# Patient Record
Sex: Male | Born: 1965 | Race: White | Hispanic: No | Marital: Single | Smoking: Never smoker
Health system: Southern US, Community
[De-identification: ages and names within clinical notes are randomized; demographics above are authoritative.]

## PROBLEM LIST (undated history)

## (undated) DIAGNOSIS — D518 Other vitamin B12 deficiency anemias: Principal | ICD-10-CM

## (undated) DIAGNOSIS — K603 Anal fistula, unspecified: Secondary | ICD-10-CM

## (undated) DIAGNOSIS — F419 Anxiety disorder, unspecified: Secondary | ICD-10-CM

## (undated) DIAGNOSIS — Z8719 Personal history of other diseases of the digestive system: Secondary | ICD-10-CM

## (undated) DIAGNOSIS — F32A Depression, unspecified: Secondary | ICD-10-CM

## (undated) DIAGNOSIS — K589 Irritable bowel syndrome without diarrhea: Secondary | ICD-10-CM

## (undated) DIAGNOSIS — K611 Rectal abscess: Secondary | ICD-10-CM

## (undated) DIAGNOSIS — K5090 Crohn's disease, unspecified, without complications: Secondary | ICD-10-CM

## (undated) DIAGNOSIS — F329 Major depressive disorder, single episode, unspecified: Secondary | ICD-10-CM

## (undated) DIAGNOSIS — E559 Vitamin D deficiency, unspecified: Secondary | ICD-10-CM

## (undated) HISTORY — DX: Depression, unspecified: F32.A

## (undated) HISTORY — PX: OTHER SURGICAL HISTORY: SHX169

## (undated) HISTORY — DX: Major depressive disorder, single episode, unspecified: F32.9

## (undated) HISTORY — DX: Personal history of other diseases of the digestive system: Z87.19

## (undated) HISTORY — DX: Irritable bowel syndrome, unspecified: K58.9

## (undated) HISTORY — DX: Crohn's disease, unspecified, without complications: K50.90

## (undated) HISTORY — DX: Anal fistula: K60.3

## (undated) HISTORY — DX: Anxiety disorder, unspecified: F41.9

## (undated) HISTORY — DX: Anal fistula, unspecified: K60.30

## (undated) HISTORY — PX: ADENOIDECTOMY: SUR15

## (undated) HISTORY — DX: Rectal abscess: K61.1

## (undated) HISTORY — DX: Vitamin D deficiency, unspecified: E55.9

## (undated) HISTORY — DX: Other vitamin B12 deficiency anemias: D51.8

---

## 1997-01-04 HISTORY — PX: SMALL INTESTINE SURGERY: SHX150

## 1998-09-08 ENCOUNTER — Encounter: Payer: Self-pay | Admitting: Emergency Medicine

## 1998-09-08 ENCOUNTER — Emergency Department (HOSPITAL_COMMUNITY): Admission: EM | Admit: 1998-09-08 | Discharge: 1998-09-08 | Payer: Self-pay | Admitting: Emergency Medicine

## 2000-07-02 ENCOUNTER — Inpatient Hospital Stay (HOSPITAL_COMMUNITY): Admission: EM | Admit: 2000-07-02 | Discharge: 2000-07-04 | Payer: Self-pay | Admitting: Emergency Medicine

## 2000-07-02 ENCOUNTER — Encounter: Payer: Self-pay | Admitting: Gastroenterology

## 2000-08-13 ENCOUNTER — Ambulatory Visit (HOSPITAL_COMMUNITY): Admission: RE | Admit: 2000-08-13 | Discharge: 2000-08-13 | Payer: Self-pay | Admitting: Gastroenterology

## 2000-08-13 ENCOUNTER — Encounter (INDEPENDENT_AMBULATORY_CARE_PROVIDER_SITE_OTHER): Payer: Self-pay | Admitting: Specialist

## 2000-08-20 ENCOUNTER — Ambulatory Visit (HOSPITAL_COMMUNITY): Admission: RE | Admit: 2000-08-20 | Discharge: 2000-08-20 | Payer: Self-pay | Admitting: Gastroenterology

## 2000-08-20 ENCOUNTER — Encounter: Payer: Self-pay | Admitting: Gastroenterology

## 2000-11-15 ENCOUNTER — Emergency Department (HOSPITAL_COMMUNITY): Admission: EM | Admit: 2000-11-15 | Discharge: 2000-11-15 | Payer: Self-pay

## 2001-03-24 ENCOUNTER — Encounter: Payer: Self-pay | Admitting: Gastroenterology

## 2001-03-24 ENCOUNTER — Ambulatory Visit (HOSPITAL_COMMUNITY): Admission: RE | Admit: 2001-03-24 | Discharge: 2001-03-24 | Payer: Self-pay | Admitting: Gastroenterology

## 2001-10-28 ENCOUNTER — Ambulatory Visit (HOSPITAL_COMMUNITY): Admission: RE | Admit: 2001-10-28 | Discharge: 2001-10-28 | Payer: Self-pay | Admitting: Gastroenterology

## 2001-10-28 ENCOUNTER — Encounter: Payer: Self-pay | Admitting: Gastroenterology

## 2001-12-09 ENCOUNTER — Encounter: Payer: Self-pay | Admitting: Surgery

## 2001-12-16 ENCOUNTER — Inpatient Hospital Stay (HOSPITAL_COMMUNITY): Admission: RE | Admit: 2001-12-16 | Discharge: 2001-12-23 | Payer: Self-pay | Admitting: Surgery

## 2001-12-16 ENCOUNTER — Encounter (INDEPENDENT_AMBULATORY_CARE_PROVIDER_SITE_OTHER): Payer: Self-pay | Admitting: Specialist

## 2001-12-16 ENCOUNTER — Encounter: Payer: Self-pay | Admitting: Otolaryngology

## 2001-12-16 HISTORY — PX: ILEOCECETOMY: SHX5857

## 2003-03-23 ENCOUNTER — Encounter (INDEPENDENT_AMBULATORY_CARE_PROVIDER_SITE_OTHER): Payer: Self-pay | Admitting: Specialist

## 2003-03-23 ENCOUNTER — Ambulatory Visit (HOSPITAL_COMMUNITY): Admission: RE | Admit: 2003-03-23 | Discharge: 2003-03-23 | Payer: Self-pay | Admitting: Gastroenterology

## 2004-03-15 ENCOUNTER — Emergency Department (HOSPITAL_COMMUNITY): Admission: EM | Admit: 2004-03-15 | Discharge: 2004-03-15 | Payer: Self-pay | Admitting: Emergency Medicine

## 2004-04-15 ENCOUNTER — Emergency Department (HOSPITAL_COMMUNITY): Admission: EM | Admit: 2004-04-15 | Discharge: 2004-04-16 | Payer: Self-pay | Admitting: Emergency Medicine

## 2005-12-13 ENCOUNTER — Emergency Department (HOSPITAL_COMMUNITY): Admission: EM | Admit: 2005-12-13 | Discharge: 2005-12-13 | Payer: Self-pay | Admitting: Emergency Medicine

## 2006-11-08 ENCOUNTER — Encounter: Admission: RE | Admit: 2006-11-08 | Discharge: 2006-11-08 | Payer: Self-pay | Admitting: Gastroenterology

## 2007-05-03 ENCOUNTER — Emergency Department (HOSPITAL_COMMUNITY): Admission: EM | Admit: 2007-05-03 | Discharge: 2007-05-03 | Payer: Self-pay | Admitting: Emergency Medicine

## 2009-07-14 ENCOUNTER — Inpatient Hospital Stay (HOSPITAL_COMMUNITY): Admission: EM | Admit: 2009-07-14 | Discharge: 2009-07-17 | Payer: Self-pay | Admitting: Emergency Medicine

## 2010-02-16 ENCOUNTER — Emergency Department (HOSPITAL_COMMUNITY): Admission: EM | Admit: 2010-02-16 | Discharge: 2010-02-16 | Payer: Self-pay | Admitting: Family Medicine

## 2010-07-27 LAB — POCT I-STAT, CHEM 8
BUN: 10 mg/dL (ref 6–23)
Calcium, Ion: 1.13 mmol/L (ref 1.12–1.32)
Chloride: 105 mEq/L (ref 96–112)
Creatinine, Ser: 1.1 mg/dL (ref 0.4–1.5)
Glucose, Bld: 128 mg/dL — ABNORMAL HIGH (ref 70–99)
HCT: 51 % (ref 39.0–52.0)
Hemoglobin: 17.3 g/dL — ABNORMAL HIGH (ref 13.0–17.0)
Potassium: 3.4 mEq/L — ABNORMAL LOW (ref 3.5–5.1)
Sodium: 136 mEq/L (ref 135–145)
TCO2: 27 mmol/L (ref 0–100)

## 2010-07-27 LAB — COMPREHENSIVE METABOLIC PANEL
ALT: 26 U/L (ref 0–53)
ALT: 32 U/L (ref 0–53)
ALT: 40 U/L (ref 0–53)
AST: 20 U/L (ref 0–37)
AST: 23 U/L (ref 0–37)
AST: 27 U/L (ref 0–37)
Albumin: 3.2 g/dL — ABNORMAL LOW (ref 3.5–5.2)
Albumin: 3.2 g/dL — ABNORMAL LOW (ref 3.5–5.2)
Albumin: 3.3 g/dL — ABNORMAL LOW (ref 3.5–5.2)
Alkaline Phosphatase: 44 U/L (ref 39–117)
Alkaline Phosphatase: 46 U/L (ref 39–117)
Alkaline Phosphatase: 53 U/L (ref 39–117)
BUN: 13 mg/dL (ref 6–23)
BUN: 8 mg/dL (ref 6–23)
BUN: 9 mg/dL (ref 6–23)
CO2: 24 mEq/L (ref 19–32)
CO2: 24 mEq/L (ref 19–32)
CO2: 25 mEq/L (ref 19–32)
Calcium: 8.4 mg/dL (ref 8.4–10.5)
Calcium: 8.5 mg/dL (ref 8.4–10.5)
Calcium: 8.6 mg/dL (ref 8.4–10.5)
Chloride: 105 mEq/L (ref 96–112)
Chloride: 105 mEq/L (ref 96–112)
Chloride: 105 mEq/L (ref 96–112)
Creatinine, Ser: 0.96 mg/dL (ref 0.4–1.5)
Creatinine, Ser: 1.02 mg/dL (ref 0.4–1.5)
Creatinine, Ser: 1.14 mg/dL (ref 0.4–1.5)
GFR calc Af Amer: >60 mL/min (ref >60)
GFR calc Af Amer: >60 mL/min (ref >60)
GFR calc Af Amer: >60 mL/min (ref >60)
GFR calc non Af Amer: >60 mL/min (ref >60)
GFR calc non Af Amer: >60 mL/min (ref >60)
GFR calc non Af Amer: >60 mL/min (ref >60)
Glucose, Bld: 121 mg/dL — ABNORMAL HIGH (ref 70–99)
Glucose, Bld: 150 mg/dL — ABNORMAL HIGH (ref 70–99)
Glucose, Bld: 152 mg/dL — ABNORMAL HIGH (ref 70–99)
Potassium: 3.4 mEq/L — ABNORMAL LOW (ref 3.5–5.1)
Potassium: 3.6 mEq/L (ref 3.5–5.1)
Potassium: 3.9 mEq/L (ref 3.5–5.1)
Sodium: 135 mEq/L (ref 135–145)
Sodium: 137 mEq/L (ref 135–145)
Sodium: 139 mEq/L (ref 135–145)
Total Bilirubin: 0.6 mg/dL (ref 0.3–1.2)
Total Bilirubin: 0.6 mg/dL (ref 0.3–1.2)
Total Bilirubin: 1 mg/dL (ref 0.3–1.2)
Total Protein: 6.7 g/dL (ref 6.0–8.3)
Total Protein: 6.7 g/dL (ref 6.0–8.3)
Total Protein: 7.1 g/dL (ref 6.0–8.3)

## 2010-07-27 LAB — URINALYSIS, MICROSCOPIC ONLY
Bilirubin Urine: NEGATIVE
Glucose, UA: NEGATIVE mg/dL
Hgb urine dipstick: NEGATIVE
Ketones, ur: NEGATIVE mg/dL
Leukocytes, UA: NEGATIVE
Nitrite: NEGATIVE
Protein, ur: NEGATIVE mg/dL
Specific Gravity, Urine: >1.046 — ABNORMAL HIGH (ref 1.005–1.030)
Urobilinogen, UA: 0.2 mg/dL (ref 0.0–1.0)
pH: 5.5 (ref 5.0–8.0)

## 2010-07-27 LAB — DIFFERENTIAL
Basophils Absolute: 0 10*3/uL (ref 0.0–0.1)
Basophils Relative: 0 % (ref 0–1)
Eosinophils Absolute: 0 10*3/uL (ref 0.0–0.7)
Eosinophils Relative: 0 % (ref 0–5)
Lymphocytes Relative: 10 % — ABNORMAL LOW (ref 12–46)
Lymphs Abs: 1 10*3/uL (ref 0.7–4.0)
Monocytes Absolute: 0.6 10*3/uL (ref 0.1–1.0)
Monocytes Relative: 5 % (ref 3–12)
Neutro Abs: 9.2 10*3/uL — ABNORMAL HIGH (ref 1.7–7.7)
Neutrophils Relative %: 85 % — ABNORMAL HIGH (ref 43–77)

## 2010-07-27 LAB — CBC
HCT: 38.9 % — ABNORMAL LOW (ref 39.0–52.0)
HCT: 39.9 % (ref 39.0–52.0)
HCT: 43 % (ref 39.0–52.0)
HCT: 49.7 % (ref 39.0–52.0)
Hemoglobin: 12.9 g/dL — ABNORMAL LOW (ref 13.0–17.0)
Hemoglobin: 13 g/dL (ref 13.0–17.0)
Hemoglobin: 14.2 g/dL (ref 13.0–17.0)
Hemoglobin: 16.2 g/dL (ref 13.0–17.0)
MCHC: 32.6 g/dL (ref 30.0–36.0)
MCHC: 32.6 g/dL (ref 30.0–36.0)
MCHC: 33 g/dL (ref 30.0–36.0)
MCHC: 33.1 g/dL (ref 30.0–36.0)
MCV: 86.7 fL (ref 78.0–100.0)
MCV: 87.7 fL (ref 78.0–100.0)
MCV: 88.1 fL (ref 78.0–100.0)
MCV: 88.2 fL (ref 78.0–100.0)
Platelets: 174 10*3/uL (ref 150–400)
Platelets: 176 10*3/uL (ref 150–400)
Platelets: 188 10*3/uL (ref 150–400)
Platelets: 226 10*3/uL (ref 150–400)
RBC: 4.42 MIL/uL (ref 4.22–5.81)
RBC: 4.52 MIL/uL (ref 4.22–5.81)
RBC: 4.9 MIL/uL (ref 4.22–5.81)
RBC: 5.73 MIL/uL (ref 4.22–5.81)
RDW: 13.7 % (ref 11.5–15.5)
RDW: 14.2 % (ref 11.5–15.5)
RDW: 14.2 % (ref 11.5–15.5)
RDW: 14.4 % (ref 11.5–15.5)
WBC: 10.8 10*3/uL — ABNORMAL HIGH (ref 4.0–10.5)
WBC: 6.2 10*3/uL (ref 4.0–10.5)
WBC: 9.6 10*3/uL (ref 4.0–10.5)
WBC: 9.8 10*3/uL (ref 4.0–10.5)

## 2010-07-27 LAB — HEMOCCULT GUIAC POC 1CARD (OFFICE): Fecal Occult Bld: NEGATIVE

## 2010-07-27 LAB — HEPATIC FUNCTION PANEL
ALT: 59 U/L — ABNORMAL HIGH (ref 0–53)
AST: 45 U/L — ABNORMAL HIGH (ref 0–37)
Albumin: 4.4 g/dL (ref 3.5–5.2)
Alkaline Phosphatase: 69 U/L (ref 39–117)
Bilirubin, Direct: 0.2 mg/dL (ref 0.0–0.3)
Indirect Bilirubin: 0.8 mg/dL (ref 0.3–0.9)
Total Bilirubin: 1 mg/dL (ref 0.3–1.2)
Total Protein: 8.5 g/dL — ABNORMAL HIGH (ref 6.0–8.3)

## 2010-07-27 LAB — LIPASE, BLOOD: Lipase: 29 U/L (ref 11–59)

## 2010-09-21 NOTE — Discharge Summary (Signed)
[facility]  Patient:    Greg [name], Greg [name]                     MRN: [id number] Adm. Date:  [id number] Disc. Date: [id number] Attending:  Ernie Avena CC:         Fenton Malling. Lucia Gaskins, M.D.   Discharge Summary  FINAL DIAGNOSES: 1. Crohns ileitis. 2. Abdominal pain, nausea and vomiting, secondary to #1. 3. Status post resection of terminal ileum and cecum several years ago.  CONSULTATIONS:  None.  PROCEDURES:  CT scan of abdomen and pelvis.  COMPLICATIONS:  None.  LABORATORY DATA:  Blood cultures negative.  Admission white count 13,300, fell to 5300 overnight.  Hemoglobin 13.8, following hydration.  Platelets 258,000. Differential count 89 polys and 7 lymphs on admission.  Urinalysis clear. Occult blood negative.  Urine culture negative.  Chemistry panel pertinent for glucose of 118 on admission, and 147 the following morning after being started on steroids.  Albumin 3.0 following hydration overnight.  Liver chemistries normal except for total bilirubin of 1.3 on admission which fell to 0.8 after overnight hydration.  HOSPITAL COURSE:  Greg [name] came to the emergency room late in the evening because of a roughly eight hour history of increasingly severe diffuse mid abdominal pain associated with nausea and one episode of emesis.  He had a low grade fever of 101.4 degrees in the emergency room, and an elevated white count on admission, but a fairly benign abdominal exam.  His plain abdominal films on admission raised the question of a small bowel obstruction since there was little gas distally, and some dilated stepladder type loops of small bowel in the right upper quadrant of the abdomen.  He was treated with NG suction with prompt relief of symptoms overnight after being admitted to the hospital, and was given empiric antibiotics.  We then obtained a CT scan of the abdomen and pelvis which showed evidence for small bowel thickening in the  distal ileum consistent with recurrent Crohns disease, as well as some peri-intestinal inflammatory changes.  There was no evidence of an abscess, nor was there any frank bowel obstruction, in that contrast did make it to the colon.  Therefore, the NG suction was stopped. The antibiotics were stopped, and the patient was started on intravenous steroids with even further clinical improvement.  His diet was able to be rapidly advanced and was well tolerated.  So, discharge was felt to be clinically appropriate.  DISCHARGE DISPOSITION:  The patient is discharged home on a low residue diet and light activity.  He is to call for followup with me in about two weeks, and we will postpone the colonoscopy that had been planned for next week until approximately three weeks from now.  He is to call me anytime if there is pain, nausea, vomiting, or fevers.  DISCHARGE MEDICATIONS: 1. Prednisone 40 mg q.d. starting today, which will be tapered every 3 days by    5 mg until I see him in the office. 2. Percocet #10 with no refills to take one q.3h. p.r.n. pain, although it is    unlikely that he is going to need that medication.  CONDITION ON DISCHARGE:  Improved. DD:  07/04/00 TD:  07/05/00 Job: [id number]

## 2010-09-21 NOTE — Discharge Summary (Signed)
NAME:  Greg [name], Greg [name]                          ACCOUNT NO.:  [id number]   MEDICAL RECORD NO.:  [id number]                   PATIENT TYPE:  INP   LOCATION:  2426                                 FACILITY:  [facility]   PHYSICIAN:  Fenton Malling. Lucia Gaskins, M.D.               DATE OF BIRTH:  [birth year]   DATE OF ADMISSION:  12/16/2001  DATE OF DISCHARGE:  12/23/2001                                 DISCHARGE SUMMARY   CCS NUMBER:  [id number]   DISCHARGE DIAGNOSES:  1. Advanced Crohn's disease of terminal ilium with multiple strictures.  2. Abscess secondary to perforation from Crohn's disease.  3. 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   OPERATIONS PERFORMED:  The patient had a resection of his terminal ileum  (approximately 28 cm) and right colon with side-to-side ileal to transverse  colon anastomoses and stricturoplasty x 1.   HISTORY OF PRESENT ILLNESS:  The patient is a 45 year old white male who has  had longstanding Crohn's disease.  He had a resection of his terminal ileum  in September of 1998 and did well until about the past six months.  He has  been on 6-mercaptopurine during this whole interval course.  Over the last  six months, he has had progressive symptoms of partial bowel obstruction and  increasing abdominal pain with any eating.  According to him and his family,  he has gotten to where he is taking a fair amount of oral pain medicines,  such Vicodin, over the last two to three months.  He has never really felt  well.  He underwent a small bowel series on October 28, 2001, which showed at  least two areas of high-grade stenosis consistent with recurrent Crohn's  disease.   MEDICATIONS:  The main medicine beside the narcotics he is taking has been  6MP and he is taking 100 mg twice a day.  He is taking mepazine and oral  pain medications, such as Vicodin, to control his pain.   PAST MEDICAL HISTORY:  Noncontributory.   HOSPITAL COURSE:  He completed a  mechanical and antibiotic bowel prep at  home and presented to the hospital on December 16, 2001.  He was taken to the  operating room where he underwent abdominal exploration and was found to  have advanced Crohn's and an inflammatory mass of his terminal ileum.  The  mass was probably 5 cm or greater in diameter.  He had an abscess that I got  into which was probably about 5 cm in diameter.  He also had at least three  areas of stricture, each at about 6 cm intervals.   At the operation, I cleaned up the abscess, resected his terminal ileum,  which represented about 28 cm of small bowel, and resected most of his right  colon.  I then did an anastomosis to his transverse colon really right at  the hepatic flexure  with a side-to-side ileal to transverse colon  anastomosis.  Postoperatively, he did well.  I did leave him on cefotetan.  The final culture reports on this abscess revealed both an Escherichia coli  and a Klebsiella pneumoniae sensitive to multiple antibiotics.  Once I got  this report back, I switched him from cefotetan to Cipro as an antibiotic.  Postoperatively on day #1, his hemoglobin was 10, hematocrit 30, and white  blood cell count 10,500.  His creatinine was 0.9 and his potassium was 4.2.  His Foley was removed on the second postoperative day.  His NG tube was  removed on the fourth postoperative day.  He was started on clear liquids on  the fifth postoperative day and advanced to a regular diet the sixth  postoperative day.   His final pathology revealed multifocal mucosal ulceration with transmural  inflammation consistent with inflammatory bowel disease.   He is now seven days postoperative and he is ready for discharge.  He is  eating well, bowels are working well, with no fever, and his wound looks  good.   DISCHARGE MEDICATIONS:  1. Cipro 500 mg b.i.d. for three days to complete a 10-day course of     antibiotics.  2. Darvocet for pain.  3. Ambien 5 mg q.h.s.  p.r.n. for sleep.  4. The patient can resume his Purinethol 100 mg daily.   ACTIVITY:  He should do no driving for two or three days and no heavy  lifting for a month.  He can shower.  He will see me back in two to three  weeks and will then contact Cleotis Nipper, M.D. in probably about four  to six weeks.                                                Fenton Malling. Lucia Gaskins, M.D.    DHN/MEDQ  D:  12/23/2001  T:  12/25/2001  Job:  [id number]   cc:   Cleotis Nipper, M.D.  [address]  [place], [place] [address]  Fax: [phone]

## 2010-09-21 NOTE — Op Note (Signed)
Greg [name], Greg [name]                         ACCOUNT NO.:  [id number]   MEDICAL RECORD NO.:  [id number]                   PATIENT TYPE:  INP   LOCATION:  2122                                 FACILITY:  [facility]   PHYSICIAN:  Fenton Malling. Lucia Gaskins, M.D.               DATE OF BIRTH:  March 30, 1966   DATE OF PROCEDURE:  12/16/2001  DATE OF DISCHARGE:                                 OPERATIVE REPORT   CCS NUMBER:  [id number]   PREOPERATIVE DIAGNOSES:  Crohn's disease of terminal ileum with multiple  strictures and partial obstruction.   POSTOPERATIVE DIAGNOSES:  Crohn's disease of terminal ileum with abscess  formation in the right colonic gutter with multiple strictures and partial  obstruction.   PROCEDURE:  Resection of terminal ileum and right colon with side-to-side  ileo to right transverse colon anastomosis.   SURGEON:  Fenton Malling. Lucia Gaskins, M.D.   FIRST ASSISTANT:  Haywood Lasso, M.D.   ANESTHESIA:  General endotracheal.   ESTIMATED BLOOD LOSS:  1200 cc.   INDICATIONS FOR PROCEDURE:  The patient is a 45 year old white male, who had  a prior resection in September 1998 for Crohn's disease.  At that time, he  had about a 15 cm segment of small bowel resected.  He did well until  probably the last 6-8 months when he had increasing symptoms of partial  bowel obstruction with abdominal pain.  A small bowel series done in June  2003, showed at least two areas of high-grade stricture formation secondary  to recurrent Crohn's.  He now comes for abdominal exploration with planned  resection and stricturoplasty if necessary of his Crohn's segment.   OPERATIVE NOTE:  The patient completed an antibiotic and mechanical bowel  prep at home.  He was given a general endotracheal anesthesia.  His abdomen  was shaved and prepped with Betadine solution.  He had a Foley catheter in  place, a central line in place, was given 2 g of cefotetan at the initiation  of the procedure.   His abdomen was  prepped with Betadine solution and sterile draped.  I went  through his old midline incision into his abdominal cavity.  Abdominal  exploration revealed right and left lobes of liver unremarkable.  The  stomach had an NG tube in proper position.  The gallbladder had no palpable  stones.  What he had was a large, inflammatory mass in his colonic gutter  which represented the terminal ileum which I think had fistulized and formed  an abscess.  It was difficult in  mobilizing this up, and I got into about a  4-5 cm abscess cavity which was cultured.   It looked like the distal 40 cm of his terminal ileum had at least the  inflammatory mass in the terminal ileum and three additional strictured  areas.  These strictures were at intervals of about 6-8 cm.   I ended resecting  about the distal 28-29 cm of small bowel which included  this inflammatory mass and inflammatory bowel with two of the three  strictures.  I took the mesentery down using silk suture ligation.  He had a  remarkably thickened mesentery which ranged from 1-2 cm in thickness.  I  took out the remainder of the right colon up to the hepatic flexure and  really used the right transverse or hepatic flexure as my end of colon which  the colon looked entirely normal before I divided the bowel, and this will  be the distal end of my resection.  So the resected specimen was sent to  pathology.  I tried to stay close to the bowel and did not get into the  retroperitoneum, so I stayed well away from the ureter, but I did not try to  open up the retroperitoneum and identify ureter because of all of the  inflammatory changes.  I then did a stapled side-to-side anastomosis between  the remaining terminal ileum and the transverse colon.  I used the GIA 100  stapler.  I used this because I actually fired it through the last stricture  and started doing a stricturoplasty through the anastomosis.  This allowed a  wide open anastomosis.   Proximal to the anastomosis, there was no obvious  active Crohn's though he did have some fat creeping.  So and again, his  estimated loss of additional bowel was about 28-29 cm, but he seemed to have  a very adequate length of small bowel proximal to where the resection was  done.   I then closed the hole where the GIA stapler had been fired with interrupted  2-0 silk sutures.  I placed some sutures at the heel of the staple and  embrocated the staple line.  I closed the mesentery with 2-0 silk sutures.  I then irrigated the abdomen with about five liters of saline.  Again,  mobilizing up this inflammatory mass was fairly bloody, and out estimated  blood loss was about 1200 cc.  A hemoglobin checked intraoperatively was  about 9.5, and we will check another one postop.  I then completed the  irrigation, checked the NG tube, replaced this anastomotic bowel into sort  of the right gutter.  Again, this was really at the hepatic flexure that he  lost for a little more colon.  I then closed the abdomen with two running #1  PDS sutures with some interrupted Novofils with approximately 6-7 thrown in  place.   The skin was closed with skin gun and Steri-Strips.  The patient tolerated  the procedure well and was transported to the recovery room in good  condition.  Sponge and needle count were correct at the end of the case.  Dr. Delma Post then placed a central line in for me at the end of the case.                                               Fenton Malling. Lucia Gaskins, M.D.    DHN/MEDQ  D:  12/16/2001  T:  12/17/2001  Job:  [id number]   cc:   Cleotis Nipper, M.D.  [address]  [place], [place] [address]  Fax: [phone]

## 2010-09-21 NOTE — Procedures (Signed)
[facility]  Patient:    Greg [name], Greg [name]                     MRN: [id number] Proc. Date: 08/13/00 Adm. Date:  [id number] Disc. Date: [id number] Attending:  Ernie Avena CC:         Fenton Malling. Lucia Gaskins, M.D.   Procedure Report  PROCEDURE:  Colonoscopy with biopsies.  ENDOSCOPIST:  Cleotis Nipper, M.D.  INDICATIONS:  A 45 year old with history of Crohns ileitis, now several years status post resection of terminal ileal stricture with recurrent obstructive symptoms recently after having been off medication for some period of time.  FINDINGS:  Inflammation and marked stenosis at ileocolonic anastomosis.  DESCRIPTION OF PROCEDURE:  The nature, purpose, and risks of the procedure had been discussed with the patient who provided written consent.  The Olympus adult video colonoscope was advanced to the ileocolonic anastomosis.  Sedation was fentanyl 87.5 mcg and Versed 9 mg IV without arrhythmias or desaturations, and digital exam of the prostate was unremarkable.  The anastomosis was characterized by severe ulceration with associated exudate and marked stenosis of the anastomosis, with an aperture that measured about 4 mm or so.  I was able to pass the closed biopsy forceps into the terminal ileum, but that was about it.  Multiple biopsies were obtained.  Pullback was then performed.  The colon was normal without evidence of colitis, ulcerations, erythema, friability, polyps, masses, vascular malformations, or diverticulosis, and retroflexed view into the distal rectum was unremarkable.  The patient tolerated the procedure well, and there were no apparent complications.  IMPRESSION:  Recurrent Crohns ileitis characterized by ulceration and stenosis of the ileocolonic anastomosis.  PLAN: 1. Await pathology. 2. Consider reinstitution of Imuran or Purinethol or possibly consideration.    of surgical resection of this markedly stenotic area.   Also, consider    small-bowel series for updated evaluation of this area. DD:  08/13/00 TD:  08/13/00 Job: [id number]

## 2010-09-21 NOTE — Op Note (Signed)
NAME:  Greg [name], Greg [name]                          ACCOUNT NO.:  [id number]   MEDICAL RECORD NO.:  [id number]                   PATIENT TYPE:  AMB   LOCATION:  ENDO                                 FACILITY:  [facility]   PHYSICIAN:  Ronald Lobo, M.D.                DATE OF BIRTH:  10/20/1965   DATE OF PROCEDURE:  03/23/2003  DATE OF DISCHARGE:                                 OPERATIVE REPORT   PROCEDURE:  Colonoscopy with biopsy.   ENDOSCOPIST:  Ronald Lobo, M.D.   INDICATIONS FOR PROCEDURE:  A 45 year old gentleman with a roughly 11 year  history of Crohn's ileitis status post two ileal resections, most recently  about a year and a half ago, now essentially asymptomatic on 6-MP therapy.   FINDINGS:  Mild activity, characterized by shallow scattered ulcerations, in  the region of the neoterminal ileum.   DESCRIPTION OF PROCEDURE:  The nature, purpose and risk of the procedure  were familiar to the patient from prior examinations and he provided written  consent. Sedation was fentanyl 100 mcg and Versed 10 mg IV prior to and  during the course of the procedure without arrhythmias or desaturation. The  procedure was initiated with the Olympus adjustable tension pediatric video  colonoscope but this lead to too much looping so we switched to the adult  video colonoscope which was able to be advanced into the neoterminal ileum  with the help of some external abdominal compression to control looping.  Pullback was then performed. The quality of the prep was excellent and it is  felt that all areas were well seen.   The neoterminal ileum was entered for a distance of perhaps 5-8 cm or so.  There were some shallow ulcerations present but no obvious deep ulcerations,  serpiginous ulcers or ileal stenosis and no evidence of anastomotic  nodularity or stenosis or ulceration.   The colon was normal. No colitis, polyps, masses, cancer, vascular ectasia  or diverticulosis were noted. There was  no evidence of Crohn's colitis or  proctitis. Retroflexion was not performed in the rectum but perianal  inspection and pullout through the anal canal disclosed no abnormalities.   The patient tolerated the procedure well and there were no apparent  complications.   IMPRESSION:  Mild activity of Crohn's ileitis while on immunosuppressive  therapy.   PLAN:  Await pathology results. For now, continue 6-MP in current dose.                                               Ronald Lobo, M.D.    RB/MEDQ  D:  03/23/2003  T:  03/23/2003  Job:  [id number]   cc:   Fenton Malling. Lucia Gaskins, M.D.  [address] [place]  [place] [address]  Fax: [phone]

## 2010-09-21 NOTE — H&P (Signed)
[facility]  Patient:    Greg [name], Greg [name]                     MRN: [id number] Adm. Date:  [id number] Attending:  Ernie Avena CC:         Fenton Malling. Lucia Gaskins, M.D.   History and Physical  REASON FOR ADMISSION:  This 45 year old white male with history of Crohns disease is being admitted to the hospital because of abdominal pain, nausea, and low-grade fevers with a question of a bowel obstruction.  HISTORY OF PRESENT ILLNESS:  Veleta Miners has been known to have Crohns disease for perhaps the past 8 to 10 years or so and ultimately underwent resection of the terminal ileum several years ago for recurrent obstructions due to associated stricturing. He then did extremely well and eventually was taken off all of his medications. Perhaps, a year ago, he began having diarrhea again. I saw him in the office a few weeks ago. At which time, by my recollection and his, he was Hemoccult positive. It was decided to proceed with colonoscopic evaluation, something which had been scheduled for a couple of a weeks from now.  The event prompting admission tonight, however, is that approximately 8 hours ago he began to feel "bad" which was a few hours after eating chicken at [employer] (no cole slaw). He ate a light supper because he did not quite feel right and shortly after supper began to have fairly intense pain in the periumbilical and mid-abdominal area associated with nausea with one episode of vomiting which was nonbloody in character. He also had some chills.  The patient had recently been treated with Cipro for a UTI at the [facility].  He had two or three bowel movements this morning, none since. That probably represents some reduction in his normal bowel movement frequency compared to what he has been doing for the past year or so.  In the emergency room, he was evaluated by the EDP who noted Hemoccult negative stool, but plain  abdominal films were suggestive of a bowel obstruction. Admission was felt to be clinically appropriate.  In anticipation of placement of an NG tube, based on past experience, he was given intravenous Ativan for sedation.  At the time of my evaluation, the patient was quite somnolent and a little bit confused as a result of the Ativan (the NG tube had not yet been placed, incidentally). Old records are not currently available. His sister helps provide additional history.  ALLERGIES:  No known allergies.  OUTPATIENT MEDICATIONS:  None.  OPERATIONS:  Terminal ileal resection several years ago.  CHRONIC MEDICAL ILLNESSES:  None other than the Crohns disease.  SOCIAL HISTORY/HABITS:  Nonsmoker. No significant or excessive alcohol intake. 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  FAMILY HISTORY:  Not obtained. Not felt to be relevant to the current admission.  REVIEW OF SYSTEMS:  Pertinent for bowel movements on the order of two to fifteen times a day, in a diarrheal fashion, probably progressive in frequency over the past year. No recent weight loss and generally no ongoing recent abdominal pain apart from some degree of crampiness associated with the diarrhea.  PHYSICAL EXAMINATION:  GENERAL:  Veleta Miners is somnolent following Ativan administration. He is not in distress at this time but apparently prior to the Ativan was quite uncomfortable.  VITAL SIGNS:  At presentation to the ER were temperature 101.1, blood pressure 115/74, pulse 111, respirations 30.  HEENT:  The patient is anicteric and without significant conjunctival pallor.  CHEST:  Clear to auscultation.  HEART:  No murmurs, rubs, clicks, gallops, or arrhythmias.  ABDOMEN:  The abdomen is nondistended. Bowel sounds are active with, perhaps, a bit of hyperactivity and a slightly high-pitched character but no rushes, gushes, or tinkles are appreciated. There is a succussion splash present. The abdomen  is soft, and there is only mild diffuse guarding. No rebound, rigidity, or other peritoneal findings.  RECTAL:  By the emergency room physician. By his report, showed heme-negative stool.  NEUROLOGICAL:  The patient is somnolent but without any obvious focal motor or cranial nerves deficits.  LABORATORY DATA:  White count 13,300, hemoglobin 14.6, MCV slightly low at 76, with normal RDW of 12.9, platelets 231,000. Differential count shows 89 polys, 7 lymphs, 3 monocytes. Chemistry panel unremarkable. 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  Three-way abdominal series:  I reviewed the films. It appears that there is some "stepladdering" of the small bowel loops in the left upper quadrant with a paucity of gas in the colon consistent with a bowel obstruction. There is no massive dilatation of the small bowel or extensive stepladdering.  IMPRESSION:  Probable small-bowel obstruction based on pain, nausea, with vomiting, and radiographic appearance in the setting of a patient with prior surgery for Crohns disease. The differential diagnosis would include a Crohns reactivation (although the absence of prodromal obstructive symptoms in the past would tend to go against that diagnosis somewhat); adhesions (possibly a closed loop obstruction given the presence of fever and leukocytosis); or alternatively, the patient could have a nonobstructive process, such as intestinal flu, food poisoning, or some sort of intra-abdominal infection, or even perhaps C. difficile colitis in view of his recent antibiotic exposure.  PLAN: 1. NG suction. 2. Empiric antibiotics. 3. CT scan of the abdomen and pelvis. 4. Surgical consultation if there is not a significant reduction in pain    following NG suction. DD:  07/02/00 TD:  07/02/00 Job: [id number]

## 2010-12-18 ENCOUNTER — Ambulatory Visit (INDEPENDENT_AMBULATORY_CARE_PROVIDER_SITE_OTHER): Payer: BC Managed Care – PPO | Admitting: General Surgery

## 2010-12-18 ENCOUNTER — Encounter (INDEPENDENT_AMBULATORY_CARE_PROVIDER_SITE_OTHER): Payer: Self-pay | Admitting: General Surgery

## 2010-12-18 VITALS — BP 112/82 | HR 58 | Temp 96.0°F

## 2010-12-18 DIAGNOSIS — K611 Rectal abscess: Secondary | ICD-10-CM

## 2010-12-18 DIAGNOSIS — K612 Anorectal abscess: Secondary | ICD-10-CM

## 2010-12-18 NOTE — Progress Notes (Signed)
Chief Complaint  Patient presents with  . Other    pt eval of rectal abscess    HISTORY: Pt has noted pain and swelling on R gluteal region times 1 week.  It had been getting more painful and he went to see Dr. Cristina Gong yesterday.  He thought that he may have had some fevers a few days ago, but these resolved.  He denies changes in bowel habits.  He has longstanding Crohn's disease, but has never had perianal disease.  He has not noted purulent drainage, but has seen bloody drainage when he wiped the area.  Past Medical History  Diagnosis Date  . IBS (irritable bowel syndrome)   . Crohn's disease      Current Outpatient Prescriptions  Medication Sig Dispense Refill  . mercaptopurine (PURINETHOL) 50 MG tablet Take 50 mg by mouth daily. Give on an empty stomach 1 hour before or 2 hours after meals. Caution: Chemotherapy.       . cephALEXin (KEFLEX) 500 MG capsule 500 mg Daily.         No Known Allergies   Family History  Problem Relation Age of Onset  . Cancer Mother     lung  . Other Mother     pulmonary fibrosis     History   Social History  . Marital Status: Single    Spouse Name: N/A    Number of Children: N/A  . Years of Education: N/A   Social History Main Topics  . Smoking status: Never Smoker   . Smokeless tobacco: None  . Alcohol Use: No  . Drug Use: No  . Sexually Active:    Other Topics Concern  . None   Social History Narrative  . None     REVIEW OF SYSTEMS - PERTINENT POSITIVES ONLY: Otherwise negative times 11 systems.     EXAMDanley Danker Vitals:   12/18/10 1619  BP: 112/82  Pulse: 58  Temp: 96 F (35.6 C)    HENT:  Head: Normocephalic and atraumatic.  Eyes: Conjunctivae are normal. Pupils are equal, round, and reactive to light. No scleral icterus.  Neck: Normal range of motion.  Cardiovascular: Normal rate, regular rhythm. Respiratory: Effort normal normal. No respiratory distress.  GI: The abdomen is soft and nontender.     Musculoskeletal: Normal range of motion. Extremities are nontender.  Neurological: Alert and oriented to person, place, and time. Coordination normal.  Skin: Skin is warm and dry. + diaphoresis while discussing I&D. No erythema. No pallor.  Psychiatric: Normal mood and affect. Behavior is normal. Judgment and thought content normal.  Perirectal area on R with pecan sized abscess with fluctuance.  Mild overlying cellulitis.     R Perirectal abscess Recommended bedside I&D, but pt was unwilling to undergo this today or tomorrow in the OR.  He just got started on antibiotics and is hoping this will improve the situation.  I told him that it is possible that the abscess will drain spontaneously, and to do warm soaks and polysporin, but that this is not the best course of treatment.  I advised him that the infection can get worse and spread, or become systemic.  I am going to see him on Monday in order to have short follow up.  He will continue antibiotic prescribed by Dr. Cristina Gong.      Milus Height MD Surgical Oncology, [facility]      Visit Diagnoses: 1. R Perirectal abscess  Primary Care Physician: Janalyn Rouse, MD  Referring.  Dr. Cristina Gong.

## 2010-12-18 NOTE — Assessment & Plan Note (Signed)
Recommended bedside I&D, but pt was unwilling to undergo this today or tomorrow in the OR.  He just got started on antibiotics and is hoping this will improve the situation.  I told him that it is possible that the abscess will drain spontaneously, and to do warm soaks and polysporin, but that this is not the best course of treatment.  I advised him that the infection can get worse and spread, or become systemic.  I am going to see him on Monday in order to have short follow up.  He will continue antibiotic prescribed by Dr. Cristina Gong.

## 2010-12-24 ENCOUNTER — Encounter (INDEPENDENT_AMBULATORY_CARE_PROVIDER_SITE_OTHER): Payer: BC Managed Care – PPO | Admitting: General Surgery

## 2010-12-24 ENCOUNTER — Ambulatory Visit (INDEPENDENT_AMBULATORY_CARE_PROVIDER_SITE_OTHER): Payer: BC Managed Care – PPO | Admitting: General Surgery

## 2010-12-24 DIAGNOSIS — K612 Anorectal abscess: Secondary | ICD-10-CM

## 2010-12-24 NOTE — Progress Notes (Signed)
Subjective:     Patient ID: Greg [name], male   DOB: 03-Jul-1965, 45 y.o.   MRN: [id number]  HPI Pt has been taking antibiotics, doing sitz baths.  He has been experiencing less pain.  He denies fevers/chills.  He has noted purulent drainage from the area.    Review of Systems Otherwise negative.    Objective:   Physical Exam  Constitutional: He is oriented to person, place, and time. He appears well-developed and well-nourished.  HENT:  Head: Normocephalic and atraumatic.  Pulmonary/Chest: Effort normal. No respiratory distress.  Abdominal: Soft.  Genitourinary:          Arrow marks site of drainage.  Neurological: He is alert and oriented to person, place, and time. Coordination normal.  Skin: Skin is warm and dry. No rash noted. No erythema. No pallor.  Psychiatric: He has a normal mood and affect. His behavior is normal. Judgment and thought content normal.       Assessment:     R Perirectal abscess Spontaneously draining. Continue antibiotics, sitz baths. Follow up in 3 weeks. Call if area becoming more painful or firm.

## 2010-12-24 NOTE — Assessment & Plan Note (Signed)
Spontaneously draining. Continue antibiotics, sitz baths. Follow up in 3 weeks. Call if area becoming more painful or firm.

## 2011-03-07 ENCOUNTER — Emergency Department (HOSPITAL_COMMUNITY)
Admission: EM | Admit: 2011-03-07 | Discharge: 2011-03-08 | Discharge status: Against Medical Advice | Payer: BC Managed Care – PPO | Attending: Gastroenterology | Admitting: Gastroenterology

## 2011-03-07 ENCOUNTER — Emergency Department (HOSPITAL_COMMUNITY): Payer: BC Managed Care – PPO

## 2011-03-07 DIAGNOSIS — R10819 Abdominal tenderness, unspecified site: Secondary | ICD-10-CM | POA: Insufficient documentation

## 2011-03-07 DIAGNOSIS — K5090 Crohn's disease, unspecified, without complications: Secondary | ICD-10-CM | POA: Insufficient documentation

## 2011-03-07 DIAGNOSIS — K56609 Unspecified intestinal obstruction, unspecified as to partial versus complete obstruction: Secondary | ICD-10-CM | POA: Insufficient documentation

## 2011-03-07 DIAGNOSIS — R109 Unspecified abdominal pain: Secondary | ICD-10-CM | POA: Insufficient documentation

## 2011-03-07 LAB — CBC
HCT: 47.8 % (ref 39.0–52.0)
Hemoglobin: 16.6 g/dL (ref 13.0–17.0)
MCH: 27.9 pg (ref 26.0–34.0)
MCHC: 34.7 g/dL (ref 30.0–36.0)
MCV: 80.5 fL (ref 78.0–100.0)
Platelets: 208 10*3/uL (ref 150–400)
RBC: 5.94 MIL/uL — ABNORMAL HIGH (ref 4.22–5.81)
RDW: 13.5 % (ref 11.5–15.5)
WBC: 13.9 10*3/uL — ABNORMAL HIGH (ref 4.0–10.5)

## 2011-03-07 LAB — DIFFERENTIAL
Basophils Absolute: 0 10*3/uL (ref 0.0–0.1)
Basophils Relative: 0 % (ref 0–1)
Eosinophils Absolute: 0 10*3/uL (ref 0.0–0.7)
Eosinophils Relative: 0 % (ref 0–5)
Lymphocytes Relative: 6 % — ABNORMAL LOW (ref 12–46)
Lymphs Abs: 0.8 10*3/uL (ref 0.7–4.0)
Monocytes Absolute: 0.8 10*3/uL (ref 0.1–1.0)
Monocytes Relative: 6 % (ref 3–12)
Neutro Abs: 12.3 10*3/uL — ABNORMAL HIGH (ref 1.7–7.7)
Neutrophils Relative %: 89 % — ABNORMAL HIGH (ref 43–77)

## 2011-03-08 ENCOUNTER — Emergency Department (HOSPITAL_COMMUNITY): Payer: BC Managed Care – PPO

## 2011-03-08 ENCOUNTER — Encounter (HOSPITAL_COMMUNITY): Payer: Self-pay

## 2011-03-08 LAB — URINALYSIS, ROUTINE W REFLEX MICROSCOPIC
Bilirubin Urine: NEGATIVE
Nitrite: NEGATIVE
Protein, ur: 30 mg/dL — AB
Urobilinogen, UA: 0.2 mg/dL (ref 0.0–1.0)

## 2011-03-08 LAB — URINE MICROSCOPIC-ADD ON

## 2011-03-08 MED ORDER — IOHEXOL 300 MG/ML  SOLN
100.0000 mL | Freq: Once | INTRAMUSCULAR | Status: AC | PRN
  Administered 2011-03-08: 100 mL via INTRAVENOUS

## 2012-02-18 ENCOUNTER — Encounter (INDEPENDENT_AMBULATORY_CARE_PROVIDER_SITE_OTHER): Payer: Self-pay | Admitting: Surgery

## 2012-02-18 ENCOUNTER — Ambulatory Visit (INDEPENDENT_AMBULATORY_CARE_PROVIDER_SITE_OTHER): Payer: BC Managed Care – PPO | Admitting: Surgery

## 2012-02-18 VITALS — BP 132/80 | HR 72 | Temp 97.5°F | Resp 18 | Ht 73.0 in | Wt 213.8 lb

## 2012-02-18 DIAGNOSIS — K5090 Crohn's disease, unspecified, without complications: Secondary | ICD-10-CM | POA: Insufficient documentation

## 2012-02-18 DIAGNOSIS — K611 Rectal abscess: Secondary | ICD-10-CM

## 2012-02-18 DIAGNOSIS — K612 Anorectal abscess: Secondary | ICD-10-CM

## 2012-02-18 NOTE — Progress Notes (Signed)
[place], MD,  [address]  [place], [place]    [place] Phone:  [phone] FAX:  [phone]   Re:   Greg [name] by "Gray"] DOB:   17-Feb-1966 MRN:   [id number]  ASSESSMENT AND PLAN: 1.  Right peri-rectal fistula  He has had this for one year at least.  I suspect there is underlying rectal Crohn's.  Plan for now:  Antibiotics, sitz baths for flare ups.  I do not see a role for further imaging at this time.  Is it time to update his colonoscopy, with particular attention to the rectum?  I discussed this with Greg [name].  He is on Keflex.  Is metronidazole a "better" antibiotic choice?  I did not change what he is on.  One side effect of metronidazole is peripheral neuropathy (see #4)  He will see me back in 6 months.  1a.  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  2.  Crohn's disease  Resection of terminal ileum - Greg [name] - 2003  3.  Asymptomatic gall stone  Seen on CT scan in 2008 4.  Bilateral foot pain   HISTORY OF PRESENT ILLNESS: Chief Complaint  Patient presents with  . New Evaluation    perianal absc    Greg [name] is a 46 y.o. (DOB: Dec 14, 1965)  white male who is a patient of SHAW,W DOUGLAS, MD and comes to me today for gluteal abscess/infection.  His gastroenterologist is Greg [name] and he spoke to me about the patient.  I last saw Greg [name] in 2008 for some inflammation at the base of his penis.  This has since resolved.  Greg [name] 12/24/2010 for a right peri-anal abscess.  He never had surgery for the peri-anal abscess..  But there is an area in his right peri-anal area that has drained on and off and flairs up at times.  He has some urgency when he has a bowel movement.  He does not have a lot of pain with his BM's.  He has been put on Keflex by Greg [name].  His last colonoscopy was April 2011.  He has no abdominal symptoms.  His appetite is okay.  Current Outpatient  Prescriptions  Medication Sig Dispense Refill  . acetaminophen (TYLENOL) 500 MG tablet Take 1,000 mg by mouth every 6 (six) hours as needed. pain       . 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 His mother is terminally ill and has added stress to him.  PHYSICAL EXAM: BP 132/80  Pulse 72  Temp 97.5 F (36.4 C) (Oral)  Resp 18  Ht 6' 1"  (1.854 m)  Wt 213 lb 12.8 oz (96.979 kg)  BMI 28.21 kg/m2  General: WN WM who is alert and generally healthy appearing.  HEENT: Normal. Pupils equal. Good dentition. Neck: Supple. No mass.   Lungs: Clear to auscultation and symmetric breath sounds. Heart:  RRR. No murmur or rub. Abdomen: Soft. No mass. No tenderness. No hernia. Normal bowel sounds.  Well healed mid line incision. Rectal: Chronic fistula - right anterior rectum, about 4 cm from anal verge.  No obvious abscess to drain or mass.  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  Fairly lax anal sphincter for having a fissure. Extremities:  Good  strength and ROM  in upper and lower extremities. Neurologic:  Grossly intact to motor and sensory function. Psychiatric: Has normal mood and affect. Behavior is normal.   DATA REVIEWED: Notes from Greg [name].  Greg Overall, MD, [place] Office:  [phone]

## 2012-03-04 ENCOUNTER — Other Ambulatory Visit: Payer: Self-pay | Admitting: Physician Assistant

## 2012-05-13 ENCOUNTER — Other Ambulatory Visit: Payer: Self-pay | Admitting: Gastroenterology

## 2012-05-19 ENCOUNTER — Encounter (INDEPENDENT_AMBULATORY_CARE_PROVIDER_SITE_OTHER): Payer: Self-pay

## 2012-06-10 ENCOUNTER — Encounter (INDEPENDENT_AMBULATORY_CARE_PROVIDER_SITE_OTHER): Payer: Self-pay

## 2012-09-17 ENCOUNTER — Ambulatory Visit (INDEPENDENT_AMBULATORY_CARE_PROVIDER_SITE_OTHER): Payer: BC Managed Care – PPO | Admitting: Surgery

## 2012-09-17 ENCOUNTER — Encounter (INDEPENDENT_AMBULATORY_CARE_PROVIDER_SITE_OTHER): Payer: Self-pay | Admitting: Surgery

## 2012-09-17 VITALS — BP 100/68 | HR 74 | Resp 18 | Ht 73.0 in | Wt 207.0 lb

## 2012-09-17 DIAGNOSIS — K603 Anal fistula: Secondary | ICD-10-CM

## 2012-09-17 DIAGNOSIS — K5090 Crohn's disease, unspecified, without complications: Secondary | ICD-10-CM

## 2012-09-20 DIAGNOSIS — K603 Anal fistula: Secondary | ICD-10-CM | POA: Insufficient documentation

## 2012-09-20 NOTE — Progress Notes (Addendum)
[place], MD,  [address]  [place], [place]    [place] Phone:  [phone] FAX:  [phone]   Re:   Greg [name] by "Gray"] DOB:   02/01/1966 MRN:   [id number]  ASSESSMENT AND PLAN: 1.  Right peri-rectal fistula  Originally seen for right peri-anal abscess/fistula by Dr. Barry Dienes in 12/2010.  I saw him 02/18/2012.  He had the right peri-anal fistula, but I was concerned that there was underlying Crohn's in his rectum.  He has since had a colonoscopy in Jan 2014 by Dr. Cristina Gong which shows no rectal Crohn's (though I have yet to talk to Dr. Cristina Gong).  He appears to have an anterior rectal fissure associated with this, but he is tolerating the fissure well.  So the first question is should he have a fistulectomy?  2.  Rectal spasm that seems unrelated to the fistula.  He said that this predates the fistula - in fact, he has had it for years and just tolerated it.  Dr. Cristina Gong raised the question of going to [facility] for rectal manometry.  The second question is how to work up this rectal spasm or do we fix the fistula first and see how he does?  I'll get a second opinion from Dr. Marcello Moores in our practice about further evaluation.  I discussed this with Pearline Cables and he is okay with this approach. ["I saw him today. I think he has multiple fistula. These people don't seem to do well with traditional fistula surgeries. I would recommend setons and a biologic therapy. This has the highest success rate in perianal Crohn's disease." From Dr. Marcello Moores.  DN 10/21/2012]  3.  Crohn's disease  Resection of terminal ileum - D. Nyna Chilton - 2003 4.  Asymptomatic gall stone  Seen on CT scan in 2008   HISTORY OF PRESENT ILLNESS: Chief Complaint  Patient presents with  . Follow-up    Recheck perirectal fistula    Greg [name] is a 47 y.o. (DOB: [birth year])  white male who is a patient of SHAW,W DOUGLAS, MD and comes to me for follow up of a  gluteal abscess/infection.  His gastroenterologist is Dr. Jacinto Reap Buccini and left me a message  about the patient.  I last saw Pearline Cables on 02/18/2012.  He appeared to have a right perianal fistula.  He has sort of tolerated this.  Since I saw him, Dr. Cristina Gong did a colonoscopy in Jan 2014 and according to the patient, he does not have Crohn's involving his rectum.  There are biopsy report (Accession: [id number]) from 05/13/2012 that were taken at colonoscopy:  "Ulcerated benign bowel mucosa with chronic active inflammation" taken from ileocolonic anastomosis and terminal ileum. I do not have recent notes from Dr. Cristina Gong.  Mr. Greg [name] symptoms are that he has 5 - 6 loose stools/day.  He has had this for years, since early in his diagnosis of Crohn's, and he attributes to his Crohn's.  He has been on 6-MP at least 10 years, but because of concerns about malignancies, he is switching to [facility].  He was on [place] over 10 years ago.  His mother died one month ago from cancer and this has weighed on his decision to switch away from 6-MP.  Since the abscess, he has chronic purulent drainage on his underwear from the fistula.  This is consistent with his fistula in ano.  He has had no more abscess flare up, but his  underwear stays stained.  Mr. Man also has some rectal spasm after each BM.  He has had these for years, and they date before the peri-anal abscess.  He said when he has a BM, he'll have 4 to 5 severe rectal/anal spasms that last about 30 seconds.  He feels like he is "pusing a lung our of his rectum".  He's never said much about his before, I think because he has assumed it was due to the Crohn's.  Dr. Cristina Gong raised the question whether he needs to go to [facility] for rectal manometry.  His weight is down about 6 pounds since I last saw him.  He is having no abdominal symptoms.  He clearly has been stressed by the recent death of his mother.  Previous history of peri-anal disease: I last saw Mr. Greg [name] in  2008 for some inflammation at the base of his penis.  This resolved.  Then Mr. Greg [name] saw Dr. Barry Dienes on 12/24/2010 for a right peri-anal abscess.  He never had surgery for the peri-anal abscess..  But there is an area in his right peri-anal area that has drained on and off and flairs up at times.  He has some urgency when he has a bowel movement.  He does not have a lot of pain with his BM's.  His last colonoscopy was 09-26-09.  Current Outpatient Prescriptions  Medication Sig Dispense Refill  . acetaminophen (TYLENOL) 500 MG tablet Take 1,000 mg by mouth every 6 (six) hours as needed. pain       . ALPRAZolam (XANAX) 0.25 MG tablet Take 0.25 mg by mouth at bedtime as needed for sleep.      . cephALEXin (KEFLEX) 500 MG capsule        No current facility-administered medications for this visit.  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 His mother died in 2012/09/26.  PHYSICAL EXAM: BP 100/68  Pulse 74  Resp 18  Ht 6' 1"  (1.854 m)  Wt 207 lb (93.895 kg)  BMI 27.32 kg/m2  General: WN WM who is alert and generally healthy appearing.  HEENT: Normal. Pupils equal.  Neck: Supple. No mass.   Lungs: Clear to auscultation and symmetric breath sounds. Heart:  RRR. No murmur or rub. Abdomen: Soft. No mass. No tenderness. No hernia. Normal bowel sounds.  Well healed mid line incision. Rectal: Chronic fistula - right anterior rectum, about 4 cm from anal verge.  He has changes from moisture around his anus.  He appears to have an anterior anal fissure, but this may be part of the fistula in ano. Extremities:  Good strength and ROM  in upper and lower extremities. Neurologic:  Grossly intact to motor and sensory function. Psychiatric: Has normal mood and affect. Behavior is normal.   DATA REVIEWED: Notes from Dr. Cristina Gong.  Alphonsa Overall, MD, [place] Office:  [phone]

## 2012-09-23 ENCOUNTER — Telehealth (INDEPENDENT_AMBULATORY_CARE_PROVIDER_SITE_OTHER): Payer: Self-pay

## 2012-09-23 NOTE — Telephone Encounter (Signed)
I called and left the pt a message to call me and see if he can come in tomorrow to see Dr Marcello Moores at 2:10, arrive at 1:45 to check in.  His appt right now is 6/12.

## 2012-09-23 NOTE — Telephone Encounter (Signed)
Patient called back and cannot make appointment tomorrow. I told him we will call if another spot opens and to just keep his appointment as scheduled.

## 2012-10-15 ENCOUNTER — Ambulatory Visit (INDEPENDENT_AMBULATORY_CARE_PROVIDER_SITE_OTHER): Payer: BC Managed Care – PPO | Admitting: General Surgery

## 2012-10-15 ENCOUNTER — Encounter (INDEPENDENT_AMBULATORY_CARE_PROVIDER_SITE_OTHER): Payer: Self-pay | Admitting: General Surgery

## 2012-10-15 VITALS — BP 122/70 | HR 84 | Temp 98.6°F | Resp 15 | Ht 73.0 in | Wt 207.6 lb

## 2012-10-15 DIAGNOSIS — K50113 Crohn's disease of large intestine with fistula: Secondary | ICD-10-CM

## 2012-10-15 DIAGNOSIS — K632 Fistula of intestine: Secondary | ICD-10-CM

## 2012-10-15 DIAGNOSIS — K5010 Crohn's disease of large intestine without complications: Secondary | ICD-10-CM

## 2012-10-15 NOTE — Progress Notes (Signed)
Chief Complaint  Patient presents with  . New Evaluation    eval rectal fistula / crohns    HISTORY: Greg [name] is a 47 y.o. male who presents to the office with a >36yrh/o Crohn's controlled with mainly 6MP.  He has had 2 resections of his TI region, with the last being in 2002.  He does not have bloody diarrhea.  He has 6-7 BM's a day.  He denies weight loss.  He has been off his meds recently and has just restarted them.  He c/o "rectal spasms" after BM's.  His most recent colonoscopy showed no rectal inflammation but an anastomotic stricture was noted.  He has been followed by my partner Dr NLucia Gaskinsand seen recently for a perianal fistula.  He has asked for my opinion.         Past Medical History  Diagnosis Date  . IBS (irritable bowel syndrome)   . Crohn's disease       Past Surgical History  Procedure Laterality Date  . Resections      done twice        Current Outpatient Prescriptions  Medication Sig Dispense Refill  . acetaminophen (TYLENOL) 500 MG tablet Take 1,000 mg by mouth every 6 (six) hours as needed. pain       . ALPRAZolam (XANAX) 0.25 MG tablet Take 0.25 mg by mouth at bedtime as needed for sleep.      .Marland Kitchenmercaptopurine (PURINETHOL) 50 MG tablet Take 50 mg by mouth daily. Give on an empty stomach 1 hour before or 2 hours after meals. Caution: Chemotherapy.       No current facility-administered medications for this visit.      No Known Allergies    Family History  Problem Relation Age of Onset  . Cancer Mother     lung  . Other Mother     pulmonary fibrosis    History   Social History  . Marital Status: Single    Spouse Name: N/A    Number of Children: N/A  . Years of Education: N/A   Social History Main Topics  . Smoking status: Never Smoker   . Smokeless tobacco: None  . Alcohol Use: No  . Drug Use: No  . Sexually Active:    Other Topics Concern  . None   Social History Narrative  . None      REVIEW OF SYSTEMS - PERTINENT POSITIVES  ONLY: Review of Systems - General ROS: negative for - chills, fever or weight loss Hematological and Lymphatic ROS: negative for - bleeding problems, blood clots or bruising Respiratory ROS: no cough, shortness of breath, or wheezing Cardiovascular ROS: no chest pain or dyspnea on exertion Gastrointestinal ROS: no abdominal pain, change in bowel habits, or black or bloody stools Genito-Urinary ROS: no dysuria, trouble voiding, or hematuria  EXAM: Filed Vitals:   10/15/12 1529  BP: 122/70  Pulse: 84  Temp: 98.6 F (37 C)  Resp: 15    General appearance: alert and cooperative Resp: clear to auscultation bilaterally Cardio: regular rate and rhythm GI: soft, non-tender; bowel sounds normal; no masses,  no organomegaly Perianal: multiple sites of purulence and fistula ext openings on the R gluteal cleft.  Unable to tolerate a digital internal exam    ASSESSMENT AND PLAN: MJAROM GOVANis a 47y.o. M with Crohn's disease.  I was asked to see him in 2nd opinion for my partner Dr DAlphonsa Overall  He appears to have quite  a bit of perianal irritation on physical exam.  I think his rectal spasms are also at least partially related to his perianal inflammation.  I would suggest that he undergo an EUA and seton placement and then be evaluated by Dr Cristina Gong for biologic therapy.      Rosario Adie, MD Colon and Rectal Surgery / [facility]      Visit Diagnoses: No diagnosis found.  Primary Care Physician: Janalyn Rouse, MD

## 2012-10-15 NOTE — Patient Instructions (Signed)
Schedule an apt with Dr Cristina Gong to discuss biologic treatments.  We will tentatively plan on performing an exam under anesthesia and seton placement.

## 2013-03-15 ENCOUNTER — Encounter: Payer: Self-pay | Admitting: Neurology

## 2013-03-16 ENCOUNTER — Ambulatory Visit (INDEPENDENT_AMBULATORY_CARE_PROVIDER_SITE_OTHER): Payer: BC Managed Care – PPO | Admitting: Neurology

## 2013-03-16 ENCOUNTER — Encounter (INDEPENDENT_AMBULATORY_CARE_PROVIDER_SITE_OTHER): Payer: Self-pay

## 2013-03-16 ENCOUNTER — Encounter: Payer: Self-pay | Admitting: Neurology

## 2013-03-16 VITALS — BP 116/70 | HR 66 | Ht 73.0 in | Wt 210.0 lb

## 2013-03-16 DIAGNOSIS — G63 Polyneuropathy in diseases classified elsewhere: Secondary | ICD-10-CM

## 2013-03-16 DIAGNOSIS — M79609 Pain in unspecified limb: Secondary | ICD-10-CM

## 2013-03-16 DIAGNOSIS — D518 Other vitamin B12 deficiency anemias: Secondary | ICD-10-CM

## 2013-03-16 HISTORY — DX: Other vitamin B12 deficiency anemias: D51.8

## 2013-03-16 NOTE — Progress Notes (Signed)
Reason for visit: Foot pain  Greg [name] is a 47 y.o. male  History of present illness:  Greg [name] is a 47 year old right-handed white male with a history of Crohn's disease. The patient was diagnosed with Crohn disease 20 years ago. The patient had onset of lower extremity discomfort with achy pain throughout the legs prior to the diagnosis of Crohn's disease. The patient has done well until about 4 or 5 years ago when he began having some discomfort on the lateral portions of the feet bilaterally. This has gradually worsened over time, and the pain is now in the entirety of the feet with a dull achy sensation. The pain is usually worse in the evening hours, better when he is up on his feet. The patient has been seen through a rheumatologist, and orthopedic surgeon, and a podiatrist, and x-rays been done on the back and feet, and these were reportedly normal. The patient has had low B12 levels in the past, and he has been on supplementation intermittently. The patient is not currently on B12 supplementation at this time. The patient has been running low normal B12 levels. The patient has had an increase in severity of pain over the last one month. The patient denies any actual numbness of the feet, and he denies any back pain or neck discomfort. The patient denies pain radiating down the arms or legs, and he denies problems with balance or problems controlling the bowels or the bladder. The patient is sent to this office for an evaluation. The patient has been on mercaptopurine for number of years.     Past Medical History  Diagnosis Date  . IBS (irritable bowel syndrome)   . Crohn's disease   . Anxiety   . Perirectal abscess   . Depression   . History of fatty infiltration of liver   . Perianal fistula   . Other vitamin B12 deficiency anemia 03/16/2013  . Vitamin D deficiency     Past Surgical History  Procedure Laterality Date  . Resections      done twice, ileal resections     Family History  Problem Relation Age of Onset  . Cancer Mother     lung  . Other Mother     pulmonary fibrosis    Social history:  reports that he has never smoked. He has never used smokeless tobacco. He reports that he does not drink alcohol or use illicit drugs.  Medications:  Current Outpatient Prescriptions on File Prior to Visit  Medication Sig Dispense Refill  . acetaminophen (TYLENOL) 500 MG tablet Take 1,000 mg by mouth every 6 (six) hours as needed. pain       . ALPRAZolam (XANAX) 0.25 MG tablet Take 0.25 mg by mouth at bedtime as needed for sleep.      . Cholecalciferol (VITAMIN D) 2000 UNITS CAPS Take 2,000 Units by mouth daily.      . mercaptopurine (PURINETHOL) 50 MG tablet Take 100 mg by mouth daily. Give on an empty stomach 1 hour before or 2 hours after meals. Caution: Chemotherapy.       No current facility-administered medications on file prior to visit.     No Known Allergies  ROS:  Out of a complete 14 system review of symptoms, the patient complains only of the following symptoms, and all other reviewed systems are negative.  Foot pain  Blood pressure 116/70, pulse 66, height 6' 1"  (1.854 m), weight 210 lb (95.255 kg).  Physical Exam  General:  The patient is alert and cooperative at the time of the examination.  Head: Pupils are equal, round, and reactive to light. Discs are flat bilaterally.  Neck: The neck is supple, no carotid bruits are noted.  Respiratory: The respiratory examination is clear.  Cardiovascular: The cardiovascular examination reveals a regular rate and rhythm, no obvious murmurs or rubs are noted.  Neuromuscular: The patient has good range of movement of the lumbosacral spine.  Skin: Extremities are without significant edema.  Neurologic Exam  Mental status: The patient is alert and oriented x 3 at the time of the examination.  Cranial nerves: Facial symmetry is present. There is good sensation of the face to pinprick  and soft touch bilaterally. The strength of the facial muscles and the muscles to head turning and shoulder shrug are normal bilaterally. Speech is well enunciated, no aphasia or dysarthria is noted. Extraocular movements are full. Visual fields are full.  Motor: The motor testing reveals 5 over 5 strength of all 4 extremities.The patient is able walk on heels and the toes.  Good symmetric motor tone is noted throughout.  Sensory: Sensory testing is intact to pinprick, soft touch, vibration sensation, and position sense on all 4 extremities. No stocking pattern pinprick sensory deficit is noted.  No evidence of extinction is noted.  Coordination: Cerebellar testing reveals good finger-nose-finger and heel-to-shin bilaterally.  Gait and station: Gait is normal. Tandem gait is normal. Romberg is negative. No drift is seen.  Reflexes: Deep tendon reflexes are symmetric and normal bilaterally. Toes are downgoing bilaterally.   Assessment/Plan:  1. Crohn's disease  2. Vitamin B12 deficiency  3. Vitamin D deficiency  4. Bilateral foot pain  The patient does not have an examination showing clear evidence of a peripheral neuropathy. There are no sensory changes in the feet, only subjective discomfort. The patient has well-maintained reflexes at the ankles. The patient will be set up for nerve conduction studies of both lower extremities, and EMG evaluation will be done on one leg. Blood work will be done today. The patient will be evaluated for copper deficiency, and a methylmalonic acid level will be checked. The patient currently is off of B12 supplementation. He will followup for the EMG evaluation.  Jill Alexanders MD 03/16/2013 9:09 AM  [facility] [address] [place], [place] [address]  Phone [phone] Fax [phone]

## 2013-03-26 ENCOUNTER — Encounter (INDEPENDENT_AMBULATORY_CARE_PROVIDER_SITE_OTHER): Payer: BC Managed Care – PPO

## 2013-03-26 ENCOUNTER — Ambulatory Visit (INDEPENDENT_AMBULATORY_CARE_PROVIDER_SITE_OTHER): Payer: BC Managed Care – PPO | Admitting: Neurology

## 2013-03-26 DIAGNOSIS — D518 Other vitamin B12 deficiency anemias: Secondary | ICD-10-CM

## 2013-03-26 DIAGNOSIS — G5731 Lesion of lateral popliteal nerve, right lower limb: Secondary | ICD-10-CM

## 2013-03-26 DIAGNOSIS — M79609 Pain in unspecified limb: Secondary | ICD-10-CM

## 2013-03-26 DIAGNOSIS — G63 Polyneuropathy in diseases classified elsewhere: Secondary | ICD-10-CM

## 2013-03-26 DIAGNOSIS — G5730 Lesion of lateral popliteal nerve, unspecified lower limb: Secondary | ICD-10-CM

## 2013-03-26 DIAGNOSIS — Z0289 Encounter for other administrative examinations: Secondary | ICD-10-CM

## 2013-03-26 NOTE — Procedures (Signed)
     HISTORY:  Greg [name] is a 47 year old gentleman with a history of Crohn's disease with a 4 or 5 year history of discomfort in the feet in the lateral aspects of the feet. The patient denies any numbness. The patient is being evaluated for a possible neuropathy or a lumbosacral radiculopathy.  NERVE CONDUCTION STUDIES:  Nerve conduction studies were performed on both lower extremities. The distal motor latencies and motor amplitudes for the peroneal and posterior tibial nerves were within normal limits. The nerve conduction velocities for these nerves were also normal. The H reflex latencies were normal. The sensory latencies for the peroneal nerves were within normal limits.   EMG STUDIES:  EMG study was performed on the right lower extremity:  The tibialis anterior muscle reveals 2 to 4K motor units with full recruitment. 2+ fibrillations and positive waves were seen. The peroneus tertius muscle reveals 2 to 5K motor units with decreased recruitment. No fibrillations or positive waves were seen. The medial gastrocnemius muscle reveals 1 to 3K motor units with full recruitment. No fibrillations or positive waves were seen. The vastus lateralis muscle reveals 2 to 4K motor units with full recruitment. No fibrillations or positive waves were seen. The iliopsoas muscle reveals 2 to 4K motor units with full recruitment. No fibrillations or positive waves were seen. The biceps femoris muscle (long head) reveals 2 to 4K motor units with full recruitment. No fibrillations or positive waves were seen. The lumbosacral paraspinal muscles were tested at 3 levels, and revealed no abnormalities of insertional activity at all 3 levels tested. There was good relaxation.  EMG study was performed on the left lower extremity:  The tibialis anterior muscle reveals 2 to 5K motor units with decreased recruitment. No fibrillations or positive waves were seen. The peroneus tertius muscle reveals 2 to 5K  motor units with decreased recruitment. No fibrillations or positive waves were seen. The medial gastrocnemius muscle reveals 1 to 3K motor units with full recruitment. No fibrillations or positive waves were seen. The vastus lateralis muscle reveals 2 to 4K motor units with full recruitment. No fibrillations or positive waves were seen. The iliopsoas muscle reveals 2 to 4K motor units with full recruitment. No fibrillations or positive waves were seen. The biceps femoris muscle (long head) reveals 2 to 4K motor units with full recruitment. No fibrillations or positive waves were seen. The lumbosacral paraspinal muscles were tested at 3 levels, and revealed no abnormalities of insertional activity at all 3 levels tested. There was good relaxation.   IMPRESSION:  Nerve conduction studies done on both lower extremities were unremarkable, without evidence of a peripheral neuropathy. EMG evaluation of the right lower extremity shows acute and chronic denervation within the peroneal nerve distribution, without definite evidence of an overlying lumbosacral radiculopathy. EMG evaluation of the left lower extremity shows primarily chronic changes within the peroneal nerve distribution, without definite evidence of an overlying lumbosacral radiculopathy.  Jill Alexanders MD 03/26/2013 2:09 PM  [facility] [address] [place],  [address]  Phone [phone] Fax [phone]

## 2013-03-26 NOTE — Progress Notes (Signed)
Greg [name] is a 47 year old gentleman with a history dating back for 5 years with discomfort involving the lateral aspects of the feet bilaterally. The patient has a history of Crohn's disease. The patient comes in for EMG and nerve conduction study.  Nerve conduction studies done on both lower extremities were unremarkable, without evidence of a peripheral neuropathy. EMG evaluation of both legs shows denervation that is primarily chronic in the peroneal nerve innervated muscles. No clear evidence of an overlying lumbosacral radiculopathy is seen.  The patient will be sent for MRI evaluation the lumbosacral spine to exclude the possibility of spinal stenosis and bilateral L5 radiculopathies.  Blood work has been done, is pending. The patient will followup if needed.

## 2013-04-12 ENCOUNTER — Telehealth: Payer: Self-pay | Admitting: Neurology

## 2013-04-12 NOTE — Telephone Encounter (Signed)
The patient is to have an MRI of the lumbosacral spine. This was ordered on 03/26/2013. The patient has not heard anything yet, I will try to find out why.

## 2013-04-12 NOTE — Telephone Encounter (Signed)
Called patient concerning the scan that he thought he was going to be set up for. Patient stated that Dr. Jannifer [place] stated that he would have a scan done when he spoke with him at his EMG appt., but he did not know what kind and that Dr. Jannifer [place] would accommodate him being slightly sedated while having this done. Please advise.

## 2013-04-12 NOTE — Telephone Encounter (Signed)
Patient called stating he would like to ask a question about a scan. Please call.

## 2013-04-15 ENCOUNTER — Telehealth: Payer: Self-pay | Admitting: Neurology

## 2013-04-19 ENCOUNTER — Ambulatory Visit (HOSPITAL_COMMUNITY): Payer: BC Managed Care – PPO

## 2013-05-10 ENCOUNTER — Telehealth: Payer: Self-pay | Admitting: Neurology

## 2013-05-10 NOTE — Telephone Encounter (Signed)
I called patient. The patient indicates that after the EMG study he began having some mid thoracic discomfort. The symptoms in the feet have been present for several years, however. We will start by checking the MRI of the lumbosacral spine.

## 2013-05-10 NOTE — Telephone Encounter (Signed)
Please advise 

## 2013-05-10 NOTE — Telephone Encounter (Signed)
Wants someone to give him a call to discuss his upcoming MRI

## 2013-05-12 ENCOUNTER — Telehealth: Payer: Self-pay | Admitting: *Deleted

## 2013-05-12 ENCOUNTER — Ambulatory Visit (HOSPITAL_COMMUNITY)
Admission: RE | Admit: 2013-05-12 | Discharge: 2013-05-12 | Disposition: A | Discharge status: Home / Self Care | Payer: BC Managed Care – PPO | Source: Ambulatory Visit | Attending: Neurology | Admitting: Neurology

## 2013-05-12 DIAGNOSIS — G5731 Lesion of lateral popliteal nerve, right lower limb: Secondary | ICD-10-CM

## 2013-05-12 DIAGNOSIS — M79609 Pain in unspecified limb: Secondary | ICD-10-CM

## 2013-05-12 NOTE — Telephone Encounter (Signed)
Greg [name] called back and I then spoke to pt.   He stated that he has an IV in place, had spoken to Dr. Jannifer [place] and he had stated would order both of these tests and is now not going to do it. (having to go thru process again is costly).    I relayed that I had spoken to Dr. Jannifer [place] about pts request, and he remembered speaking with pt and told him that would start with MRI L spine.  I would be glad to have Dr. Jannifer [place], call there and speak to him.   He handed the phone to [facility], pt was not going to proceed and stated would get another MD.

## 2013-05-12 NOTE — Telephone Encounter (Signed)
Greg [name], calling from [facility].  Pt is there now for MRI L spine.  Pt stating that thought Dr. Jannifer [place] to order T Spine too.  Pt highly claustrophobic.  Consulted Dr. Jannifer [place] and is per note to start with MRI L spine only at this time.   Relayed to [place] and she will inform pt.

## 2013-05-12 NOTE — ED Notes (Signed)
MRI with sedation cx'd per pt. Pt has concerns related to scan ordered and wishes to discuss them with MD prior to going forward today.  MD's office made aware of this, spoke with [place]. Dr. Tobey Grim assistant.  IV discontinued, pt discharged home with family member.  Additional questions denied at this time

## 2013-05-13 DIAGNOSIS — Z0289 Encounter for other administrative examinations: Secondary | ICD-10-CM

## 2013-07-19 ENCOUNTER — Other Ambulatory Visit: Payer: Self-pay | Admitting: Internal Medicine

## 2013-07-19 DIAGNOSIS — R1909 Other intra-abdominal and pelvic swelling, mass and lump: Secondary | ICD-10-CM

## 2013-07-19 DIAGNOSIS — R229 Localized swelling, mass and lump, unspecified: Secondary | ICD-10-CM

## 2013-07-23 ENCOUNTER — Other Ambulatory Visit: Payer: BC Managed Care – PPO

## 2013-07-24 ENCOUNTER — Inpatient Hospital Stay (HOSPITAL_COMMUNITY)
Admission: EM | Admit: 2013-07-24 | Discharge: 2013-07-27 | DRG: 386 | Disposition: A | Discharge status: Home / Self Care | Payer: BC Managed Care – PPO | Attending: Internal Medicine | Admitting: Internal Medicine

## 2013-07-24 ENCOUNTER — Inpatient Hospital Stay (HOSPITAL_COMMUNITY): Payer: BC Managed Care – PPO

## 2013-07-24 ENCOUNTER — Emergency Department (HOSPITAL_COMMUNITY): Payer: BC Managed Care – PPO

## 2013-07-24 ENCOUNTER — Encounter (HOSPITAL_COMMUNITY): Payer: Self-pay | Admitting: Emergency Medicine

## 2013-07-24 DIAGNOSIS — E559 Vitamin D deficiency, unspecified: Secondary | ICD-10-CM | POA: Diagnosis present

## 2013-07-24 DIAGNOSIS — K56609 Unspecified intestinal obstruction, unspecified as to partial versus complete obstruction: Secondary | ICD-10-CM | POA: Diagnosis present

## 2013-07-24 DIAGNOSIS — R1909 Other intra-abdominal and pelvic swelling, mass and lump: Secondary | ICD-10-CM | POA: Diagnosis present

## 2013-07-24 DIAGNOSIS — K5000 Crohn's disease of small intestine without complications: Principal | ICD-10-CM | POA: Diagnosis present

## 2013-07-24 DIAGNOSIS — K603 Anal fistula, unspecified: Secondary | ICD-10-CM | POA: Diagnosis present

## 2013-07-24 DIAGNOSIS — Z9049 Acquired absence of other specified parts of digestive tract: Secondary | ICD-10-CM

## 2013-07-24 DIAGNOSIS — F329 Major depressive disorder, single episode, unspecified: Secondary | ICD-10-CM | POA: Diagnosis present

## 2013-07-24 DIAGNOSIS — R112 Nausea with vomiting, unspecified: Secondary | ICD-10-CM | POA: Diagnosis present

## 2013-07-24 DIAGNOSIS — M79609 Pain in unspecified limb: Secondary | ICD-10-CM | POA: Diagnosis present

## 2013-07-24 DIAGNOSIS — K5669 Other intestinal obstruction: Secondary | ICD-10-CM | POA: Diagnosis present

## 2013-07-24 DIAGNOSIS — E876 Hypokalemia: Secondary | ICD-10-CM | POA: Diagnosis present

## 2013-07-24 DIAGNOSIS — Z9119 Patient's noncompliance with other medical treatment and regimen: Secondary | ICD-10-CM

## 2013-07-24 DIAGNOSIS — Z91199 Patient's noncompliance with other medical treatment and regimen due to unspecified reason: Secondary | ICD-10-CM

## 2013-07-24 DIAGNOSIS — K5090 Crohn's disease, unspecified, without complications: Secondary | ICD-10-CM

## 2013-07-24 DIAGNOSIS — K8020 Calculus of gallbladder without cholecystitis without obstruction: Secondary | ICD-10-CM | POA: Diagnosis present

## 2013-07-24 DIAGNOSIS — R1084 Generalized abdominal pain: Secondary | ICD-10-CM | POA: Diagnosis present

## 2013-07-24 DIAGNOSIS — R1900 Intra-abdominal and pelvic swelling, mass and lump, unspecified site: Secondary | ICD-10-CM | POA: Diagnosis present

## 2013-07-24 DIAGNOSIS — F411 Generalized anxiety disorder: Secondary | ICD-10-CM | POA: Diagnosis present

## 2013-07-24 DIAGNOSIS — F3289 Other specified depressive episodes: Secondary | ICD-10-CM | POA: Diagnosis present

## 2013-07-24 DIAGNOSIS — G8929 Other chronic pain: Secondary | ICD-10-CM | POA: Diagnosis present

## 2013-07-24 DIAGNOSIS — K50912 Crohn's disease, unspecified, with intestinal obstruction: Secondary | ICD-10-CM | POA: Diagnosis present

## 2013-07-24 LAB — CBC WITH DIFFERENTIAL/PLATELET
BASOS PCT: 0 % (ref 0–1)
Basophils Absolute: 0 10*3/uL (ref 0.0–0.1)
Eosinophils Absolute: 0.1 10*3/uL (ref 0.0–0.7)
Eosinophils Relative: 1 % (ref 0–5)
HEMATOCRIT: 49.2 % (ref 39.0–52.0)
HEMOGLOBIN: 17.3 g/dL — AB (ref 13.0–17.0)
LYMPHS ABS: 1.2 10*3/uL (ref 0.7–4.0)
LYMPHS PCT: 7 % — AB (ref 12–46)
MCH: 28.3 pg (ref 26.0–34.0)
MCHC: 35.2 g/dL (ref 30.0–36.0)
MCV: 80.5 fL (ref 78.0–100.0)
MONO ABS: 0.9 10*3/uL (ref 0.1–1.0)
MONOS PCT: 5 % (ref 3–12)
NEUTROS ABS: 14.6 10*3/uL — AB (ref 1.7–7.7)
Neutrophils Relative %: 87 % — ABNORMAL HIGH (ref 43–77)
Platelets: 295 10*3/uL (ref 150–400)
RBC: 6.11 MIL/uL — AB (ref 4.22–5.81)
RDW: 13.5 % (ref 11.5–15.5)
WBC: 16.7 10*3/uL — ABNORMAL HIGH (ref 4.0–10.5)

## 2013-07-24 LAB — COMPREHENSIVE METABOLIC PANEL
ALK PHOS: 100 U/L (ref 39–117)
ALT: 16 U/L (ref 0–53)
AST: 22 U/L (ref 0–37)
Albumin: 4.2 g/dL (ref 3.5–5.2)
BILIRUBIN TOTAL: 0.6 mg/dL (ref 0.3–1.2)
BUN: 12 mg/dL (ref 6–23)
CHLORIDE: 95 meq/L — AB (ref 96–112)
CO2: 25 meq/L (ref 19–32)
CREATININE: 1.08 mg/dL (ref 0.50–1.35)
Calcium: 10.6 mg/dL — ABNORMAL HIGH (ref 8.4–10.5)
GFR calc Af Amer: >90 mL/min (ref >90)
GFR, EST NON AFRICAN AMERICAN: 79 mL/min — AB (ref >90)
GLUCOSE: 139 mg/dL — AB (ref 70–99)
POTASSIUM: 3.6 meq/L — AB (ref 3.7–5.3)
Sodium: 137 mEq/L (ref 137–147)
Total Protein: 9.9 g/dL — ABNORMAL HIGH (ref 6.0–8.3)

## 2013-07-24 LAB — URINALYSIS, ROUTINE W REFLEX MICROSCOPIC
BILIRUBIN URINE: NEGATIVE
Glucose, UA: NEGATIVE mg/dL
KETONES UR: NEGATIVE mg/dL
LEUKOCYTES UA: NEGATIVE
NITRITE: NEGATIVE
PROTEIN: NEGATIVE mg/dL
Specific Gravity, Urine: >1.046 — ABNORMAL HIGH (ref 1.005–1.030)
Urobilinogen, UA: 0.2 mg/dL (ref 0.0–1.0)
pH: 5.5 (ref 5.0–8.0)

## 2013-07-24 LAB — I-STAT CG4 LACTIC ACID, ED: Lactic Acid, Venous: 2.18 mmol/L (ref 0.5–2.2)

## 2013-07-24 LAB — URINE MICROSCOPIC-ADD ON

## 2013-07-24 LAB — LIPASE, BLOOD: LIPASE: 121 U/L — AB (ref 11–59)

## 2013-07-24 MED ORDER — HYDROMORPHONE HCL PF 1 MG/ML IJ SOLN
0.5000 mg | INTRAMUSCULAR | Status: DC | PRN
  Administered 2013-07-27: 0.5 mg via INTRAVENOUS
  Filled 2013-07-24: qty 1

## 2013-07-24 MED ORDER — ENOXAPARIN SODIUM 60 MG/0.6ML ~~LOC~~ SOLN
50.0000 mg | SUBCUTANEOUS | Status: DC
  Administered 2013-07-24 – 2013-07-26 (×3): 50 mg via SUBCUTANEOUS
  Filled 2013-07-24 (×4): qty 0.6

## 2013-07-24 MED ORDER — HYDROMORPHONE HCL PF 1 MG/ML IJ SOLN
0.5000 mg | Freq: Once | INTRAMUSCULAR | Status: AC
  Administered 2013-07-24: 0.5 mg via INTRAVENOUS
  Filled 2013-07-24: qty 1

## 2013-07-24 MED ORDER — ENOXAPARIN SODIUM 40 MG/0.4ML ~~LOC~~ SOLN: 40.0000 mg | SUBCUTANEOUS | Status: DC

## 2013-07-24 MED ORDER — IOHEXOL 300 MG/ML  SOLN
50.0000 mL | Freq: Once | INTRAMUSCULAR | Status: AC | PRN
  Administered 2013-07-24: 50 mL via ORAL

## 2013-07-24 MED ORDER — ONDANSETRON HCL 4 MG/2ML IJ SOLN
4.0000 mg | Freq: Once | INTRAMUSCULAR | Status: AC
  Administered 2013-07-24: 4 mg via INTRAVENOUS
  Filled 2013-07-24: qty 2

## 2013-07-24 MED ORDER — SODIUM CHLORIDE 0.9 % IV SOLN
INTRAVENOUS | Status: DC
  Administered 2013-07-24 – 2013-07-26 (×7): via INTRAVENOUS

## 2013-07-24 MED ORDER — HYDROMORPHONE HCL PF 1 MG/ML IJ SOLN
1.0000 mg | Freq: Once | INTRAMUSCULAR | Status: AC
  Administered 2013-07-24: 1 mg via INTRAVENOUS
  Filled 2013-07-24: qty 1

## 2013-07-24 MED ORDER — METHYLPREDNISOLONE SODIUM SUCC 125 MG IJ SOLR
80.0000 mg | Freq: Three times a day (TID) | INTRAMUSCULAR | Status: DC
  Administered 2013-07-24 – 2013-07-27 (×8): 80 mg via INTRAVENOUS
  Filled 2013-07-24 (×13): qty 1.28

## 2013-07-24 MED ORDER — POTASSIUM CHLORIDE 10 MEQ/100ML IV SOLN
10.0000 meq | INTRAVENOUS | Status: AC
  Administered 2013-07-24 (×4): 10 meq via INTRAVENOUS
  Filled 2013-07-24 (×6): qty 100

## 2013-07-24 MED ORDER — LORAZEPAM 2 MG/ML IJ SOLN
1.0000 mg | Freq: Four times a day (QID) | INTRAMUSCULAR | Status: DC | PRN
  Administered 2013-07-24 – 2013-07-27 (×9): 1 mg via INTRAVENOUS
  Filled 2013-07-24 (×10): qty 1

## 2013-07-24 MED ORDER — SODIUM CHLORIDE 0.9 % IV BOLUS (SEPSIS)
1000.0000 mL | Freq: Once | INTRAVENOUS | Status: AC
  Administered 2013-07-24: 1000 mL via INTRAVENOUS

## 2013-07-24 MED ORDER — ONDANSETRON HCL 4 MG PO TABS: 4.0000 mg | ORAL_TABLET | Freq: Four times a day (QID) | ORAL | Status: DC | PRN

## 2013-07-24 MED ORDER — IOHEXOL 300 MG/ML  SOLN
100.0000 mL | Freq: Once | INTRAMUSCULAR | Status: AC | PRN
Start: 2013-07-24 — End: 2013-07-24
  Administered 2013-07-24: 100 mL via INTRAVENOUS

## 2013-07-24 MED ORDER — LORAZEPAM 2 MG/ML IJ SOLN
1.0000 mg | Freq: Once | INTRAMUSCULAR | Status: AC
  Administered 2013-07-24: 1 mg via INTRAVENOUS
  Filled 2013-07-24: qty 1

## 2013-07-24 MED ORDER — SODIUM CHLORIDE 0.9 % IV SOLN
INTRAVENOUS | Status: DC
  Administered 2013-07-24: 11:00:00 via INTRAVENOUS

## 2013-07-24 MED ORDER — METHYLPREDNISOLONE SODIUM SUCC 125 MG IJ SOLR
125.0000 mg | Freq: Once | INTRAMUSCULAR | Status: AC
  Administered 2013-07-24: 125 mg via INTRAVENOUS
  Filled 2013-07-24: qty 2

## 2013-07-24 MED ORDER — ONDANSETRON HCL 4 MG/2ML IJ SOLN: 4.0000 mg | Freq: Four times a day (QID) | INTRAMUSCULAR | Status: DC | PRN

## 2013-07-24 NOTE — H&P (Signed)
PCP:   Marton Redwood, MD   Chief Complaint:  Abdominal pain  HPI: Greg [name] is a 48 year old white male with a history of Crohn's disease status post ileal resection x2, history of perirectal abscess, and chronic foot pain who presented to the emergency department with sudden onset abdominal pain. Patient has a long-standing history of Crohn's disease which has been relatively stable. He had two terminal ileal resection's in the past, most recently in 2002. At baseline, he has 6-7 bowel movements per day. He has been prescribed 6 MP; however, he has not been taking that recently do to fear of causing cancer.  He denies any blood in the stools. He sees Dr. Cristina Gong about every 3 months. 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  This has been stable.  He has not been seen in my office for several years until this past week when he presented with a right groin swelling which was reminiscent of "knot" that he had several years ago which resolved with antibiotics prescribed by urology. This right groin swelling is larger when he stands. He saw our nurse practitioner who recommended a CT scan but he deferred, preferring to see urology which is scheduled soon. This morning, he awoke suddenly with severe abdominal pain and cramping. He has not had any pain like this recently. He subsequently had multiple episodes of nausea and vomiting. This prompted him to come the emergency department where CT shows bowel obstruction at the small bowel and colon anastomotic site with surrounding inflammation consistent with Crohn's flare. GI and general surgery were consulted and and preferred medical admission. An NG tube was placed. He is being admitted for further management.  Review of Systems:  Review of Systems - All systems were reviewed with the patient and are negative except in history of present illness the following exceptions: Chronic foot pain is  remarkably better with vitamin supplementation. He saw multiple specialists who were unable to diagnose his condition. However, after starting vitamin supplements his pain resolved completely. He has had some sweats today. No fever or chills. All the systems negative to Past Medical History: Past Medical History  Diagnosis Date  . IBS (irritable bowel syndrome)   . Crohn's disease   . Anxiety   . Perirectal abscess   . Depression   . History of fatty infiltration of liver   . Perianal fistula   . Other vitamin B12 deficiency anemia 03/16/2013  . Vitamin D deficiency    Past Surgical History  Procedure Laterality Date  . Resections      done twice, ileal resections    Medications: Prior to Admission medications   Medication Sig Start Date End Date Taking? Authorizing Provider  acetaminophen (TYLENOL) 500 MG tablet Take 1,000 mg by mouth every 6 (six) hours as needed. pain    Yes Historical Provider, MD  Cholecalciferol (VITAMIN D) 2000 UNITS CAPS Take 2,000 Units by mouth daily.   Yes Historical Provider, MD  Multiple Vitamin (MULTIVITAMIN WITH MINERALS) TABS tablet Take 1 tablet by mouth daily.   Yes Historical Provider, MD  traMADol (ULTRAM) 50 MG tablet Take 50 mg by mouth every 6 (six) hours as needed for moderate pain or severe pain.   Yes Historical Provider, MD    Allergies:  No Known Allergies  Social History:  reports that he has never smoked. He has never used smokeless tobacco. He reports that he does not drink alcohol or use  illicit drugs.  Family History: Family History  Problem Relation Age of Onset  . Cancer Mother     lung  . Other Mother     pulmonary fibrosis    Physical Exam: Filed Vitals:   07/24/13 0936 07/24/13 1025 07/24/13 1030 07/24/13 1143  BP: 105/71 122/83  118/73  Pulse: 73 74  70  Temp: 97.3 F (36.3 C)     TempSrc: Oral     Resp: 20 14  18   SpO2: 96% 90% 95% 97%   General appearance: Fatigued, in obvious discomfort; NG tube in  place Head: Normocephalic, without obvious abnormality, atraumatic Eyes: conjunctivae/corneas clear. PERRL, EOM's intact.  Nose: Nares normal. Septum midline. Mucosa normal. No drainage or sinus tenderness. Throat: lips, mucosa, and tongue normal; teeth and gums normal Neck: no adenopathy, no carotid bruit, no JVD and thyroid not enlarged, symmetric, no tenderness/mass/nodules Resp: clear to auscultation bilaterally Cardio: regular rate and rhythm GI: Distended with absent bowel sounds; mild diffuse tenderness; no rebound or guarding  GU: No mass or palpable hernia in the supine position Extremities: extremities normal, atraumatic, no cyanosis or edema; no synovitis Pulses: 2+ and symmetric Lymph nodes: Cervical adenopathy: no cervical lymphadenopathy Neurologic: Alert and oriented X 3, normal strength and tone. Normal symmetric reflexes.     Labs on Admission:   Recent Labs  07/24/13 0949  NA 137  K 3.6*  CL 95*  CO2 25  GLUCOSE 139*  BUN 12  CREATININE 1.08  CALCIUM 10.6*    Recent Labs  07/24/13 0949  AST 22  ALT 16  ALKPHOS 100  BILITOT 0.6  PROT 9.9*  ALBUMIN 4.2    Recent Labs  07/24/13 0949  LIPASE 121*    Recent Labs  07/24/13 0949  WBC 16.7*  NEUTROABS 14.6*  HGB 17.3*  HCT 49.2  MCV 80.5  PLT 295   No results found for this basename: CKTOTAL, CKMB, CKMBINDEX, TROPONINI,  in the last 72 hours No results found for this basename: INR, PROTIME   No results found for this basename: TSH, T4TOTAL, FREET3, T3FREE, THYROIDAB,  in the last 72 hours No results found for this basename: VITAMINB12, FOLATE, FERRITIN, TIBC, IRON, RETICCTPCT,  in the last 72 hours  Radiological Exams on Admission: Ct Abdomen Pelvis W Contrast  07/24/2013   CLINICAL DATA:  Pain, nausea, vomiting  EXAM: CT ABDOMEN AND PELVIS WITH CONTRAST  TECHNIQUE: Multidetector CT imaging of the abdomen and pelvis was performed using the standard protocol following bolus administration of  intravenous contrast.  CONTRAST:  28m OMNIPAQUE IOHEXOL 300 MG/ML SOLN, 1018mOMNIPAQUE IOHEXOL 300 MG/ML SOLN  COMPARISON:  CT ABD/PELVIS W CM dated 03/08/2011  FINDINGS: The lung bases are unremarkable  The liver, spleen, adrenals, pancreas are unremarkable. Benign Bosniak 1 cysts appreciated within the right and left kidneys. Kidneys otherwise unremarkable. Calcified gallstones appreciated within the dependent portion of the gallbladder.  Bowel wall thickening is appreciated at the level of the anastomosis of the small and large bowel image 40 through 49 series 2. There is mild inflammatory change within the adjacent fat. When compared to previous study these findings are less severe. There is no evidence of pneumoperitoneum no associated loculated fluid collections. The small bowel proximal to this finding is dilated. Stable postsurgical changes are appreciated within the right pericolic gutter region. Involving the proximal portion of the ascending colon. Small sub cm reactive mesenteric lymph nodes are identified within the abdomen.  There is no evidence of abdominal or pelvic  masses.  Small amount of free fluid projects within the posterior base of the pelvis, likely reactive.  There is no evidence of abdominal aortic aneurysm. The celiac, SMA, IMA, portal veins are opacified.  There is no evidence of aggressive appearing osseous lesions.  There is no evidence of an abdominal wall nor inguinal hernia.  IMPRESSION: 1. Inflammatory changes and the anastomotic site of the small bowel and colon with an associated small bowel obstruction. Considering the patient's history these findings are consistent with active Crohn's disease. When compared to the previous study the severity of these findings have improved. 2. Bilateral Bosniak type 1 renal cysts 3. Gallstones   Electronically Signed   By: Margaree Mackintosh M.D.   On: 07/24/2013 11:32   No orders found for this or any previous  visit.  Assessment/Plan Principal Problem: 1. Acute Crohn's disease with intestinal obstruction- acute Crohn's flare superimposed on chronic Crohn's disease. Will admit for supportive care for his bowel obstruction. Continued NG suction, n.p.o. status, and IV fluid hydration. Zofran as needed for nausea. GI has been consulted to help direct therapy. Will start on IV Solu-Medrol pending their evaluation. He has been noncompliant with his 6-MP. May want to consider alternative treatment options given his acute flare and chronic perirectal involvement. Defer to GI.  General surgery has been consulted to monitor for surgical needs; hopefully, this will not be necessary with treatment of his Crohn's. Defer further imaging to GI and general surgery.  Active Problems: 2. Crohn's disease, question perirectal involvement-defer management to GI and general surgery 3. Hypokalemia- replete IV 4. Disposition- pending resolution of bowel obstruction inability to tolerate diet.  Marton Redwood 07/24/2013, 1:46 PM

## 2013-07-24 NOTE — ED Notes (Signed)
He c/o sudden onset of generalized abd. Pain which he recognizes as a Crohn's flare with possible "bowel blockage", several of which he has had in the past.  He is here with his sister.  He states he sees Dr. Cristina Gong for his Crohn's.  Upon arrival, he was diaphoretic and writhing.  His skin is now normal, warm and dry and he is much more relaxed in appearance.  All my initial efforts were to establish IV and give meds/draw labs; which I have done.  As I write this, our CT tech. Delivers his contrast.

## 2013-07-24 NOTE — ED Notes (Signed)
Surgery at bs for eval.

## 2013-07-24 NOTE — ED Notes (Signed)
Pt c/o inc pain 5/10 to diffuse abd at this time, EDP aware, awaiting orders.

## 2013-07-24 NOTE — Consult Note (Signed)
Reason for Consult:Crohn's flare up /  SBO Referring Physician: Blanchie Dessert MD  Greg [name] is an 48 y.o. male.  HPI: Asked to see Greg [name] at the request of Dr Maryan Rued for N/V/ abdominal  Pain diffuse since earlier this AM.  Hx of Crohn's dosease for 20 years and has had 2 SB resections in the past.  Has been seen by Dr Lucia Gaskins in the past.  No major flareup recently but has taken 6MP intermittently.  Sees [facility].  No BM today or flatus. Diffuse abdominal pain better with NGT.  Crampy. No blood in stool recently.   Past Medical History  Diagnosis Date  . IBS (irritable bowel syndrome)   . Crohn's disease   . Anxiety   . Perirectal abscess   . Depression   . History of fatty infiltration of liver   . Perianal fistula   . Other vitamin B12 deficiency anemia 03/16/2013  . Vitamin D deficiency     Past Surgical History  Procedure Laterality Date  . Resections      done twice, ileal resections    Family History  Problem Relation Age of Onset  . Cancer Mother     lung  . Other Mother     pulmonary fibrosis    Social History:  reports that he has never smoked. He has never used smokeless tobacco. He reports that he does not drink alcohol or use illicit drugs.  Allergies: No Known Allergies  Medications: I have reviewed the patient's current medications.  Results for orders placed during the hospital encounter of 07/24/13 (from the past 48 hour(s))  CBC WITH DIFFERENTIAL     Status: Abnormal   Collection Time    07/24/13  9:49 AM      Result Value Ref Range   WBC 16.7 (*) 4.0 - 10.5 K/uL   RBC 6.11 (*) 4.22 - 5.81 MIL/uL   Hemoglobin 17.3 (*) 13.0 - 17.0 g/dL   HCT 49.2  39.0 - 52.0 %   MCV 80.5  78.0 - 100.0 fL   MCH 28.3  26.0 - 34.0 pg   MCHC 35.2  30.0 - 36.0 g/dL   RDW 13.5  11.5 - 15.5 %   Platelets 295  150 - 400 K/uL   Neutrophils Relative % 87 (*) 43 - 77 %   Neutro Abs 14.6 (*) 1.7 - 7.7 K/uL   Lymphocytes Relative 7 (*) 12 - 46 %   Lymphs Abs 1.2   0.7 - 4.0 K/uL   Monocytes Relative 5  3 - 12 %   Monocytes Absolute 0.9  0.1 - 1.0 K/uL   Eosinophils Relative 1  0 - 5 %   Eosinophils Absolute 0.1  0.0 - 0.7 K/uL   Basophils Relative 0  0 - 1 %   Basophils Absolute 0.0  0.0 - 0.1 K/uL  COMPREHENSIVE METABOLIC PANEL     Status: Abnormal   Collection Time    07/24/13  9:49 AM      Result Value Ref Range   Sodium 137  137 - 147 mEq/L   Potassium 3.6 (*) 3.7 - 5.3 mEq/L   Chloride 95 (*) 96 - 112 mEq/L   CO2 25  19 - 32 mEq/L   Glucose, Bld 139 (*) 70 - 99 mg/dL   BUN 12  6 - 23 mg/dL   Creatinine, Ser 1.08  0.50 - 1.35 mg/dL   Calcium 10.6 (*) 8.4 - 10.5 mg/dL   Total Protein 9.9 (*)  6.0 - 8.3 g/dL   Albumin 4.2  3.5 - 5.2 g/dL   AST 22  0 - 37 U/L   ALT 16  0 - 53 U/L   Alkaline Phosphatase 100  39 - 117 U/L   Total Bilirubin 0.6  0.3 - 1.2 mg/dL   GFR calc non Af Amer 79 (*) >90 mL/min   GFR calc Af Amer >90  >90 mL/min   Comment: (NOTE)     The eGFR has been calculated using the CKD EPI equation.     This calculation has not been validated in all clinical situations.     eGFR's persistently <90 mL/min signify possible Chronic Kidney     Disease.  LIPASE, BLOOD     Status: Abnormal   Collection Time    07/24/13  9:49 AM      Result Value Ref Range   Lipase 121 (*) 11 - 59 U/L  I-STAT CG4 LACTIC ACID, ED     Status: None   Collection Time    07/24/13 10:10 AM      Result Value Ref Range   Lactic Acid, Venous 2.18  0.5 - 2.2 mmol/L  URINALYSIS, ROUTINE W REFLEX MICROSCOPIC     Status: Abnormal   Collection Time    07/24/13 11:26 AM      Result Value Ref Range   Color, Urine YELLOW  YELLOW   APPearance CLEAR  CLEAR   Specific Gravity, Urine >1.046 (*) 1.005 - 1.030   pH 5.5  5.0 - 8.0   Glucose, UA NEGATIVE  NEGATIVE mg/dL   Hgb urine dipstick TRACE (*) NEGATIVE   Bilirubin Urine NEGATIVE  NEGATIVE   Ketones, ur NEGATIVE  NEGATIVE mg/dL   Protein, ur NEGATIVE  NEGATIVE mg/dL   Urobilinogen, UA 0.2  0.0 - 1.0  mg/dL   Nitrite NEGATIVE  NEGATIVE   Leukocytes, UA NEGATIVE  NEGATIVE  URINE MICROSCOPIC-ADD ON     Status: None   Collection Time    07/24/13 11:26 AM      Result Value Ref Range   RBC / HPF 0-2  <3 RBC/hpf    Ct Abdomen Pelvis W Contrast  07/24/2013   CLINICAL DATA:  Pain, nausea, vomiting  EXAM: CT ABDOMEN AND PELVIS WITH CONTRAST  TECHNIQUE: Multidetector CT imaging of the abdomen and pelvis was performed using the standard protocol following bolus administration of intravenous contrast.  CONTRAST:  4m OMNIPAQUE IOHEXOL 300 MG/ML SOLN, 1028mOMNIPAQUE IOHEXOL 300 MG/ML SOLN  COMPARISON:  CT ABD/PELVIS W CM dated 03/08/2011  FINDINGS: The lung bases are unremarkable  The liver, spleen, adrenals, pancreas are unremarkable. Benign Bosniak 1 cysts appreciated within the right and left kidneys. Kidneys otherwise unremarkable. Calcified gallstones appreciated within the dependent portion of the gallbladder.  Bowel wall thickening is appreciated at the level of the anastomosis of the small and large bowel image 40 through 49 series 2. There is mild inflammatory change within the adjacent fat. When compared to previous study these findings are less severe. There is no evidence of pneumoperitoneum no associated loculated fluid collections. The small bowel proximal to this finding is dilated. Stable postsurgical changes are appreciated within the right pericolic gutter region. Involving the proximal portion of the ascending colon. Small sub cm reactive mesenteric lymph nodes are identified within the abdomen.  There is no evidence of abdominal or pelvic masses.  Small amount of free fluid projects within the posterior base of the pelvis, likely reactive.  There is no evidence  of abdominal aortic aneurysm. The celiac, SMA, IMA, portal veins are opacified.  There is no evidence of aggressive appearing osseous lesions.  There is no evidence of an abdominal wall nor inguinal hernia.  IMPRESSION: 1. Inflammatory  changes and the anastomotic site of the small bowel and colon with an associated small bowel obstruction. Considering the patient's history these findings are consistent with active Crohn's disease. When compared to the previous study the severity of these findings have improved. 2. Bilateral Bosniak type 1 renal cysts 3. Gallstones   Electronically Signed   By: Margaree Mackintosh M.D.   On: 07/24/2013 11:32    Review of Systems  Constitutional: Positive for malaise/fatigue.  HENT: Negative.   Eyes: Negative.   Respiratory: Negative.   Cardiovascular: Negative.   Gastrointestinal: Positive for nausea, vomiting and abdominal pain. Negative for blood in stool.  Genitourinary: Negative.  Negative for dysuria.  Musculoskeletal: Negative.   Skin: Negative.   Neurological: Negative.   Endo/Heme/Allergies: Negative.   Psychiatric/Behavioral: Negative.    Blood pressure 109/79, pulse 82, temperature 97.3 F (36.3 C), temperature source Oral, resp. rate 16, SpO2 96.00%. Physical Exam  Constitutional: He is oriented to person, place, and time. He appears well-developed and well-nourished.  HENT:  Nose: Nose normal.  NGT in place  Eyes: Pupils are equal, round, and reactive to light. No scleral icterus.  Neck: Normal range of motion. Neck supple.  Cardiovascular: Normal rate and regular rhythm.   Respiratory: Effort normal and breath sounds normal.  GI: He exhibits distension. He exhibits no mass. There is tenderness. There is no rigidity, no rebound and no guarding. No hernia.    Neurological: He is alert and oriented to person, place, and time.  Skin: Skin is warm and dry.  Psychiatric: He has a normal mood and affect. His behavior is normal. Judgment and thought content normal.    Assessment/Plan: Crohn's flareup/SBO  Recommend medicine /GI consult admission and medical management.  No acute surgical indication.  NGT/IVF/ Films in am. Will follow.   Jaleea Alesi A. 07/24/2013, 1:54 PM

## 2013-07-24 NOTE — ED Notes (Signed)
Initial Contact - pt resting on stretcher, reports 3/10 diffuse abd pain however sts "my anxiety is bothering me more".  EDP aware of anxiety, pt requesting ativan.  Pt denies other complaints at this time.  Skin PWD.  Speaking full/clear sentences, rr even/un-lab.  Family at bs.  NAD.

## 2013-07-24 NOTE — ED Notes (Signed)
Pt's PCP at bs for eval.

## 2013-07-24 NOTE — ED Provider Notes (Signed)
CSN: [id number]     Arrival date & time 07/24/13  0930 History   First MD Initiated Contact with Patient 07/24/13 [phone]     No chief complaint on file.    (Consider location/radiation/quality/duration/timing/severity/associated sxs/prior Treatment) HPI Comments: Pt has been off mercaptopurine for >39mo  Has diarrhea daily but this am woke up with severe abd pain.  Patient is a 48y.o. male presenting with abdominal pain. The history is provided by the patient.  Abdominal Pain Pain location:  Generalized Pain quality: aching, gnawing, sharp, squeezing and stabbing   Pain radiates to:  Does not radiate Pain severity:  Severe Onset quality:  Sudden Duration:  5 hours Timing:  Constant Progression:  Worsening Chronicity:  Recurrent Context: awakening from sleep   Relieved by:  Nothing Worsened by:  Nothing tried Ineffective treatments:  None tried Associated symptoms: anorexia, diarrhea, dysuria and nausea   Associated symptoms: no fever, no shortness of breath and no vomiting   Associated symptoms comment:  No testicular pain or swelling Risk factors comment:  Hx of multiple bowel obstructions due to crohn's disease and prior surgery.  ileal resection 2 times last >5 years ago   Past Medical History  Diagnosis Date  . IBS (irritable bowel syndrome)   . Crohn's disease   . Anxiety   . Perirectal abscess   . Depression   . History of fatty infiltration of liver   . Perianal fistula   . Other vitamin B12 deficiency anemia 03/16/2013  . Vitamin D deficiency    Past Surgical History  Procedure Laterality Date  . Resections      done twice, ileal resections   Family History  Problem Relation Age of Onset  . Cancer Mother     lung  . Other Mother     pulmonary fibrosis   History  Substance Use Topics  . Smoking status: Never Smoker   . Smokeless tobacco: Never Used  . Alcohol Use: No    Review of Systems  Constitutional: Negative for fever.  Respiratory: Negative  for shortness of breath.   Gastrointestinal: Positive for nausea, abdominal pain, diarrhea and anorexia. Negative for vomiting.  Genitourinary: Positive for dysuria.       Last week noticed a bulge in his groin which was nontender but having some burning with urination for the last few days.  Pt has appointment with urology next week for this  All other systems reviewed and are negative.      Allergies  Review of patient's allergies indicates no known allergies.  Home Medications   Current Outpatient Rx  Name  Route  Sig  Dispense  Refill  . acetaminophen (TYLENOL) 500 MG tablet   Oral   Take 1,000 mg by mouth every 6 (six) hours as needed. pain          . ALPRAZolam (XANAX) 0.25 MG tablet   Oral   Take 0.25 mg by mouth at bedtime as needed for sleep.         . Cholecalciferol (VITAMIN D) 2000 UNITS CAPS   Oral   Take 2,000 Units by mouth daily.         . mercaptopurine (PURINETHOL) 50 MG tablet   Oral   Take 100 mg by mouth daily. Give on an empty stomach 1 hour before or 2 hours after meals. Caution: Chemotherapy.         .Marland KitchenPREVALITE 4 G packet   Oral   Take 4 g by mouth as needed.  BP 105/71  Pulse 73  Temp(Src) 97.3 F (36.3 C) (Oral)  Resp 20  SpO2 96% Physical Exam  Nursing note and vitals reviewed. Constitutional: He is oriented to person, place, and time. He appears well-developed and well-nourished. He appears distressed.  HENT:  Head: Normocephalic and atraumatic.  Mouth/Throat: Oropharynx is clear and moist.  Eyes: Conjunctivae and EOM are normal. Pupils are equal, round, and reactive to light.  Neck: Normal range of motion. Neck supple.  Cardiovascular: Normal rate, regular rhythm and intact distal pulses.   No murmur heard. Pulmonary/Chest: Effort normal and breath sounds normal. No respiratory distress. He has no wheezes. He has no rales.  Abdominal: Soft. He exhibits no distension. Bowel sounds are decreased. There is generalized  tenderness. There is no rebound and no guarding. Hernia confirmed negative in the right inguinal area and confirmed negative in the left inguinal area.  Genitourinary: Testes normal and penis normal. Circumcised. No penile tenderness.  Musculoskeletal: Normal range of motion. He exhibits no edema and no tenderness.  Lymphadenopathy:       Right: No inguinal adenopathy present.       Left: No inguinal adenopathy present.  Neurological: He is alert and oriented to person, place, and time.  Skin: Skin is warm. No rash noted. He is diaphoretic. No erythema.  Psychiatric: He has a normal mood and affect. His behavior is normal.    ED Course  Procedures (including critical care time) Labs Review Labs Reviewed  CBC WITH DIFFERENTIAL - Abnormal; Notable for the following:    WBC 16.7 (*)    RBC 6.11 (*)    Hemoglobin 17.3 (*)    Neutrophils Relative % 87 (*)    Neutro Abs 14.6 (*)    Lymphocytes Relative 7 (*)    All other components within normal limits  COMPREHENSIVE METABOLIC PANEL - Abnormal; Notable for the following:    Potassium 3.6 (*)    Chloride 95 (*)    Glucose, Bld 139 (*)    Calcium 10.6 (*)    Total Protein 9.9 (*)    GFR calc non Af Amer 79 (*)    All other components within normal limits  LIPASE, BLOOD - Abnormal; Notable for the following:    Lipase 121 (*)    All other components within normal limits  URINALYSIS, ROUTINE W REFLEX MICROSCOPIC  I-STAT CG4 LACTIC ACID, ED   Imaging Review Ct Abdomen Pelvis W Contrast  07/24/2013   CLINICAL DATA:  Pain, nausea, vomiting  EXAM: CT ABDOMEN AND PELVIS WITH CONTRAST  TECHNIQUE: Multidetector CT imaging of the abdomen and pelvis was performed using the standard protocol following bolus administration of intravenous contrast.  CONTRAST:  69m OMNIPAQUE IOHEXOL 300 MG/ML SOLN, 1043mOMNIPAQUE IOHEXOL 300 MG/ML SOLN  COMPARISON:  CT ABD/PELVIS W CM dated 03/08/2011  FINDINGS: The lung bases are unremarkable  The liver, spleen,  adrenals, pancreas are unremarkable. Benign Bosniak 1 cysts appreciated within the right and left kidneys. Kidneys otherwise unremarkable. Calcified gallstones appreciated within the dependent portion of the gallbladder.  Bowel wall thickening is appreciated at the level of the anastomosis of the small and large bowel image 40 through 49 series 2. There is mild inflammatory change within the adjacent fat. When compared to previous study these findings are less severe. There is no evidence of pneumoperitoneum no associated loculated fluid collections. The small bowel proximal to this finding is dilated. Stable postsurgical changes are appreciated within the right pericolic gutter region. Involving the proximal portion  of the ascending colon. Small sub cm reactive mesenteric lymph nodes are identified within the abdomen.  There is no evidence of abdominal or pelvic masses.  Small amount of free fluid projects within the posterior base of the pelvis, likely reactive.  There is no evidence of abdominal aortic aneurysm. The celiac, SMA, IMA, portal veins are opacified.  There is no evidence of aggressive appearing osseous lesions.  There is no evidence of an abdominal wall nor inguinal hernia.  IMPRESSION: 1. Inflammatory changes and the anastomotic site of the small bowel and colon with an associated small bowel obstruction. Considering the patient's history these findings are consistent with active Crohn's disease. When compared to the previous study the severity of these findings have improved. 2. Bilateral Bosniak type 1 renal cysts 3. Gallstones   Electronically Signed   By: Margaree Mackintosh M.D.   On: 07/24/2013 11:32     EKG Interpretation None      MDM   Final diagnoses:  SBO (small bowel obstruction)  Crohn's disease    Patient presenting with severe diffuse abdominal pain with a history of multiple obstructions from Crohn's disease and prior surgeries. Patient states his abdominal pain and nausea  are similar to his prior obstruction. He was taking mercaptopurine for his Crohn's disease but he's been off for the last 6 months.  he denies any respiratory or cardiac complaints at this time. He has diffuse abdominal tenderness but no focal areas of tenderness.  Patient did have an episode of diarrhea this morning which he states his stools are always loose.  He appears very uncomfortable on exam.  Also patient states in the last week he had noted a bulge in his groin area and then developed some burning with urination. Today there is no obvious sign of incarcerated hernia and no testicular pain or swelling.  CBC, CMP, lipase, UA, lactic, CT of the abdomen and pelvis ordered. Patient given IV fluids, pain and nausea medication.  10:38 AM Pt's sx improved after IVF, pain and nausea control.  Leukocytosis of 16 and mild elevation of lipase to 121.  LFT's and lactate wnl.  CT pending.  11:54 AM Ct showing a SBO at the anastomosis site from prior surgery.  NGT placed will discuss with surgery first.  12:56 PM Spoke with Dr. Brantley Stage and Dr. Amedeo Plenty who will consult on the pt.  Spoke with Dr. Brigitte Pulse.    Blanchie Dessert, MD 07/24/13 1257

## 2013-07-24 NOTE — ED Notes (Signed)
14FR NGT placed R nare, +placement by auscultation, immed output 926m thick yellow liquid.  NGT secured to nare, pt placed to LWS, procedure tolerated well by pt.  NAD.

## 2013-07-24 NOTE — Consult Note (Addendum)
[facility] Consult Note  Referring Provider: No ref. provider found Primary Care Physician:  Shaw, William, MD Primary Gastroenterologist:  Dr.  Chief Complaint: Abdominal pain, nausea and vomiting HPI: Greg [name] is an 47 y.o. white male  with long-standing 25 year history of Crohn's disease of the terminal ileum, followed by Dr. Buccini for the majority of that time, presents with sudden onset of diffuse wavelike abdominal pain, followed by recurrent nausea and vomiting. He presented to the emergency room and an abdominal CT scan showed thickening and narrowing of the ileocolonic anastomosis with proximal fluid-filled dilatation of the small bowel consistent with small bowel obstruction. He was admitted to the hospital after an NG tube placed and Solu-Medrol started.  The patient has had 2 ileal resections in the past, the last one in 2002. His last colonoscopy 2-1/2 years ago showed scarring of the ileocolonic anastomosis with no active inflammation. He has been on various medications for his Crohn's disease, most prominently 6-mercaptopurine but has off of it several months ago due to concerns about malignant potential given that both of his parents had cancer themselves. He has been considered for biological agents but never started on them. Apparently 5-ASA products were never effective and he was switched from Pentasa to 6-mercaptopurine sometime in the past. He has also had perianal disease which has been managed by surgeons locally and primarily treated with antibiotics especially Flagyl when necessary. He was considered for seton placement one point but ultimately declined. He has had second opinion visits to [place] [facility] and surgery.  Despite not being on any anti-inflammatory medications, the last several months he states he has done about as well as ever with a baseline of 4-6 bowel movements a day and mild manageable perianal symptoms. He has had several  admissions in the past similar to this one for partial small bowel obstruction which have resolved with conservative therapy since his last surgery in 2002.  Past Medical History  Diagnosis Date  . IBS (irritable bowel syndrome)   . Crohn's disease   . Anxiety   . Perirectal abscess   . Depression   . History of fatty infiltration of liver   . Perianal fistula   . Other vitamin B12 deficiency anemia 03/16/2013  . Vitamin D deficiency     Past Surgical History  Procedure Laterality Date  . Resections      done twice, ileal resections     (Not in a hospital admission)  Allergies: No Known Allergies  Family History  Problem Relation Age of Onset  . Cancer Mother     lung  . Other Mother     pulmonary fibrosis    Social History:  reports that he has never smoked. He has never used smokeless tobacco. He reports that he does not drink alcohol or use illicit drugs.  Review of Systems: negative except chronic foot pain for which she has seen multiple practitioners   Blood pressure 109/79, pulse 82, temperature 97.3 F (36.3 C), temperature source Oral, resp. rate 16, SpO2 96.00%. Somewhat somnolent after having received pain medication, otherwise relatively alert and cooperative  Head: Normocephalic, without obvious abnormality, atraumatic Neck: no adenopathy, no carotid bruit, no JVD, supple, symmetrical, trachea midline and thyroid not enlarged, symmetric, no tenderness/mass/nodules Resp: clear to auscultation bilaterally Cardio: regular rate and rhythm, S1, S2 normal, no murmur, click, rub or gallop GI: Abdomen soft nondistended with normoactive bowel sounds, no hepatosplenomegaly mass or guarding Extremities: extremities normal, atraumatic, no cyanosis or   edema  Results for orders placed during the hospital encounter of 07/24/13 (from the past 48 hour(s))  CBC WITH DIFFERENTIAL     Status: Abnormal   Collection Time    07/24/13  9:49 AM      Result Value Ref Range    WBC 16.7 (*) 4.0 - 10.5 K/uL   RBC 6.11 (*) 4.22 - 5.81 MIL/uL   Hemoglobin 17.3 (*) 13.0 - 17.0 g/dL   HCT 49.2  39.0 - 52.0 %   MCV 80.5  78.0 - 100.0 fL   MCH 28.3  26.0 - 34.0 pg   MCHC 35.2  30.0 - 36.0 g/dL   RDW 13.5  11.5 - 15.5 %   Platelets 295  150 - 400 K/uL   Neutrophils Relative % 87 (*) 43 - 77 %   Neutro Abs 14.6 (*) 1.7 - 7.7 K/uL   Lymphocytes Relative 7 (*) 12 - 46 %   Lymphs Abs 1.2  0.7 - 4.0 K/uL   Monocytes Relative 5  3 - 12 %   Monocytes Absolute 0.9  0.1 - 1.0 K/uL   Eosinophils Relative 1  0 - 5 %   Eosinophils Absolute 0.1  0.0 - 0.7 K/uL   Basophils Relative 0  0 - 1 %   Basophils Absolute 0.0  0.0 - 0.1 K/uL  COMPREHENSIVE METABOLIC PANEL     Status: Abnormal   Collection Time    07/24/13  9:49 AM      Result Value Ref Range   Sodium 137  137 - 147 mEq/L   Potassium 3.6 (*) 3.7 - 5.3 mEq/L   Chloride 95 (*) 96 - 112 mEq/L   CO2 25  19 - 32 mEq/L   Glucose, Bld 139 (*) 70 - 99 mg/dL   BUN 12  6 - 23 mg/dL   Creatinine, Ser 1.08  0.50 - 1.35 mg/dL   Calcium 10.6 (*) 8.4 - 10.5 mg/dL   Total Protein 9.9 (*) 6.0 - 8.3 g/dL   Albumin 4.2  3.5 - 5.2 g/dL   AST 22  0 - 37 U/L   ALT 16  0 - 53 U/L   Alkaline Phosphatase 100  39 - 117 U/L   Total Bilirubin 0.6  0.3 - 1.2 mg/dL   GFR calc non Af Amer 79 (*) >90 mL/min   GFR calc Af Amer >90  >90 mL/min   Comment: (NOTE)     The eGFR has been calculated using the CKD EPI equation.     This calculation has not been validated in all clinical situations.     eGFR's persistently <90 mL/min signify possible Chronic Kidney     Disease.  LIPASE, BLOOD     Status: Abnormal   Collection Time    07/24/13  9:49 AM      Result Value Ref Range   Lipase 121 (*) 11 - 59 U/L  I-STAT CG4 LACTIC ACID, ED     Status: None   Collection Time    07/24/13 10:10 AM      Result Value Ref Range   Lactic Acid, Venous 2.18  0.5 - 2.2 mmol/L  URINALYSIS, ROUTINE W REFLEX MICROSCOPIC     Status: Abnormal   Collection Time     07/24/13 11:26 AM      Result Value Ref Range   Color, Urine YELLOW  YELLOW   APPearance CLEAR  CLEAR   Specific Gravity, Urine >1.046 (*) 1.005 - 1.030   pH 5.5  5.0 - 8.0   Glucose, UA NEGATIVE  NEGATIVE mg/dL   Hgb urine dipstick TRACE (*) NEGATIVE   Bilirubin Urine NEGATIVE  NEGATIVE   Ketones, ur NEGATIVE  NEGATIVE mg/dL   Protein, ur NEGATIVE  NEGATIVE mg/dL   Urobilinogen, UA 0.2  0.0 - 1.0 mg/dL   Nitrite NEGATIVE  NEGATIVE   Leukocytes, UA NEGATIVE  NEGATIVE  URINE MICROSCOPIC-ADD ON     Status: None   Collection Time    07/24/13 11:26 AM      Result Value Ref Range   RBC / HPF 0-2  <3 RBC/hpf   Ct Abdomen Pelvis W Contrast  07/24/2013   CLINICAL DATA:  Pain, nausea, vomiting  EXAM: CT ABDOMEN AND PELVIS WITH CONTRAST  TECHNIQUE: Multidetector CT imaging of the abdomen and pelvis was performed using the standard protocol following bolus administration of intravenous contrast.  CONTRAST:  50mL OMNIPAQUE IOHEXOL 300 MG/ML SOLN, 100mL OMNIPAQUE IOHEXOL 300 MG/ML SOLN  COMPARISON:  CT ABD/PELVIS W CM dated 03/08/2011  FINDINGS: The lung bases are unremarkable  The liver, spleen, adrenals, pancreas are unremarkable. Benign Bosniak 1 cysts appreciated within the right and left kidneys. Kidneys otherwise unremarkable. Calcified gallstones appreciated within the dependent portion of the gallbladder.  Bowel wall thickening is appreciated at the level of the anastomosis of the small and large bowel image 40 through 49 series 2. There is mild inflammatory change within the adjacent fat. When compared to previous study these findings are less severe. There is no evidence of pneumoperitoneum no associated loculated fluid collections. The small bowel proximal to this finding is dilated. Stable postsurgical changes are appreciated within the right pericolic gutter region. Involving the proximal portion of the ascending colon. Small sub cm reactive mesenteric lymph nodes are identified within the  abdomen.  There is no evidence of abdominal or pelvic masses.  Small amount of free fluid projects within the posterior base of the pelvis, likely reactive.  There is no evidence of abdominal aortic aneurysm. The celiac, SMA, IMA, portal veins are opacified.  There is no evidence of aggressive appearing osseous lesions.  There is no evidence of an abdominal wall nor inguinal hernia.  IMPRESSION: 1. Inflammatory changes and the anastomotic site of the small bowel and colon with an associated small bowel obstruction. Considering the patient's history these findings are consistent with active Crohn's disease. When compared to the previous study the severity of these findings have improved. 2. Bilateral Bosniak type 1 renal cysts 3. Gallstones   Electronically Signed   By: Hector  Cooper M.D.   On: 07/24/2013 11:32    Assessment: 1. Small bowel obstruction secondary to Crohn's disease, 2. Chronic Crohn's ileitis 3. History of Crohn's perianal disease Plan:  1. Agree with current acute management of IV corticosteroids, NG suction and n.p.o. This is probably partially fibrostenotic Crohn's rather than recurrent inflammation based on the sudden onset and findings on his last colonoscopy. 2. If bowel obstruction responds to the above management, may not need to make any acute decisions regarding additional slower acting medications although could reconsider biologic agents and he has already had his TB skin test in anticipation of this. Resumption of 6-mercaptopurine will be of no help in the acute setting, nor will likely 5 ASA 3. Will check serial KUBs to assess the status of his small bowel obstruction. Clamp MG and Began clear liquids as tolerated. Will follow with you , C 07/24/2013, 2:24 PM    

## 2013-07-24 NOTE — ED Notes (Signed)
Pt transported to floor at this time, NAD upon leaving dept.

## 2013-07-25 LAB — CBC
HEMATOCRIT: 38.9 % — AB (ref 39.0–52.0)
Hemoglobin: 13.2 g/dL (ref 13.0–17.0)
MCH: 27.6 pg (ref 26.0–34.0)
MCHC: 33.9 g/dL (ref 30.0–36.0)
MCV: 81.4 fL (ref 78.0–100.0)
PLATELETS: 210 10*3/uL (ref 150–400)
RBC: 4.78 MIL/uL (ref 4.22–5.81)
RDW: 13.7 % (ref 11.5–15.5)
WBC: 12.2 10*3/uL — AB (ref 4.0–10.5)

## 2013-07-25 LAB — BASIC METABOLIC PANEL
BUN: 12 mg/dL (ref 6–23)
CALCIUM: 8.8 mg/dL (ref 8.4–10.5)
CHLORIDE: 100 meq/L (ref 96–112)
CO2: 24 meq/L (ref 19–32)
CREATININE: 0.88 mg/dL (ref 0.50–1.35)
GFR calc Af Amer: >90 mL/min (ref >90)
GFR calc non Af Amer: >90 mL/min (ref >90)
Glucose, Bld: 148 mg/dL — ABNORMAL HIGH (ref 70–99)
Potassium: 3.9 mEq/L (ref 3.7–5.3)
Sodium: 135 mEq/L — ABNORMAL LOW (ref 137–147)

## 2013-07-25 NOTE — Progress Notes (Signed)
[facility] Progress Note  Subjective: Feels better, no abdominal pain, passed a small amount of stool.  Objective: Vital signs in last 24 hours: Temp:  [97.2 F (36.2 C)-97.8 F (36.6 C)] 97.8 F (36.6 C) (03/22 0600) Pulse Rate:  [65-82] 70 (03/22 0600) Resp:  [14-20] 16 (03/22 0600) BP: (93-123)/(57-83) 93/57 mmHg (03/22 0600) SpO2:  [90 %-97 %] 94 % (03/22 0600) Weight:  [94.7 kg (208 lb 12.4 oz)] 94.7 kg (208 lb 12.4 oz) (03/21 1533) Weight change:    PE: Abdomen soft nontender nondistended  Lab Results: Results for orders placed during the hospital encounter of 07/24/13 (from the past 24 hour(s))  CBC WITH DIFFERENTIAL     Status: Abnormal   Collection Time    07/24/13  9:49 AM      Result Value Ref Range   WBC 16.7 (*) 4.0 - 10.5 K/uL   RBC 6.11 (*) 4.22 - 5.81 MIL/uL   Hemoglobin 17.3 (*) 13.0 - 17.0 g/dL   HCT 49.2  39.0 - 52.0 %   MCV 80.5  78.0 - 100.0 fL   MCH 28.3  26.0 - 34.0 pg   MCHC 35.2  30.0 - 36.0 g/dL   RDW 13.5  11.5 - 15.5 %   Platelets 295  150 - 400 K/uL   Neutrophils Relative % 87 (*) 43 - 77 %   Neutro Abs 14.6 (*) 1.7 - 7.7 K/uL   Lymphocytes Relative 7 (*) 12 - 46 %   Lymphs Abs 1.2  0.7 - 4.0 K/uL   Monocytes Relative 5  3 - 12 %   Monocytes Absolute 0.9  0.1 - 1.0 K/uL   Eosinophils Relative 1  0 - 5 %   Eosinophils Absolute 0.1  0.0 - 0.7 K/uL   Basophils Relative 0  0 - 1 %   Basophils Absolute 0.0  0.0 - 0.1 K/uL  COMPREHENSIVE METABOLIC PANEL     Status: Abnormal   Collection Time    07/24/13  9:49 AM      Result Value Ref Range   Sodium 137  137 - 147 mEq/L   Potassium 3.6 (*) 3.7 - 5.3 mEq/L   Chloride 95 (*) 96 - 112 mEq/L   CO2 25  19 - 32 mEq/L   Glucose, Bld 139 (*) 70 - 99 mg/dL   BUN 12  6 - 23 mg/dL   Creatinine, Ser 1.08  0.50 - 1.35 mg/dL   Calcium 10.6 (*) 8.4 - 10.5 mg/dL   Total Protein 9.9 (*) 6.0 - 8.3 g/dL   Albumin 4.2  3.5 - 5.2 g/dL   AST 22  0 - 37 U/L   ALT 16  0 - 53 U/L   Alkaline  Phosphatase 100  39 - 117 U/L   Total Bilirubin 0.6  0.3 - 1.2 mg/dL   GFR calc non Af Amer 79 (*) >90 mL/min   GFR calc Af Amer >90  >90 mL/min  LIPASE, BLOOD     Status: Abnormal   Collection Time    07/24/13  9:49 AM      Result Value Ref Range   Lipase 121 (*) 11 - 59 U/L  I-STAT CG4 LACTIC ACID, ED     Status: None   Collection Time    07/24/13 10:10 AM      Result Value Ref Range   Lactic Acid, Venous 2.18  0.5 - 2.2 mmol/L  URINALYSIS, ROUTINE W REFLEX MICROSCOPIC     Status: Abnormal  Collection Time    07/24/13 11:26 AM      Result Value Ref Range   Color, Urine YELLOW  YELLOW   APPearance CLEAR  CLEAR   Specific Gravity, Urine >1.046 (*) 1.005 - 1.030   pH 5.5  5.0 - 8.0   Glucose, UA NEGATIVE  NEGATIVE mg/dL   Hgb urine dipstick TRACE (*) NEGATIVE   Bilirubin Urine NEGATIVE  NEGATIVE   Ketones, ur NEGATIVE  NEGATIVE mg/dL   Protein, ur NEGATIVE  NEGATIVE mg/dL   Urobilinogen, UA 0.2  0.0 - 1.0 mg/dL   Nitrite NEGATIVE  NEGATIVE   Leukocytes, UA NEGATIVE  NEGATIVE  URINE MICROSCOPIC-ADD ON     Status: None   Collection Time    07/24/13 11:26 AM      Result Value Ref Range   RBC / HPF 0-2  <3 RBC/hpf  BASIC METABOLIC PANEL     Status: Abnormal   Collection Time    07/25/13  4:12 AM      Result Value Ref Range   Sodium 135 (*) 137 - 147 mEq/L   Potassium 3.9  3.7 - 5.3 mEq/L   Chloride 100  96 - 112 mEq/L   CO2 24  19 - 32 mEq/L   Glucose, Bld 148 (*) 70 - 99 mg/dL   BUN 12  6 - 23 mg/dL   Creatinine, Ser 0.88  0.50 - 1.35 mg/dL   Calcium 8.8  8.4 - 10.5 mg/dL   GFR calc non Af Amer >90  >90 mL/min   GFR calc Af Amer >90  >90 mL/min  CBC     Status: Abnormal   Collection Time    07/25/13  4:12 AM      Result Value Ref Range   WBC 12.2 (*) 4.0 - 10.5 K/uL   RBC 4.78  4.22 - 5.81 MIL/uL   Hemoglobin 13.2  13.0 - 17.0 g/dL   HCT 38.9 (*) 39.0 - 52.0 %   MCV 81.4  78.0 - 100.0 fL   MCH 27.6  26.0 - 34.0 pg   MCHC 33.9  30.0 - 36.0 g/dL   RDW 13.7  11.5  - 15.5 %   Platelets 210  150 - 400 K/uL    Studies/Results: Ct Abdomen Pelvis W Contrast  07/24/2013   CLINICAL DATA:  Pain, nausea, vomiting  EXAM: CT ABDOMEN AND PELVIS WITH CONTRAST  TECHNIQUE: Multidetector CT imaging of the abdomen and pelvis was performed using the standard protocol following bolus administration of intravenous contrast.  CONTRAST:  37m OMNIPAQUE IOHEXOL 300 MG/ML SOLN, 1083mOMNIPAQUE IOHEXOL 300 MG/ML SOLN  COMPARISON:  CT ABD/PELVIS W CM dated 03/08/2011  FINDINGS: The lung bases are unremarkable  The liver, spleen, adrenals, pancreas are unremarkable. Benign Bosniak 1 cysts appreciated within the right and left kidneys. Kidneys otherwise unremarkable. Calcified gallstones appreciated within the dependent portion of the gallbladder.  Bowel wall thickening is appreciated at the level of the anastomosis of the small and large bowel image 40 through 49 series 2. There is mild inflammatory change within the adjacent fat. When compared to previous study these findings are less severe. There is no evidence of pneumoperitoneum no associated loculated fluid collections. The small bowel proximal to this finding is dilated. Stable postsurgical changes are appreciated within the right pericolic gutter region. Involving the proximal portion of the ascending colon. Small sub cm reactive mesenteric lymph nodes are identified within the abdomen.  There is no evidence of abdominal or pelvic masses.  Small amount of free fluid projects within the posterior base of the pelvis, likely reactive.  There is no evidence of abdominal aortic aneurysm. The celiac, SMA, IMA, portal veins are opacified.  There is no evidence of aggressive appearing osseous lesions.  There is no evidence of an abdominal wall nor inguinal hernia.  IMPRESSION: 1. Inflammatory changes and the anastomotic site of the small bowel and colon with an associated small bowel obstruction. Considering the patient's history these findings  are consistent with active Crohn's disease. When compared to the previous study the severity of these findings have improved. 2. Bilateral Bosniak type 1 renal cysts 3. Gallstones   Electronically Signed   By: Margaree Mackintosh M.D.   On: 07/24/2013 11:32   Dg Abd 2 Views  07/24/2013   CLINICAL DATA:  Abdominal pain followup small bowel obstruction  EXAM: ABDOMEN - 2 VIEW  COMPARISON:  CT ABD/PELVIS W CM dated 07/24/2013  FINDINGS: NG tube within the stomach. There are no dilated loops of large or small bowel. Contrast within the bladder from recent CT scan. Multiple surgical clips noted in the right abdomen.  IMPRESSION: No evidence of bowel obstruction.   Electronically Signed   By: Suzy Bouchard M.D.   On: 07/24/2013 18:29      Assessment: Crohn's ileitis with small bowel obstruction, seemingly responding to Solu-Medrol and NG suction  Plan: Continue both NG suction Solu-Medrol 1 more day, repeat KUB in the morning. Consider discontinuing NG suction and begin clear liquid diet tomorrow    Mackinze Criado C 07/25/2013, 9:07 AM

## 2013-07-25 NOTE — Progress Notes (Signed)
Morning blood pressure 93/57 on call provider notified, Dr. Brigitte Pulse. Call responded to notify if SBP <90. No new orders at this time.  Will  continue to monitor.

## 2013-07-25 NOTE — Progress Notes (Signed)
Subjective: Feels much better- had 2 bowel movements of typical yellow liquid consistency for his BM.  Abdominal pain resolved.  NG remains in place.    Objective: Vital signs in last 24 hours: Temp:  [97.2 F (36.2 C)-97.8 F (36.6 C)] 97.8 F (36.6 C) (03/22 0600) Pulse Rate:  [65-82] 70 (03/22 0600) Resp:  [16-18] 16 (03/22 0600) BP: (93-123)/(57-79) 93/57 mmHg (03/22 0600) SpO2:  [94 %-97 %] 94 % (03/22 0600) Weight:  [94.7 kg (208 lb 12.4 oz)] 94.7 kg (208 lb 12.4 oz) (03/21 1533) Weight change:  Last BM Date: 07/24/13  CBG (last 3)  No results found for this basename: GLUCAP,  in the last 72 hours  Intake/Output from previous day: 03/21 0701 - 03/22 0700 In: 1806.3 [I.V.:1806.3] Out: 1775 [Emesis/NG output:900] Intake/Output this shift:    General appearance: alert and no distress Eyes: no scleral icterus Throat: oropharynx moist without erythema Resp: clear to auscultation bilaterally Cardio: regular rate and rhythm GI: soft, non-tender; bowel sounds absent; NG in place with brown liquid drainage Extremities: no clubbing, cyanosis or edema GU: his has a right suprapubic cystic structure just anterior to his penis.  No clear hernia or tenderness.   Lab Results:  Recent Labs  07/24/13 0949 07/25/13 0412  NA 137 135*  K 3.6* 3.9  CL 95* 100  CO2 25 24  GLUCOSE 139* 148*  BUN 12 12  CREATININE 1.08 0.88  CALCIUM 10.6* 8.8    Recent Labs  07/24/13 0949  AST 22  ALT 16  ALKPHOS 100  BILITOT 0.6  PROT 9.9*  ALBUMIN 4.2    Recent Labs  07/24/13 0949 07/25/13 0412  WBC 16.7* 12.2*  NEUTROABS 14.6*  --   HGB 17.3* 13.2  HCT 49.2 38.9*  MCV 80.5 81.4  PLT 295 210   No results found for this basename: INR, PROTIME   No results found for this basename: CKTOTAL, CKMB, CKMBINDEX, TROPONINI,  in the last 72 hours No results found for this basename: TSH, T4TOTAL, FREET3, T3FREE, THYROIDAB,  in the last 72 hours No results found for this basename:  VITAMINB12, FOLATE, FERRITIN, TIBC, IRON, RETICCTPCT,  in the last 72 hours  Studies/Results: Ct Abdomen Pelvis W Contrast  07/24/2013   CLINICAL DATA:  Pain, nausea, vomiting  EXAM: CT ABDOMEN AND PELVIS WITH CONTRAST  TECHNIQUE: Multidetector CT imaging of the abdomen and pelvis was performed using the standard protocol following bolus administration of intravenous contrast.  CONTRAST:  28m OMNIPAQUE IOHEXOL 300 MG/ML SOLN, 1074mOMNIPAQUE IOHEXOL 300 MG/ML SOLN  COMPARISON:  CT ABD/PELVIS W CM dated 03/08/2011  FINDINGS: The lung bases are unremarkable  The liver, spleen, adrenals, pancreas are unremarkable. Benign Bosniak 1 cysts appreciated within the right and left kidneys. Kidneys otherwise unremarkable. Calcified gallstones appreciated within the dependent portion of the gallbladder.  Bowel wall thickening is appreciated at the level of the anastomosis of the small and large bowel image 40 through 49 series 2. There is mild inflammatory change within the adjacent fat. When compared to previous study these findings are less severe. There is no evidence of pneumoperitoneum no associated loculated fluid collections. The small bowel proximal to this finding is dilated. Stable postsurgical changes are appreciated within the right pericolic gutter region. Involving the proximal portion of the ascending colon. Small sub cm reactive mesenteric lymph nodes are identified within the abdomen.  There is no evidence of abdominal or pelvic masses.  Small amount of free fluid projects within the posterior base  of the pelvis, likely reactive.  There is no evidence of abdominal aortic aneurysm. The celiac, SMA, IMA, portal veins are opacified.  There is no evidence of aggressive appearing osseous lesions.  There is no evidence of an abdominal wall nor inguinal hernia.  IMPRESSION: 1. Inflammatory changes and the anastomotic site of the small bowel and colon with an associated small bowel obstruction. Considering the  patient's history these findings are consistent with active Crohn's disease. When compared to the previous study the severity of these findings have improved. 2. Bilateral Bosniak type 1 renal cysts 3. Gallstones   Electronically Signed   By: Margaree Mackintosh M.D.   On: 07/24/2013 11:32   Dg Abd 2 Views  07/24/2013   CLINICAL DATA:  Abdominal pain followup small bowel obstruction  EXAM: ABDOMEN - 2 VIEW  COMPARISON:  CT ABD/PELVIS W CM dated 07/24/2013  FINDINGS: NG tube within the stomach. There are no dilated loops of large or small bowel. Contrast within the bladder from recent CT scan. Multiple surgical clips noted in the right abdomen.  IMPRESSION: No evidence of bowel obstruction.   Electronically Signed   By: Suzy Bouchard M.D.   On: 07/24/2013 18:29     Medications: Scheduled: . enoxaparin (LOVENOX) injection  50 mg Subcutaneous Q24H  . methylPREDNISolone (SOLU-MEDROL) injection  80 mg Intravenous 3 times per day   Continuous: . sodium chloride 150 mL/hr at 07/24/13 1119  . sodium chloride 125 mL/hr at 07/25/13 2947    Assessment/Plan: Principal Problem: 1. Acute Crohn's disease with intestinal obstruction- clinically improved with steroids and NG suction.  Management per GI- anticipate clamping NG tube tomorrow and initiation of clear liquid diet.  Will allow him to ambulate with clamped NG tube periodically today. Active Problems: 2. Crohn's disease, question perirectal involvement- management per GI/GSU 3. Right groin fullness- unclear etiology of structural cystic abnormality in right groin.  No clear hernia and anterior location is atypical for hernia.  No abnormality seen on supine CT but more prominent when standing.  Request General Surgery to evaluate this tomorrow.  Outpatient urology was planned but will need to be rescheduled due to hospitalization.  Hold off on antibiotics. 4. Disposition- hopeful for discharge in 2-3 days if tolerating diet.   LOS: 1 day   Marton Redwood 07/25/2013, 11:04 AM

## 2013-07-25 NOTE — Progress Notes (Signed)
General Surgery Note  LOS: 1 day  POD -     Assessment/Plan: 1.  Partial SBO  WBC better.  Hgb down because of better hydration.  NGT - 900  Looks good. Would leave NGT one more day.  Check KUB tomorrow AM.  2.  Crohn's disease - followed by Dr. Cristina Gong  Resection of terminal ileum/right colon - D. Joeleen Wortley - 8/13/ 2003.  Prior SB resection in 1998.  Also has had perirectal disease that is stable. 3.  DVT prophylaxis - Lovenox 4.  Asymptomatic gall stones 5.  History of anxiety/depression 6.  Chronic foot pain that resolved on some vitamins that he is taking.   Principal Problem:   Acute Crohn's disease with intestinal obstruction Active Problems:   Crohn's disease, question perirectal involvement   Small bowel obstruction  Subjective:  Doing better.  Has had a BM.  He has 4 to 6 BMs/day, so this is a good measure of how his bowels are doing.  No complaint, other than the NGT. Objective:   Filed Vitals:   07/25/13 0600  BP: 93/57  Pulse: 70  Temp: 97.8 F (36.6 C)  Resp: 16     Intake/Output from previous day:  03/21 0701 - 03/22 0700 In: 1806.3 [I.V.:1806.3] Out: 1775 [Emesis/NG output:900]  Intake/Output this shift:      Physical Exam:   General: WN WM who is alert and oriented.    HEENT: Normal. Pupils equal. .   Lungs: Clear.   Abdomen: Soft.  Few BS.  No tenderness.   Lab Results:    Recent Labs  07/24/13 0949 07/25/13 0412  WBC 16.7* 12.2*  HGB 17.3* 13.2  HCT 49.2 38.9*  PLT 295 210    BMET   Recent Labs  07/24/13 0949 07/25/13 0412  NA 137 135*  K 3.6* 3.9  CL 95* 100  CO2 25 24  GLUCOSE 139* 148*  BUN 12 12  CREATININE 1.08 0.88  CALCIUM 10.6* 8.8    PT/INR  No results found for this basename: LABPROT, INR,  in the last 72 hours  ABG  No results found for this basename: PHART, PCO2, PO2, HCO3,  in the last 72 hours   Studies/Results:  Ct Abdomen Pelvis W Contrast  07/24/2013   CLINICAL DATA:  Pain, nausea, vomiting  EXAM: CT  ABDOMEN AND PELVIS WITH CONTRAST  TECHNIQUE: Multidetector CT imaging of the abdomen and pelvis was performed using the standard protocol following bolus administration of intravenous contrast.  CONTRAST:  37m OMNIPAQUE IOHEXOL 300 MG/ML SOLN, 1062mOMNIPAQUE IOHEXOL 300 MG/ML SOLN  COMPARISON:  CT ABD/PELVIS W CM dated 03/08/2011  FINDINGS: The lung bases are unremarkable  The liver, spleen, adrenals, pancreas are unremarkable. Benign Bosniak 1 cysts appreciated within the right and left kidneys. Kidneys otherwise unremarkable. Calcified gallstones appreciated within the dependent portion of the gallbladder.  Bowel wall thickening is appreciated at the level of the anastomosis of the small and large bowel image 40 through 49 series 2. There is mild inflammatory change within the adjacent fat. When compared to previous study these findings are less severe. There is no evidence of pneumoperitoneum no associated loculated fluid collections. The small bowel proximal to this finding is dilated. Stable postsurgical changes are appreciated within the right pericolic gutter region. Involving the proximal portion of the ascending colon. Small sub cm reactive mesenteric lymph nodes are identified within the abdomen.  There is no evidence of abdominal or pelvic masses.  Small amount of free fluid  projects within the posterior base of the pelvis, likely reactive.  There is no evidence of abdominal aortic aneurysm. The celiac, SMA, IMA, portal veins are opacified.  There is no evidence of aggressive appearing osseous lesions.  There is no evidence of an abdominal wall nor inguinal hernia.  IMPRESSION: 1. Inflammatory changes and the anastomotic site of the small bowel and colon with an associated small bowel obstruction. Considering the patient's history these findings are consistent with active Crohn's disease. When compared to the previous study the severity of these findings have improved. 2. Bilateral Bosniak type 1 renal  cysts 3. Gallstones   Electronically Signed   By: Margaree Mackintosh M.D.   On: 07/24/2013 11:32   Dg Abd 2 Views  07/24/2013   CLINICAL DATA:  Abdominal pain followup small bowel obstruction  EXAM: ABDOMEN - 2 VIEW  COMPARISON:  CT ABD/PELVIS W CM dated 07/24/2013  FINDINGS: NG tube within the stomach. There are no dilated loops of large or small bowel. Contrast within the bladder from recent CT scan. Multiple surgical clips noted in the right abdomen.  IMPRESSION: No evidence of bowel obstruction.   Electronically Signed   By: Suzy Bouchard M.D.   On: 07/24/2013 18:29     Anti-infectives:   Anti-infectives   None      Alphonsa Overall, MD, FACS Pager: [phone] Surgery Office: [phone] 07/25/2013

## 2013-07-26 ENCOUNTER — Inpatient Hospital Stay (HOSPITAL_COMMUNITY): Payer: BC Managed Care – PPO

## 2013-07-26 LAB — CBC
HCT: 39.1 % (ref 39.0–52.0)
Hemoglobin: 12.5 g/dL — ABNORMAL LOW (ref 13.0–17.0)
MCH: 26.9 pg (ref 26.0–34.0)
MCHC: 32 g/dL (ref 30.0–36.0)
MCV: 84.1 fL (ref 78.0–100.0)
Platelets: 213 10*3/uL (ref 150–400)
RBC: 4.65 MIL/uL (ref 4.22–5.81)
RDW: 14 % (ref 11.5–15.5)
WBC: 15.2 10*3/uL — AB (ref 4.0–10.5)

## 2013-07-26 LAB — BASIC METABOLIC PANEL
BUN: 11 mg/dL (ref 6–23)
CALCIUM: 8.8 mg/dL (ref 8.4–10.5)
CO2: 26 meq/L (ref 19–32)
CREATININE: 0.95 mg/dL (ref 0.50–1.35)
Chloride: 103 mEq/L (ref 96–112)
GFR calc Af Amer: >90 mL/min (ref >90)
Glucose, Bld: 141 mg/dL — ABNORMAL HIGH (ref 70–99)
Potassium: 4.1 mEq/L (ref 3.7–5.3)
SODIUM: 143 meq/L (ref 137–147)

## 2013-07-26 NOTE — Progress Notes (Signed)
Subjective: Is doing much better. Had 3 bowel movements since last evening. One of these was formed and 2 were of normal consistency for him which is more liquid yellow stool. No abdominal pain or cramping. Was able to angulate in the hall yesterday with his NG tube clamped without any problem during that time. Otherwise continues with NG tube to low wall suction.  Objective: Vital signs in last 24 hours: Temp:  [97.6 F (36.4 C)-98.3 F (36.8 C)] 98.3 F (36.8 C) (03/23 0429) Pulse Rate:  [63-74] 63 (03/23 0429) Resp:  [16-18] 18 (03/23 0429) BP: (92-94)/(53-59) 94/57 mmHg (03/23 0429) SpO2:  [95 %-98 %] 98 % (03/23 0429) Weight change:  Last BM Date: 07/25/13  CBG (last 3)  No results found for this basename: GLUCAP,  in the last 72 hours  Intake/Output from previous day: 03/22 0701 - 03/23 0700 In: 3240 [P.O.:240; I.V.:3000] Out: 2100 [Urine:1600; Emesis/NG output:500] Intake/Output this shift:    General appearance: alert and no distress Eyes: no scleral icterus Throat: oropharynx moist without erythema Resp: clear to auscultation bilaterally Cardio: regular rate and rhythm GI: soft, non-tender; hypoactive bowel sounds; no masses,  no organomegaly Extremities: no clubbing, cyanosis or edema   Lab Results:  Recent Labs  07/25/13 0412 07/26/13 0435  NA 135* 143  K 3.9 4.1  CL 100 103  CO2 24 26  GLUCOSE 148* 141*  BUN 12 11  CREATININE 0.88 0.95  CALCIUM 8.8 8.8    Recent Labs  07/24/13 0949  AST 22  ALT 16  ALKPHOS 100  BILITOT 0.6  PROT 9.9*  ALBUMIN 4.2    Recent Labs  07/24/13 0949 07/25/13 0412 07/26/13 0435  WBC 16.7* 12.2* 15.2*  NEUTROABS 14.6*  --   --   HGB 17.3* 13.2 12.5*  HCT 49.2 38.9* 39.1  MCV 80.5 81.4 84.1  PLT 295 210 213   No results found for this basename: INR, PROTIME   No results found for this basename: CKTOTAL, CKMB, CKMBINDEX, TROPONINI,  in the last 72 hours No results found for this basename: TSH, T4TOTAL,  FREET3, T3FREE, THYROIDAB,  in the last 72 hours No results found for this basename: VITAMINB12, FOLATE, FERRITIN, TIBC, IRON, RETICCTPCT,  in the last 72 hours  Studies/Results: Ct Abdomen Pelvis W Contrast  07/24/2013   CLINICAL DATA:  Pain, nausea, vomiting  EXAM: CT ABDOMEN AND PELVIS WITH CONTRAST  TECHNIQUE: Multidetector CT imaging of the abdomen and pelvis was performed using the standard protocol following bolus administration of intravenous contrast.  CONTRAST:  14m OMNIPAQUE IOHEXOL 300 MG/ML SOLN, 1022mOMNIPAQUE IOHEXOL 300 MG/ML SOLN  COMPARISON:  CT ABD/PELVIS W CM dated 03/08/2011  FINDINGS: The lung bases are unremarkable  The liver, spleen, adrenals, pancreas are unremarkable. Benign Bosniak 1 cysts appreciated within the right and left kidneys. Kidneys otherwise unremarkable. Calcified gallstones appreciated within the dependent portion of the gallbladder.  Bowel wall thickening is appreciated at the level of the anastomosis of the small and large bowel image 40 through 49 series 2. There is mild inflammatory change within the adjacent fat. When compared to previous study these findings are less severe. There is no evidence of pneumoperitoneum no associated loculated fluid collections. The small bowel proximal to this finding is dilated. Stable postsurgical changes are appreciated within the right pericolic gutter region. Involving the proximal portion of the ascending colon. Small sub cm reactive mesenteric lymph nodes are identified within the abdomen.  There is no evidence of abdominal or pelvic  masses.  Small amount of free fluid projects within the posterior base of the pelvis, likely reactive.  There is no evidence of abdominal aortic aneurysm. The celiac, SMA, IMA, portal veins are opacified.  There is no evidence of aggressive appearing osseous lesions.  There is no evidence of an abdominal wall nor inguinal hernia.  IMPRESSION: 1. Inflammatory changes and the anastomotic site of the  small bowel and colon with an associated small bowel obstruction. Considering the patient's history these findings are consistent with active Crohn's disease. When compared to the previous study the severity of these findings have improved. 2. Bilateral Bosniak type 1 renal cysts 3. Gallstones   Electronically Signed   By: Margaree Mackintosh M.D.   On: 07/24/2013 11:32   Dg Abd 2 Views  07/26/2013   CLINICAL DATA:  Follow-up of small bowel obstruction.  EXAM: ABDOMEN - 2 VIEW  COMPARISON:  DG ABD 2 VIEWS dated 07/24/2013; CT ABD/PELVIS W CM dated 07/24/2013  FINDINGS: There is additional gas within the colon today predominantly in the transverse and descending portions. No dilated small bowel loops demonstrated. There are 2 rounded calcifications which project in the right upper quadrant of the abdomen consistent with known gallstones. There is an esophagogastric tube in place whose tip and proximal port lie in the region of the gastric body. No acute bony abnormality is demonstrated.  IMPRESSION: 1. There is no evidence of bowel obstruction. The gas pattern is within the limits of normal. 2. There are 2 rounded calcified gallstones demonstrated.   Electronically Signed   By: David  Martinique   On: 07/26/2013 08:43   Dg Abd 2 Views  07/24/2013   CLINICAL DATA:  Abdominal pain followup small bowel obstruction  EXAM: ABDOMEN - 2 VIEW  COMPARISON:  CT ABD/PELVIS W CM dated 07/24/2013  FINDINGS: NG tube within the stomach. There are no dilated loops of large or small bowel. Contrast within the bladder from recent CT scan. Multiple surgical clips noted in the right abdomen.  IMPRESSION: No evidence of bowel obstruction.   Electronically Signed   By: Suzy Bouchard M.D.   On: 07/24/2013 18:29     Medications: Scheduled: . enoxaparin (LOVENOX) injection  50 mg Subcutaneous Q24H  . methylPREDNISolone (SOLU-MEDROL) injection  80 mg Intravenous 3 times per day   Continuous: . sodium chloride 150 mL/hr at 07/24/13 1119   . sodium chloride 125 mL/hr at 07/26/13 0556    Assessment/Plan: Principal Problem:  1. Acute Crohn's disease with intestinal obstruction- clinically improved with steroids and NG suction. No obstruction on KUB (not seen on admission film either).  Will clamp NG tube today and start clear liquid diet.  Discontinuation of NG tube and advancement of diet will be deferred to GI/general surgery. Will need guidance from GI regarding oral alternative for Solu-Medrol given his non-adherence to 6-MP due to concerns over increased malignancy risk.  Active Problems:  2. Crohn's disease, question perirectal involvement- management per GI/GSU  3. Right groin fullness- ? Hernia versus cystic growth. No abnormality seen on supine CT but more prominent when standing. Request General Surgery to evaluate this abnormality. Outpatient urology was scheduled but will need to be rescheduled due to hospitalization. Hold off on antibiotics.  4. Disposition- hopeful for discharge in 1-2 days if tolerating diet.   LOS: 2 days   Marton Redwood 07/26/2013, 9:00 AM

## 2013-07-26 NOTE — Progress Notes (Signed)
[facility] Progress Note  Subjective: The patient is doing well today in regards to his Crohn's disease with associated small bowel obstruction. He seems to have responded nicely to the IV steroids. His NG tube has been clamped and he has so far tolerated clear liquids today.  Objective: Vital signs in last 24 hours: Temp:  [97.6 F (36.4 C)-98.3 F (36.8 C)] 98.3 F (36.8 C) (03/23 0429) Pulse Rate:  [63-74] 63 (03/23 0429) Resp:  [16-18] 18 (03/23 0429) BP: (92-94)/(53-59) 94/57 mmHg (03/23 0429) SpO2:  [95 %-98 %] 98 % (03/23 0429) Weight change:    PE:  He is in no distress  Abdomen is soft and nontender and not distended.  Lab Results: Results for orders placed during the hospital encounter of 07/24/13 (from the past 24 hour(s))  BASIC METABOLIC PANEL     Status: Abnormal   Collection Time    07/26/13  4:35 AM      Result Value Ref Range   Sodium 143  137 - 147 mEq/L   Potassium 4.1  3.7 - 5.3 mEq/L   Chloride 103  96 - 112 mEq/L   CO2 26  19 - 32 mEq/L   Glucose, Bld 141 (*) 70 - 99 mg/dL   BUN 11  6 - 23 mg/dL   Creatinine, Ser 0.95  0.50 - 1.35 mg/dL   Calcium 8.8  8.4 - 10.5 mg/dL   GFR calc non Af Amer >90  >90 mL/min   GFR calc Af Amer >90  >90 mL/min  CBC     Status: Abnormal   Collection Time    07/26/13  4:35 AM      Result Value Ref Range   WBC 15.2 (*) 4.0 - 10.5 K/uL   RBC 4.65  4.22 - 5.81 MIL/uL   Hemoglobin 12.5 (*) 13.0 - 17.0 g/dL   HCT 39.1  39.0 - 52.0 %   MCV 84.1  78.0 - 100.0 fL   MCH 26.9  26.0 - 34.0 pg   MCHC 32.0  30.0 - 36.0 g/dL   RDW 14.0  11.5 - 15.5 %   Platelets 213  150 - 400 K/uL    Studies/Results: @RISRSLT24 @    Assessment: Crohn's disease with associated small bowel obstruction, responding nicely to therapy with IV steroids  Plan: Observe response to his liquid diet. If he does well with this we can DC the NG tube. His diet can then be advanced, he can be switched to prednisone, and then be  discharged if he does well. He will followup with Dr. Cristina Gong his gastroenterologist.    Greg [name] 07/26/2013, 10:53 AM

## 2013-07-26 NOTE — Progress Notes (Signed)
Subjective: He is feeling much better and has no complaints  Objective: Vital signs in last 24 hours: Temp:  [97.6 F (36.4 C)-98.3 F (36.8 C)] 98.3 F (36.8 C) (03/23 0429) Pulse Rate:  [63-74] 63 (03/23 0429) Resp:  [16-18] 18 (03/23 0429) BP: (92-94)/(53-59) 94/57 mmHg (03/23 0429) SpO2:  [95 %-98 %] 98 % (03/23 0429) Last BM Date: 07/25/13  Intake/Output from previous day: 03/22 0701 - 03/23 0700 In: 3240 [P.O.:240; I.V.:3000] Out: 2100 [Urine:1600; Emesis/NG output:500] Intake/Output this shift:    Resp: clear to auscultation bilaterally Cardio: regular rate and rhythm GI: soft, nontender. nondistended  Lab Results:   Recent Labs  07/25/13 0412 07/26/13 0435  WBC 12.2* 15.2*  HGB 13.2 12.5*  HCT 38.9* 39.1  PLT 210 213   BMET  Recent Labs  07/25/13 0412 07/26/13 0435  NA 135* 143  K 3.9 4.1  CL 100 103  CO2 24 26  GLUCOSE 148* 141*  BUN 12 11  CREATININE 0.88 0.95  CALCIUM 8.8 8.8   PT/INR No results found for this basename: LABPROT, INR,  in the last 72 hours ABG No results found for this basename: PHART, PCO2, PO2, HCO3,  in the last 72 hours  Studies/Results: Ct Abdomen Pelvis W Contrast  07/24/2013   CLINICAL DATA:  Pain, nausea, vomiting  EXAM: CT ABDOMEN AND PELVIS WITH CONTRAST  TECHNIQUE: Multidetector CT imaging of the abdomen and pelvis was performed using the standard protocol following bolus administration of intravenous contrast.  CONTRAST:  74m OMNIPAQUE IOHEXOL 300 MG/ML SOLN, 1026mOMNIPAQUE IOHEXOL 300 MG/ML SOLN  COMPARISON:  CT ABD/PELVIS W CM dated 03/08/2011  FINDINGS: The lung bases are unremarkable  The liver, spleen, adrenals, pancreas are unremarkable. Benign Bosniak 1 cysts appreciated within the right and left kidneys. Kidneys otherwise unremarkable. Calcified gallstones appreciated within the dependent portion of the gallbladder.  Bowel wall thickening is appreciated at the level of the anastomosis of the small and large  bowel image 40 through 49 series 2. There is mild inflammatory change within the adjacent fat. When compared to previous study these findings are less severe. There is no evidence of pneumoperitoneum no associated loculated fluid collections. The small bowel proximal to this finding is dilated. Stable postsurgical changes are appreciated within the right pericolic gutter region. Involving the proximal portion of the ascending colon. Small sub cm reactive mesenteric lymph nodes are identified within the abdomen.  There is no evidence of abdominal or pelvic masses.  Small amount of free fluid projects within the posterior base of the pelvis, likely reactive.  There is no evidence of abdominal aortic aneurysm. The celiac, SMA, IMA, portal veins are opacified.  There is no evidence of aggressive appearing osseous lesions.  There is no evidence of an abdominal wall nor inguinal hernia.  IMPRESSION: 1. Inflammatory changes and the anastomotic site of the small bowel and colon with an associated small bowel obstruction. Considering the patient'Greg [name] history these findings are consistent with active Crohn'Greg [name] disease. When compared to the previous study the severity of these findings have improved. 2. Bilateral Bosniak type 1 renal cysts 3. Gallstones   Electronically Signed   By: HeMargaree Mackintosh.D.   On: 07/24/2013 11:32   Dg Abd 2 Views  07/26/2013   CLINICAL DATA:  Follow-up of small bowel obstruction.  EXAM: ABDOMEN - 2 VIEW  COMPARISON:  DG ABD 2 VIEWS dated 07/24/2013; CT ABD/PELVIS W CM dated 07/24/2013  FINDINGS: There is additional gas within the colon today predominantly  in the transverse and descending portions. No dilated small bowel loops demonstrated. There are 2 rounded calcifications which project in the right upper quadrant of the abdomen consistent with known gallstones. There is an esophagogastric tube in place whose tip and proximal port lie in the region of the gastric body. No acute bony abnormality is  demonstrated.  IMPRESSION: 1. There is no evidence of bowel obstruction. The gas pattern is within the limits of normal. 2. There are 2 rounded calcified gallstones demonstrated.   Electronically Signed   By: David  Martinique   On: 07/26/2013 08:43   Dg Abd 2 Views  07/24/2013   CLINICAL DATA:  Abdominal pain followup small bowel obstruction  EXAM: ABDOMEN - 2 VIEW  COMPARISON:  CT ABD/PELVIS W CM dated 07/24/2013  FINDINGS: NG tube within the stomach. There are no dilated loops of large or small bowel. Contrast within the bladder from recent CT scan. Multiple surgical clips noted in the right abdomen.  IMPRESSION: No evidence of bowel obstruction.   Electronically Signed   By: Suzy Bouchard M.D.   On: 07/24/2013 18:29    Anti-infectives: Anti-infectives   None      Assessment/Plan: Greg [name] * No surgery found * abdominal xrays look normal. I agree with clamping ng and letting him have clears to see how he does. He also has a mass in the suprapubic area that is seen on CT'Greg [name] from several years back. Will discuss this with urology  LOS: 2 days    Greg Greg [name],Greg Greg [name] 07/26/2013

## 2013-07-27 DIAGNOSIS — R1909 Other intra-abdominal and pelvic swelling, mass and lump: Secondary | ICD-10-CM | POA: Diagnosis present

## 2013-07-27 LAB — CBC
HCT: 36.1 % — ABNORMAL LOW (ref 39.0–52.0)
Hemoglobin: 11.7 g/dL — ABNORMAL LOW (ref 13.0–17.0)
MCH: 27.1 pg (ref 26.0–34.0)
MCHC: 32.4 g/dL (ref 30.0–36.0)
MCV: 83.6 fL (ref 78.0–100.0)
PLATELETS: 196 10*3/uL (ref 150–400)
RBC: 4.32 MIL/uL (ref 4.22–5.81)
RDW: 14 % (ref 11.5–15.5)
WBC: 10.5 10*3/uL (ref 4.0–10.5)

## 2013-07-27 LAB — BASIC METABOLIC PANEL
BUN: 10 mg/dL (ref 6–23)
CALCIUM: 8.5 mg/dL (ref 8.4–10.5)
CO2: 27 mEq/L (ref 19–32)
CREATININE: 0.9 mg/dL (ref 0.50–1.35)
Chloride: 103 mEq/L (ref 96–112)
GFR calc non Af Amer: >90 mL/min (ref >90)
Glucose, Bld: 137 mg/dL — ABNORMAL HIGH (ref 70–99)
Potassium: 3.9 mEq/L (ref 3.7–5.3)
SODIUM: 141 meq/L (ref 137–147)

## 2013-07-27 MED ORDER — PREDNISONE 20 MG PO TABS
40.0000 mg | ORAL_TABLET | Freq: Every day | ORAL | Status: DC
  Administered 2013-07-27: 40 mg via ORAL
  Filled 2013-07-27 (×2): qty 2

## 2013-07-27 MED ORDER — PREDNISONE 20 MG PO TABS: 40.0000 mg | ORAL_TABLET | Freq: Every day | ORAL | Status: DC

## 2013-07-27 NOTE — Progress Notes (Signed)
Seen and agree

## 2013-07-27 NOTE — Discharge Summary (Signed)
DISCHARGE SUMMARY  Greg [name]  MR#: [id number]  DOB:Sep 29, 1965  Date of Admission: 07/24/2013 Date of Discharge: 07/27/2013  Attending Physician:Greg [name], Greg [name]  Patient's XFG:HWEX, Greg Saxon, MD  Consults: Greg Sabins, MD- Greg [name] [facility], MD- General Surgery  Discharge Diagnoses: Principal Problem:   Acute Crohn's disease with intestinal obstruction Active Problems:   Crohn's disease, question perirectal involvement   Small bowel obstruction   Suprapubic mass  Past Medical History  Diagnosis Date  . IBS (irritable bowel syndrome)   . Crohn's disease   . Anxiety   . Perirectal abscess   . Depression   . History of fatty infiltration of liver   . Perianal fistula   . Other vitamin B12 deficiency anemia 03/16/2013  . Vitamin D deficiency     Past Surgical History  Procedure Laterality Date  . Resections      done twice, ileal resections    Discharge Medications:   Medication List         acetaminophen 500 MG tablet  Commonly known as:  TYLENOL  Take 1,000 mg by mouth every 6 (six) hours as needed. pain     multivitamin with minerals Tabs tablet  Take 1 tablet by mouth daily.     predniSONE 20 MG tablet  Commonly known as:  DELTASONE  Take 2 tablets (40 mg total) by mouth daily before breakfast.     traMADol 50 MG tablet  Commonly known as:  ULTRAM  Take 50 mg by mouth every 6 (six) hours as needed for moderate pain or severe pain.     Vitamin D 2000 UNITS Caps  Take 2,000 Units by mouth daily.        Hospital Procedures: Ct Abdomen Pelvis W Contrast  07/26/2013   ADDENDUM REPORT: 07/26/2013 10:32  ADDENDUM: Revaluation of the above CT was performed with added history of palpable abnormality and clinical concern at the base the of penis. Evaluation was also correlated with a discussion with Dr.Olson and comparison to prior study 11/08/2006.  An area of inflammatory change is appreciated within the subcutaneous fat of the lower pelvis just  anterior to the base of the penis containing a central area of fluid attenuation. Dr. Jeannine Kitten discussed these findings with Dr. Marlou Starks.  Further evaluation of this finding with contrasted MRI is recommended, for further characterization. Differential considerations include: chronic inflammatory change with possible areas of scarring and reactive fluid, phlegmonous change and/or abscess, or possibly even a fistula considering the patient's history of Crohn's disease.   Electronically Signed   By: Margaree Mackintosh M.D.   On: 07/26/2013 10:32   07/26/2013   CLINICAL DATA:  Pain, nausea, vomiting  EXAM: CT ABDOMEN AND PELVIS WITH CONTRAST  TECHNIQUE: Multidetector CT imaging of the abdomen and pelvis was performed using the standard protocol following bolus administration of intravenous contrast.  CONTRAST:  59m OMNIPAQUE IOHEXOL 300 MG/ML SOLN, 1074mOMNIPAQUE IOHEXOL 300 MG/ML SOLN  COMPARISON:  CT ABD/PELVIS W CM dated 03/08/2011  FINDINGS: The lung bases are unremarkable  The liver, spleen, adrenals, pancreas are unremarkable. Benign Bosniak 1 cysts appreciated within the right and left kidneys. Kidneys otherwise unremarkable. Calcified gallstones appreciated within the dependent portion of the gallbladder.  Bowel wall thickening is appreciated at the level of the anastomosis of the small and large bowel image 40 through 49 series 2. There is mild inflammatory change within the adjacent fat. When compared to previous study these findings are less severe. There is no evidence of pneumoperitoneum no  associated loculated fluid collections. The small bowel proximal to this finding is dilated. Stable postsurgical changes are appreciated within the right pericolic gutter region. Involving the proximal portion of the ascending colon. Small sub cm reactive mesenteric lymph nodes are identified within the abdomen.  There is no evidence of abdominal or pelvic masses.  Small amount of free fluid projects within the posterior base  of the pelvis, likely reactive.  There is no evidence of abdominal aortic aneurysm. The celiac, SMA, IMA, portal veins are opacified.  There is no evidence of aggressive appearing osseous lesions.  There is no evidence of an abdominal wall nor inguinal hernia.  IMPRESSION: 1. Inflammatory changes and the anastomotic site of the small bowel and colon with an associated small bowel obstruction. Considering the patient's history these findings are consistent with active Crohn's disease. When compared to the previous study the severity of these findings have improved. 2. Bilateral Bosniak type 1 renal cysts 3. Gallstones  Electronically Signed: By: Margaree Mackintosh M.D. On: 07/24/2013 11:32   Dg Abd 2 Views  07/26/2013   CLINICAL DATA:  Follow-up of small bowel obstruction.  EXAM: ABDOMEN - 2 VIEW  COMPARISON:  DG ABD 2 VIEWS dated 07/24/2013; CT ABD/PELVIS W CM dated 07/24/2013  FINDINGS: There is additional gas within the colon today predominantly in the transverse and descending portions. No dilated small bowel loops demonstrated. There are 2 rounded calcifications which project in the right upper quadrant of the abdomen consistent with known gallstones. There is an esophagogastric tube in place whose tip and proximal port lie in the region of the gastric body. No acute bony abnormality is demonstrated.  IMPRESSION: 1. There is no evidence of bowel obstruction. The gas pattern is within the limits of normal. 2. There are 2 rounded calcified gallstones demonstrated.   Electronically Signed   By: David  Martinique   On: 07/26/2013 08:43   Dg Abd 2 Views  07/24/2013   CLINICAL DATA:  Abdominal pain followup small bowel obstruction  EXAM: ABDOMEN - 2 VIEW  COMPARISON:  CT ABD/PELVIS W CM dated 07/24/2013  FINDINGS: NG tube within the stomach. There are no dilated loops of large or small bowel. Contrast within the bladder from recent CT scan. Multiple surgical clips noted in the right abdomen.  IMPRESSION: No evidence of  bowel obstruction.   Electronically Signed   By: Suzy Bouchard M.D.   On: 07/24/2013 18:29    History of Present Illness: Greg [name] is a 48 year old white male with a history of Crohn's disease status post ileal resection x2, history of perirectal abscess, and chronic foot pain who presented to the emergency department with sudden onset abdominal pain. Patient has a long-standing history of Crohn's disease which has been relatively stable. He had two terminal ileal resection's in the past, most recently in 2002. At baseline, he has 6-7 bowel movements per day. He has been prescribed 6 MP; however, he has not been taking that recently do to fear of causing cancer. He denies any blood in the stools. He sees Dr. Cristina Gong about every 3 months. 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 This has been stable. He has not been seen in my office for several years until this past week when he presented with a right groin swelling which was reminiscent of "knot" that he had several years ago which resolved with antibiotics prescribed by urology. This right groin swelling is larger when  he stands. He saw our nurse practitioner who recommended a CT scan but he deferred, preferring to see urology which is scheduled soon. This morning, he awoke suddenly with severe abdominal pain and cramping. He has not had any pain like this recently. He subsequently had multiple episodes of nausea and vomiting. This prompted him to come the emergency department where CT shows bowel obstruction at the small bowel and colon anastomotic site with surrounding inflammation consistent with Crohn's flare. GI and general surgery were consulted and and preferred medical admission. An NG tube was placed. He is being admitted for further management.   Hospital Course: Greg [name] was admitted to a medical bed. He was placed on bowel rest with n.p.o. status. NG tube was placed to  intermittent low wall suction. He was started on IV Solu-Medrol for his Crohn's flare. General surgery and GI were consulted and followed his care with general recommendations regarding his diet and NG management.  He has done well with resolution of his obstruction.  His bowel function returned within 24 hours of admission and he had had 4-5 bowel movements within the past 24 hours which are typical in consistency for his bowels (liquid yellow stools). His abdominal pain has resolved. No nausea or vomiting. He has tolerated a clear liquid diet-will be advanced to a regular diet this morning. If he tolerates this he will be discharged home on oral prednisone 40 mg daily after light. He will need close followup with Dr. Cristina Gong for recommendation regarding steroid taper and maintenance treatment for his Crohn's.  He also has a right suprapubic mass which she noted several weeks ago. He had a similar finding several years ago that resolved with antibiotics. He was scheduled for see urology during this hospitalization but that has been postponed. General surgery does not feel that it is a hernia and recommends urology evaluation.  Day of Discharge Exam BP 98/59  Pulse 68  Temp(Src) 97.7 F (36.5 C) (Oral)  Resp 18  Ht 6' 1"  (1.854 m)  Wt 94.7 kg (208 lb 12.4 oz)  BMI 27.55 kg/m2  SpO2 95%  Physical Exam: General appearance: alert and no distress Eyes: no scleral icterus Throat: oropharynx moist without erythema Resp: clear to auscultation bilaterally Cardio: regular rate and rhythm GI: soft, non-tender; bowel sounds normal; no masses,  no organomegaly Extremities: no clubbing, cyanosis or edema  Discharge Labs:  Recent Labs  07/26/13 0435 07/27/13 0328  NA 143 141  K 4.1 3.9  CL 103 103  CO2 26 27  GLUCOSE 141* 137*  BUN 11 10  CREATININE 0.95 0.90  CALCIUM 8.8 8.5    Recent Labs  07/24/13 0949  AST 22  ALT 16  ALKPHOS 100  BILITOT 0.6  PROT 9.9*  ALBUMIN 4.2    Recent  Labs  07/24/13 0949  07/26/13 0435 07/27/13 0328  WBC 16.7*  < > 15.2* 10.5  NEUTROABS 14.6*  --   --   --   HGB 17.3*  < > 12.5* 11.7*  HCT 49.2  < > 39.1 36.1*  MCV 80.5  < > 84.1 83.6  PLT 295  < > 213 196  < > = values in this interval not displayed. No results found for this basename: INR, PROTIME   Discharge instructions:     Discharge Orders   Future Orders Complete By Expires   Diet general  As directed    Comments:     Low residue, bland diet- advance as tolerated   Discharge  instructions  As directed    Comments:     Call if increased abdominal pain, inability to have BM, blood in stools.  Follow-up with Dr. Cristina Gong regarding steroid taper and maintenance treatment plan.  Follow-up with urology regarding suprapubic growth   Increase activity slowly  As directed       Disposition: to home  Follow-up Appts: Follow-up with Dr. Brigitte Pulse at [facility] in 1 week.  Condition on Discharge: improved, stable  Tests Needing Follow-up:  None  Signed: Marton Redwood 07/27/2013, 6:43 AM

## 2013-07-27 NOTE — Discharge Instructions (Addendum)
Crohn's Disease Crohn's disease is a long-term (chronic) soreness and redness (inflammation) of the intestines (bowel). It can affect any portion of the digestive tract, from the mouth to the anus. It can also cause problems outside the digestive tract. Crohn's disease is closely related to a disease called ulcerative colitis (together, these two diseases are called inflammatory bowel disease).  CAUSES  The cause of Crohn's disease is not known. One Link Snuffer is that, in an easily affected person, the immune system is triggered to attack the body's own digestive tissue. Crohn's disease runs in families. It seems to be more common in certain geographic areas and amongst certain races. There are no clear-cut dietary causes.  SYMPTOMS  Crohn's disease can cause many different symptoms since it can affect many different parts of the body. Symptoms include:  Fatigue.  Weight loss.  Chronic diarrhea, sometime bloody.  Abdominal pain and cramps.  Fever.  Ulcers or canker sores in the mouth or rectum.  Anemia (low red blood cells).  Arthritis, skin problems, and eye problems may occur. Complications of Crohn's disease can include:  Series of holes (perforation) of the bowel.  Portions of the intestines sticking to each other (adhesions).  Obstruction of the bowel.  Fistula formation, typically in the rectal area but also in other areas. A fistula is an opening between the bowels and the outside, or between the bowels and another organ.  A painful crack in the mucous membrane of the anus (rectal fissure). DIAGNOSIS  Your caregiver may suspect Crohn's disease based on your symptoms and an exam. Blood tests may confirm that there is a problem. You may be asked to submit a stool specimen for examination. X-rays and CT scans may be necessary. Ultimately, the diagnosis is usually made after a procedure that uses a flexible tube that is inserted via your mouth or your anus. This is done under  sedation and is called either an upper endoscopy or colonoscopy. With these tests, the specialist can take tiny tissue samples and remove them from the inside of the bowel (biopsy). Examination of this biopsy tissue under a microscope can reveal Crohn's disease as the cause of your symptoms. Due to the many different forms that Crohn's disease can take, symptoms may be present for several years before a diagnosis is made. TREATMENT  Medications are often used to decrease inflammation and control the immune system. These include medicines related to aspirin, steroid medications, and newer and stronger medications to slow down the immune system. Some medications may be used as suppositories or enemas. A number of other medications are used or have been studied. Your caregiver will make specific recommendations. HOME CARE INSTRUCTIONS   Symptoms such as diarrhea can be controlled with medications. Avoid foods that have a laxative effect such as fresh fruit, vegetables and dairy products. During flare ups, you can rest your bowel by refraining from solid foods. Drink clear liquids frequently during the day (electrolyte or re-hydrating fluids are best. Your caregiver can help you with suggestions). Drink often to prevent loss of body fluids (dehydration). When diarrhea has cleared, eat small meals and more frequently. Avoid food additives and stimulants such as caffeine (coffee, tea, or chocolate). Enzyme supplements may help if you develop intolerance to a sugar in dairy products (lactose). Ask your caregiver or dietitian about specific dietary instructions.  Try to maintain a positive attitude. Learn relaxation techniques such as self hypnosis, mental imaging, and muscle relaxation.  If possible, avoid stresses which can aggravate your condition.  Exercise regularly.  Follow your diet.  Always get plenty of rest. SEEK MEDICAL CARE IF:   Your symptoms fail to improve after a week or two of new  treatment.  You experience continued weight loss.  You have ongoing cramps or loose bowels.  You develop a new skin rash, skin sores, or eye problems. SEEK IMMEDIATE MEDICAL CARE IF:   You have worsening of your symptoms or develop new symptoms.  You have a fever.  You develop bloody diarrhea.  You develop severe abdominal pain. MAKE SURE YOU:   Understand these instructions.  Will watch your condition.  Will get help right away if you are not doing well or get worse. Document Released: 01/30/2005 Document Revised: 08/17/2012 Document Reviewed: 12/29/2006 [facility] Patient Information 2014 [place], [place].  Intestinal Obstruction An intestinal obstruction is a blockage of the intestine. It can be caused by a physical blockage or by a problem of abnormal function of the intestine.  CAUSES   Adhesions from previous surgeries.  Cancer or tumor.  A hernia, which is a condition in which a portion of the bowel bulges out through an opening or weakness in the abdomen. This sometimes squeezes the bowel.  A swallowed object.  Blockage (impaction) with worms is common in third world countries.  A twisting of the bowel or telescoping of a portion of the bowel into another portion (intussusceptions).  Anything that stops food from going through from the stomach to the anus. SYMPTOMS  Symptoms of bowel obstruction may include abdominal bloating, nausea, vomiting, explosive diarrhea, or explosive stool. You may not be able to hear your normal bowel sounds (such as "growling in your stomach"). You may also stop having bowel movements or passing gas. DIAGNOSIS  Usually this condition is diagnosed with a history and an examination. Often, lab studies (blood work) and X-rays may be used to find the cause. TREATMENT  The main treatment for this condition is to rest the intestine. Often, the obstruction may relieve itself and allow the intestine to start working again. Think of the  intestine like a balloon that is blown up (filled with trapped food and water that has squeezed into a hole or area that it cannot get through).   If the obstruction is complete, a nasogastric (NG) tube is passed through the nose and into the stomach. It is then connected to suction to keep the stomach emptied out. This also helps treat the nausea and vomiting.  If there is an imbalance in the electrolytes, they are corrected with intravenous fluids. These fluids have the proper chemicals in them to correct the problem.  If the reason for the blockage does not get better with conservative (nonsurgical) treatment, surgery may be necessary. Sometimes, surgery is done immediately if your surgeon knows that the problem is not going to get better with conservative treatment. PROGNOSIS  Depending on what the problem is, most of these problems can be treated by your caregivers with good results. Your caregiver will discuss with you the best course of action to take. FOLLOWING SURGERY Seek immediate medical attention if you have:  Increasing abdominal pain, repeated vomiting, dehydration, or fainting.  Severe weakness, chest pain, or back pain.  Blood in your vomit or stool.  Tarry stool. Document Released: 07/13/2003 Document Revised: 08/17/2012 Document Reviewed: 12/11/2007 [facility] Patient Information 2014 [place].

## 2013-07-27 NOTE — Progress Notes (Signed)
  Subjective: He is on a regular diet, and feels very good.  For discharge later today.  Objective: Vital signs in last 24 hours: Temp:  [97.6 F (36.4 C)-98.8 F (37.1 C)] 97.7 F (36.5 C) (03/24 0539) Pulse Rate:  [61-74] 68 (03/24 0539) Resp:  [16-18] 18 (03/24 0539) BP: (97-100)/(52-59) 98/59 mmHg (03/24 0539) SpO2:  [95 %-99 %] 95 % (03/24 0539) Last BM Date: Jul 29, 2013 1380 PO recorded Regular diet ordered this Am Afebrile, BP in the 90's Labs OK Intake/Output from previous day: 07/30/22 0701 - 03/24 0700 In: 1380 [P.O.:1380] Out: 1500 [Urine:1500] Intake/Output this shift:    General appearance: alert, cooperative and no distress GI: soft, non-tender; bowel sounds normal; no masses,  no organomegaly  Lab Results:   Recent Labs  2013/07/29 0435 07/27/13 0328  WBC 15.2* 10.5  HGB 12.5* 11.7*  HCT 39.1 36.1*  PLT 213 196    BMET  Recent Labs  Jul 29, 2013 0435 07/27/13 0328  NA 143 141  K 4.1 3.9  CL 103 103  CO2 26 27  GLUCOSE 141* 137*  BUN 11 10  CREATININE 0.95 0.90  CALCIUM 8.8 8.5   PT/INR No results found for this basename: LABPROT, INR,  in the last 72 hours   Recent Labs Lab 07/24/13 0949  AST 22  ALT 16  ALKPHOS 100  BILITOT 0.6  PROT 9.9*  ALBUMIN 4.2     Lipase     Component Value Date/Time   LIPASE 121* 07/24/2013 0949     Studies/Results: Dg Abd 2 Views  07-29-13   CLINICAL DATA:  Follow-up of small bowel obstruction.  EXAM: ABDOMEN - 2 VIEW  COMPARISON:  DG ABD 2 VIEWS dated 07/24/2013; CT ABD/PELVIS W CM dated 07/24/2013  FINDINGS: There is additional gas within the colon today predominantly in the transverse and descending portions. No dilated small bowel loops demonstrated. There are 2 rounded calcifications which project in the right upper quadrant of the abdomen consistent with known gallstones. There is an esophagogastric tube in place whose tip and proximal port lie in the region of the gastric body. No acute bony  abnormality is demonstrated.  IMPRESSION: 1. There is no evidence of bowel obstruction. The gas pattern is within the limits of normal. 2. There are 2 rounded calcified gallstones demonstrated.   Electronically Signed   By: David  Martinique   On: 07/29/13 08:43    Medications: . enoxaparin (LOVENOX) injection  50 mg Subcutaneous Q24H  . predniSONE  40 mg Oral QAC breakfast    Assessment/Plan PSBO improving on steroids;  Crohn's disease, off treatement s/p resection of right colon and terminal ileum 1998, and 2003.  Cholelithiasis  Hx of Anxiety and depression  Chronic foot pain  Right groin fullness subcutaneous fat   Plan:  No further SBO. For D/c later today.  He is going home on prednisone and then will convert to 6-mercaptopurine, he has been off this for several months   LOS: 3 days    Greg [name] 07/27/2013

## 2013-09-18 ENCOUNTER — Emergency Department (HOSPITAL_COMMUNITY): Payer: BC Managed Care – PPO

## 2013-09-18 ENCOUNTER — Encounter (HOSPITAL_COMMUNITY): Payer: Self-pay | Admitting: Emergency Medicine

## 2013-09-18 ENCOUNTER — Inpatient Hospital Stay (HOSPITAL_COMMUNITY)
Admission: EM | Admit: 2013-09-18 | Discharge: 2013-09-21 | DRG: 389 | Disposition: A | Discharge status: Home / Self Care | Payer: BC Managed Care – PPO | Attending: Internal Medicine | Admitting: Internal Medicine

## 2013-09-18 DIAGNOSIS — F329 Major depressive disorder, single episode, unspecified: Secondary | ICD-10-CM | POA: Diagnosis present

## 2013-09-18 DIAGNOSIS — K589 Irritable bowel syndrome without diarrhea: Secondary | ICD-10-CM | POA: Diagnosis present

## 2013-09-18 DIAGNOSIS — IMO0002 Reserved for concepts with insufficient information to code with codable children: Secondary | ICD-10-CM

## 2013-09-18 DIAGNOSIS — F411 Generalized anxiety disorder: Secondary | ICD-10-CM | POA: Diagnosis present

## 2013-09-18 DIAGNOSIS — K56609 Unspecified intestinal obstruction, unspecified as to partial versus complete obstruction: Secondary | ICD-10-CM

## 2013-09-18 DIAGNOSIS — K5000 Crohn's disease of small intestine without complications: Secondary | ICD-10-CM | POA: Diagnosis present

## 2013-09-18 DIAGNOSIS — E559 Vitamin D deficiency, unspecified: Secondary | ICD-10-CM | POA: Diagnosis present

## 2013-09-18 DIAGNOSIS — K5090 Crohn's disease, unspecified, without complications: Secondary | ICD-10-CM

## 2013-09-18 DIAGNOSIS — K5669 Other intestinal obstruction: Principal | ICD-10-CM | POA: Diagnosis present

## 2013-09-18 DIAGNOSIS — Z801 Family history of malignant neoplasm of trachea, bronchus and lung: Secondary | ICD-10-CM

## 2013-09-18 DIAGNOSIS — K603 Anal fistula, unspecified: Secondary | ICD-10-CM | POA: Diagnosis present

## 2013-09-18 DIAGNOSIS — D518 Other vitamin B12 deficiency anemias: Secondary | ICD-10-CM | POA: Diagnosis present

## 2013-09-18 DIAGNOSIS — F3289 Other specified depressive episodes: Secondary | ICD-10-CM | POA: Diagnosis present

## 2013-09-18 DIAGNOSIS — Z836 Family history of other diseases of the respiratory system: Secondary | ICD-10-CM

## 2013-09-18 LAB — COMPREHENSIVE METABOLIC PANEL
ALBUMIN: 4 g/dL (ref 3.5–5.2)
ALK PHOS: 80 U/L (ref 39–117)
ALT: 21 U/L (ref 0–53)
AST: 45 U/L — AB (ref 0–37)
BILIRUBIN TOTAL: 0.7 mg/dL (ref 0.3–1.2)
BUN: 11 mg/dL (ref 6–23)
CHLORIDE: 97 meq/L (ref 96–112)
CO2: 21 meq/L (ref 19–32)
Calcium: 9.6 mg/dL (ref 8.4–10.5)
Creatinine, Ser: 0.93 mg/dL (ref 0.50–1.35)
GFR calc Af Amer: >90 mL/min (ref >90)
GFR calc non Af Amer: >90 mL/min (ref >90)
Glucose, Bld: 130 mg/dL — ABNORMAL HIGH (ref 70–99)
Potassium: 4.1 mEq/L (ref 3.7–5.3)
Sodium: 138 mEq/L (ref 137–147)
Total Protein: 8.9 g/dL — ABNORMAL HIGH (ref 6.0–8.3)

## 2013-09-18 LAB — CBC WITH DIFFERENTIAL/PLATELET
BASOS PCT: 0 % (ref 0–1)
Basophils Absolute: 0 10*3/uL (ref 0.0–0.1)
Eosinophils Absolute: 0 10*3/uL (ref 0.0–0.7)
Eosinophils Relative: 0 % (ref 0–5)
HCT: 45.6 % (ref 39.0–52.0)
HEMOGLOBIN: 15.8 g/dL (ref 13.0–17.0)
Lymphocytes Relative: 5 % — ABNORMAL LOW (ref 12–46)
Lymphs Abs: 0.8 10*3/uL (ref 0.7–4.0)
MCH: 27.6 pg (ref 26.0–34.0)
MCHC: 34.6 g/dL (ref 30.0–36.0)
MCV: 79.7 fL (ref 78.0–100.0)
MONOS PCT: 7 % (ref 3–12)
Monocytes Absolute: 1.1 10*3/uL — ABNORMAL HIGH (ref 0.1–1.0)
NEUTROS ABS: 14.1 10*3/uL — AB (ref 1.7–7.7)
Neutrophils Relative %: 88 % — ABNORMAL HIGH (ref 43–77)
Platelets: 264 10*3/uL (ref 150–400)
RBC: 5.72 MIL/uL (ref 4.22–5.81)
RDW: 14 % (ref 11.5–15.5)
WBC: 16.1 10*3/uL — ABNORMAL HIGH (ref 4.0–10.5)

## 2013-09-18 LAB — URINALYSIS, ROUTINE W REFLEX MICROSCOPIC
BILIRUBIN URINE: NEGATIVE
GLUCOSE, UA: NEGATIVE mg/dL
Hgb urine dipstick: NEGATIVE
KETONES UR: NEGATIVE mg/dL
LEUKOCYTES UA: NEGATIVE
Nitrite: NEGATIVE
Protein, ur: NEGATIVE mg/dL
Specific Gravity, Urine: 1.025 (ref 1.005–1.030)
Urobilinogen, UA: 0.2 mg/dL (ref 0.0–1.0)
pH: 5.5 (ref 5.0–8.0)

## 2013-09-18 LAB — LIPASE, BLOOD: Lipase: 114 U/L — ABNORMAL HIGH (ref 11–59)

## 2013-09-18 LAB — SEDIMENTATION RATE: SED RATE: 23 mm/h — AB (ref 0–16)

## 2013-09-18 LAB — C-REACTIVE PROTEIN: CRP: 0.7 mg/dL — ABNORMAL HIGH (ref <0.60)

## 2013-09-18 MED ORDER — PROMETHAZINE HCL 25 MG/ML IJ SOLN: 25.0000 mg | Freq: Three times a day (TID) | INTRAMUSCULAR | Status: DC | PRN

## 2013-09-18 MED ORDER — HEPARIN SODIUM (PORCINE) 5000 UNIT/ML IJ SOLN
5000.0000 [IU] | Freq: Three times a day (TID) | INTRAMUSCULAR | Status: DC
  Administered 2013-09-18 – 2013-09-21 (×9): 5000 [IU] via SUBCUTANEOUS
  Filled 2013-09-18 (×12): qty 1

## 2013-09-18 MED ORDER — SODIUM CHLORIDE 0.9 % IV SOLN
INTRAVENOUS | Status: DC
  Administered 2013-09-18 – 2013-09-19 (×2): via INTRAVENOUS

## 2013-09-18 MED ORDER — ACETAMINOPHEN 325 MG PO TABS: 650.0000 mg | ORAL_TABLET | Freq: Four times a day (QID) | ORAL | Status: DC | PRN

## 2013-09-18 MED ORDER — ONDANSETRON HCL 4 MG/2ML IJ SOLN
4.0000 mg | Freq: Four times a day (QID) | INTRAMUSCULAR | Status: DC | PRN
  Filled 2013-09-18: qty 2

## 2013-09-18 MED ORDER — METHYLPREDNISOLONE SODIUM SUCC 125 MG IJ SOLR
125.0000 mg | Freq: Once | INTRAMUSCULAR | Status: AC
  Administered 2013-09-18: 125 mg via INTRAVENOUS
  Filled 2013-09-18: qty 2

## 2013-09-18 MED ORDER — ONDANSETRON HCL 4 MG PO TABS: 4.0000 mg | ORAL_TABLET | Freq: Four times a day (QID) | ORAL | Status: DC | PRN

## 2013-09-18 MED ORDER — HYDROMORPHONE HCL PF 1 MG/ML IJ SOLN
1.0000 mg | INTRAMUSCULAR | Status: DC | PRN
  Administered 2013-09-18 – 2013-09-20 (×4): 1 mg via INTRAVENOUS
  Filled 2013-09-18 (×4): qty 1

## 2013-09-18 MED ORDER — LORAZEPAM 2 MG/ML IJ SOLN
1.0000 mg | Freq: Once | INTRAMUSCULAR | Status: AC
  Administered 2013-09-18: 1 mg via INTRAVENOUS
  Filled 2013-09-18: qty 1

## 2013-09-18 MED ORDER — SODIUM CHLORIDE 0.9 % IV BOLUS (SEPSIS)
500.0000 mL | Freq: Once | INTRAVENOUS | Status: AC
  Administered 2013-09-18: 500 mL via INTRAVENOUS

## 2013-09-18 MED ORDER — HYDROMORPHONE HCL PF 1 MG/ML IJ SOLN
1.0000 mg | Freq: Once | INTRAMUSCULAR | Status: AC
  Administered 2013-09-18: 1 mg via INTRAVENOUS
  Filled 2013-09-18: qty 1

## 2013-09-18 MED ORDER — SODIUM CHLORIDE 0.9 % IV BOLUS (SEPSIS)
1000.0000 mL | Freq: Once | INTRAVENOUS | Status: AC
  Administered 2013-09-18: 1000 mL via INTRAVENOUS

## 2013-09-18 MED ORDER — PROMETHAZINE HCL 25 MG/ML IJ SOLN
25.0000 mg | Freq: Once | INTRAMUSCULAR | Status: AC
  Administered 2013-09-18: 25 mg via INTRAVENOUS
  Filled 2013-09-18 (×2): qty 1

## 2013-09-18 MED ORDER — LORAZEPAM 2 MG/ML IJ SOLN
0.5000 mg | Freq: Four times a day (QID) | INTRAMUSCULAR | Status: DC | PRN
  Administered 2013-09-18 – 2013-09-20 (×3): 0.5 mg via INTRAVENOUS
  Filled 2013-09-18 (×4): qty 1

## 2013-09-18 MED ORDER — ACETAMINOPHEN 650 MG RE SUPP: 650.0000 mg | Freq: Four times a day (QID) | RECTAL | Status: DC | PRN

## 2013-09-18 MED ORDER — METHYLPREDNISOLONE SODIUM SUCC 125 MG IJ SOLR
60.0000 mg | Freq: Two times a day (BID) | INTRAMUSCULAR | Status: DC
  Administered 2013-09-18 – 2013-09-20 (×5): 60 mg via INTRAVENOUS
  Filled 2013-09-18 (×7): qty 0.96

## 2013-09-18 NOTE — H&P (Signed)
Greg [name] is an 48 y.o. male.   PCP:   Marton Redwood, MD   Chief Complaint:  SBO, Ab Pain.  HPI: 1 Male c Crohn's Dz off meds per him and Dr Cristina Gong.  He has had prior SBOs Surgery 2002. Has been in normal state of health. No issues until 2 am - developed sig pains c N/V Had BM this am. He presented to med attention and had NGT placed - he has been treated c Dilauduid, phenergan, and Ativan.  He feels better but drowsy.  He reports no recent Sxs of active crohns. He had similar episode in march Known perirectal fistula isn't greatly increased from previous. No Fevers/Chills. Surgery was called first and wanted med to admit      Past Medical History:  Past Medical History  Diagnosis Date  . IBS (irritable bowel syndrome)   . Crohn's disease   . Anxiety   . Perirectal abscess   . Depression   . History of fatty infiltration of liver   . Perianal fistula   . Other vitamin B12 deficiency anemia 03/16/2013  . Vitamin D deficiency     Past Surgical History  Procedure Laterality Date  . Resections      done twice, ileal resections      Allergies:  No Known Allergies   Medications: Prior to Admission medications   Medication Sig Start Date End Date Taking? Authorizing Provider  traMADol (ULTRAM) 50 MG tablet Take 50 mg by mouth every 6 (six) hours as needed for moderate pain or severe pain.   Yes Historical Provider, MD      (Not in a hospital admission)   Social History:  reports that he has never smoked. He has never used smokeless tobacco. He reports that he does not drink alcohol or use illicit drugs.  Family History: Family History  Problem Relation Age of Onset  . Cancer Mother     lung  . Other Mother     pulmonary fibrosis    Review of Systems:  Review of Systems - No CP/SOB No real issues prior to 2 am. See HPI. All other ROS (-) x sleepy  Physical Exam:  Blood pressure 114/73, pulse 85, temperature 97.7 F (36.5 C),  temperature source Oral, resp. rate 16, SpO2 94.00%. Filed Vitals:   09/18/13 0815 09/18/13 0900 09/18/13 1000 09/18/13 1030  BP: 120/82 128/84 105/76 114/73  Pulse: 90 94 73 85  Temp:      TempSrc:      Resp: 16 14 15 16   SpO2: 94% 94% 94% 94%   General appearance: Drowsy but Alert and O.  No distress Head: Normocephalic, without obvious abnormality, atraumatic Eyes: conjunctivae/corneas clear. PERRL, EOM's intact.  Nose: NGT in place - mild blood in nare Throat: lips, mucosa, and tongue normal; teeth and gums normal Neck: no adenopathy, no carotid bruit, no JVD and thyroid not enlarged, symmetric, no tenderness/mass/nodules Resp: CTA B Cardio: Reg GI: softer than expected.  Mild bloat. Extremities: extremities normal, atraumatic, no cyanosis or edema Pulses: 2+ and symmetric Lymph nodes: Cervical adenopathy: no cervical lymphadenopathy Neurologic: Alert and oriented X 3, normal strength and tone. Normal symmetric reflexes.     Labs on Admission:   Recent Labs  09/18/13 0735  NA 138  K 4.1  CL 97  CO2 21  GLUCOSE 130*  BUN 11  CREATININE 0.93  CALCIUM 9.6    Recent Labs  09/18/13 0735  AST 45*  ALT 21  ALKPHOS 80  BILITOT 0.7  PROT 8.9*  ALBUMIN 4.0    Recent Labs  09/18/13 0735  LIPASE 114*    Recent Labs  09/18/13 0805  WBC 16.1*  NEUTROABS 14.1*  HGB 15.8  HCT 45.6  MCV 79.7  PLT 264   No results found for this basename: CKTOTAL, CKMB, CKMBINDEX, TROPONINI,  in the last 72 hours No results found for this basename: INR,  PROTIME     LAB RESULT POCT:  Results for orders placed during the hospital encounter of 09/18/13  COMPREHENSIVE METABOLIC PANEL      Result Value Ref Range   Sodium 138  137 - 147 mEq/L   Potassium 4.1  3.7 - 5.3 mEq/L   Chloride 97  96 - 112 mEq/L   CO2 21  19 - 32 mEq/L   Glucose, Bld 130 (*) 70 - 99 mg/dL   BUN 11  6 - 23 mg/dL   Creatinine, Ser 0.93  0.50 - 1.35 mg/dL   Calcium 9.6  8.4 - 10.5 mg/dL   Total  Protein 8.9 (*) 6.0 - 8.3 g/dL   Albumin 4.0  3.5 - 5.2 g/dL   AST 45 (*) 0 - 37 U/L   ALT 21  0 - 53 U/L   Alkaline Phosphatase 80  39 - 117 U/L   Total Bilirubin 0.7  0.3 - 1.2 mg/dL   GFR calc non Af Amer >90  >90 mL/min   GFR calc Af Amer >90  >90 mL/min  LIPASE, BLOOD      Result Value Ref Range   Lipase 114 (*) 11 - 59 U/L  CBC WITH DIFFERENTIAL      Result Value Ref Range   WBC 16.1 (*) 4.0 - 10.5 K/uL   RBC 5.72  4.22 - 5.81 MIL/uL   Hemoglobin 15.8  13.0 - 17.0 g/dL   HCT 45.6  39.0 - 52.0 %   MCV 79.7  78.0 - 100.0 fL   MCH 27.6  26.0 - 34.0 pg   MCHC 34.6  30.0 - 36.0 g/dL   RDW 14.0  11.5 - 15.5 %   Platelets 264  150 - 400 K/uL   Neutrophils Relative % 88 (*) 43 - 77 %   Neutro Abs 14.1 (*) 1.7 - 7.7 K/uL   Lymphocytes Relative 5 (*) 12 - 46 %   Lymphs Abs 0.8  0.7 - 4.0 K/uL   Monocytes Relative 7  3 - 12 %   Monocytes Absolute 1.1 (*) 0.1 - 1.0 K/uL   Eosinophils Relative 0  0 - 5 %   Eosinophils Absolute 0.0  0.0 - 0.7 K/uL   Basophils Relative 0  0 - 1 %   Basophils Absolute 0.0  0.0 - 0.1 K/uL      Radiological Exams on Admission: Dg Abd 1 View  09/18/2013   CLINICAL DATA:  Left-sided abdominal pain.  History of Crohn's.  EXAM: ABDOMEN - 1 VIEW  COMPARISON:  07/26/2013  FINDINGS: Dilated fluid and gas filled small bowel in the left abdomen. The colon is relatively decompressed. Postsurgical changes in the right abdomen related to bowel resection at the ileocecal valve. Two gallstones again seen in the right upper quadrant. Ovoid densities in the right abdomen are presumably within the fecal stream given no urolithiasis seen on comparison imaging. Lung bases clear. No acute osseous findings.  IMPRESSION: 1. Small bowel obstruction. 2. Cholelithiasis.   Electronically Signed   By: Jorje Guild M.D.   On: 09/18/2013 07:59  No orders found for this or any previous visit.   Assessment/Plan Active Problems:   Crohn's disease, question perirectal  involvement   Small bowel obstruction   SBO in pt c Crohn's disease status post ileal resection x2 = 2002  - NGT. Pain control. Anxiety and nausea control. Bowel rest. time. May need TNA if cannot feed after a few days. Surgery consulted. GI consulted. Sxs more c/w Adhesions > Active crohn's CT from March reviewed. Will leave up to Surgery whether new CT needed.  GI to be made aware of admission. Check Sed rate and CRP Hold Solu-Medrol unless suspected crohn's flare He is off 6MP.  Has appt c GI at [facility] in next @ 2 weeks. Leukocytosis - follow - Certainly could be leukomoid Reaction? DVT proph ordered.   Precious Reel 09/18/2013, 10:48 AM

## 2013-09-18 NOTE — ED Notes (Signed)
Pt. Was given a urinal  at the bedside. Will collect urine when the pt. Voids.

## 2013-09-18 NOTE — ED Notes (Signed)
md made aware pt vomitted estimated 158m brown stool smelling vomit. Will place NG tube. Pt made aware. Requesting ativan before NG tube placed. At present pt attempting to urinate.

## 2013-09-18 NOTE — ED Notes (Signed)
Pt still unable to urinate

## 2013-09-18 NOTE — Consult Note (Signed)
Re:   Greg [name] DOB:   Apr 03, 1966 MRN:   [id number]  ASSESSMENT AND PLAN: 1.  Recurrent SBO  He appeared to have Crohn's flare up on his last CT scan.  He has been asymptomatic since his last obstruction until this AM.  Agree with NGT, IVF, and follow up labs/x-rays.  Discussed with Dr. Virgina Jock.  Dr. Michail Sermon (covering for [place]) has been contacted.  Will follow.  2. Crohn's disease - followed by Dr. Cristina Gong   Resection of terminal ileum/right colon - D. Shalva Rozycki - 8/13/ 2003. Prior SB resection in 1998.   Also has had perirectal disease that is stable.  3. Asymptomatic gall stones (known at least since 2008) 4. History of anxiety/depression  5. Chronic foot pain that resolved on some vitamins that he is taking.   Chief Complaint  Patient presents with  . Abdominal Pain   REFERRING PHYSICIAN: Marton Redwood, MD  HISTORY OF PRESENT ILLNESS: Greg [name] is a 48 y.o. (DOB: Oct 22, 1965)  white  male whose primary care physician is Marton Redwood, MD and comes to the [facility] today for bowel obstruction.  He was in the hospital in March 21-24, 2015 for a small bowel obstruction.  This resolved with medical therapy.  He saw Dr. Cristina Gong as recently as just a few weeks ago.  Dr. Cristina Gong has weaned Pearline Cables off all his Crohn's meds.  He was doing very well until he was awoken at 2 AM this morning with abdominal pain.  About 3:30 AM he started vomiting.  He came to the [facility] ER this AM.  He normally has about 5 BM's per day.  He actually had a BM about 30 minutes before he come to the hospital.  He has done well since his last hosptialization.  I know him from the past in that I have operated on him twice for Crohn's - in 1998 and 2003.  He has also developed symptomatic rectal disease from the Crohn's.  KUB - 09/18/2013 - 1. Small bowel obstruction. 2. Cholelithiasis. CT scan of abdomen - 07/26/2013 - Inflammatory changes and the anastomotic site of the small bowel and colon with an associated  small bowel obstruction.  WBC - 16,100 - 09/18/2013 Lipase - 114 - 09/18/2013    Past Medical History  Diagnosis Date  . IBS (irritable bowel syndrome)   . Crohn's disease   . Anxiety   . Perirectal abscess   . Depression   . History of fatty infiltration of liver   . Perianal fistula   . Other vitamin B12 deficiency anemia 03/16/2013  . Vitamin D deficiency       Past Surgical History  Procedure Laterality Date  . Resections      done twice, ileal resections      No current facility-administered medications for this encounter.   Current Outpatient Prescriptions  Medication Sig Dispense Refill  . traMADol (ULTRAM) 50 MG tablet Take 50 mg by mouth every 6 (six) hours as needed for moderate pain or severe pain.         No Known Allergies  REVIEW OF SYSTEMS: Skin:  No history of rash.  No history of abnormal moles. Infection:  No history of hepatitis or HIV.  No history of MRSA. Neurologic:  No history of stroke.  No history of seizure.  No history of headaches. Cardiac:  No history of hypertension. No history of heart disease.  No history of seeing a cardiologist. Pulmonary:  Does not smoke cigarettes.  No  asthma or bronchitis.  No OSA/CPAP.  Endocrine:  No diabetes. No thyroid disease.  Has been on and off steroids. Gastrointestinal:  See HPI. Has known gall stones. Urologic:  No history of kidney stones.  No history of bladder infections. Musculoskeletal:  No history of joint or back disease. Hematologic:  No bleeding disorder.  No history of anemia.  Not anticoagulated. Psycho-social:  History of anxiety and depression.  SOCIAL and FAMILY HISTORY: Unmarried. Lives by self. Works doing Product manager.  PHYSICAL EXAM: BP 114/73  Pulse 85  Temp(Src) 97.7 F (36.5 C) (Oral)  Resp 16  SpO2 94%  General: WN WM who is alert.  Has NGT in place.  HEENT: Normal. Pupils equal. Neck: Supple. No mass.  No thyroid mass. Lymph Nodes:  No supraclavicular or cervical  nodes. Lungs: Clear to auscultation and symmetric breath sounds. Heart:  RRR. No murmur or rub. Abdomen: Soft. No mass.Old midline scar.  No localized abdominal tenderness.  Though he said when he was hurting earlier, it was to the left of midline. Rectal: Not done. Extremities:  Good strength and ROM  in upper and lower extremities. Neurologic:  Grossly intact to motor and sensory function. Psychiatric: Has normal mood and affect. Behavior is normal.   DATA REVIEWED: Epic notes and x-rays.  Alphonsa Overall, MD,  [facility], [address]  [place], [place]    [place] Phone:  [phone] FAX:  [phone]

## 2013-09-18 NOTE — ED Notes (Signed)
Helped pt. Stand to urinate,but he could not go.

## 2013-09-18 NOTE — ED Notes (Signed)
Pt provided with a urinal, he will try to urinate shortly.

## 2013-09-18 NOTE — ED Notes (Signed)
Pt drowsy after receiving ativan, responds to verbal stimuli and follows commands when prompted. O2 sat 94% on room air. Will monitor pt. md made aware pt is drowsy and resting with eyes closed.

## 2013-09-18 NOTE — ED Notes (Signed)
Pt arrived to the ED with a complaint of abdominal pain.  Pt has a hx of Crohn disease and bowel obstruction.  Pt woke up at 0200 this am with emesis and nausea as well as abdominal pain.  Pt states abdominal pain is located medial and left side.

## 2013-09-18 NOTE — ED Notes (Signed)
md at bedside  Pt alert and oriented x4. Respirations even and unlabored, bilateral symmetrical rise and fall of chest. Skin warm and dry. In no acute distress. Denies needs.

## 2013-09-18 NOTE — ED Notes (Addendum)
Internal medicine at bedside, made aware pt has not given urine sample  Pt alert and oriented x4. Respirations even and unlabored, bilateral symmetrical rise and fall of chest. Skin warm and dry. In no acute distress. Denies needs.

## 2013-09-18 NOTE — ED Provider Notes (Signed)
CSN: [id number]     Arrival date & time 09/18/13  9169 History   First MD Initiated Contact with Patient 09/18/13 0700     Chief Complaint  Patient presents with  . Abdominal Pain     (Consider location/radiation/quality/duration/timing/severity/associated sxs/prior Treatment) HPI This is a 48 year old male with a history of Crohn's disease and small bowel obstruction who comes in today complaining of pain that began at 2 AM. He describes this pain as similar to other episodes of bowel obstruction. He has had multiple episodes of nausea and vomiting with this. He states that the pain comes in waves. The pain decreases after vomiting. He has not had anything to eat or drink by mouth since the pain began. He is followed by Dr. Cristina Gong for his Crohn's disease and is currently not on any medications. He states that since his last colon resection in 2002 it is felt that his Crohn's has not been particularly active. He does continue to have a perirectal fistula but states that this isn't greatly increased from previous. He has not noted any fever or chills. He was last admitted for a bowel obstruction in March and it resolved without surgery.  Past Medical History  Diagnosis Date  . IBS (irritable bowel syndrome)   . Crohn's disease   . Anxiety   . Perirectal abscess   . Depression   . History of fatty infiltration of liver   . Perianal fistula   . Other vitamin B12 deficiency anemia 03/16/2013  . Vitamin D deficiency    Past Surgical History  Procedure Laterality Date  . Resections      done twice, ileal resections   Family History  Problem Relation Age of Onset  . Cancer Mother     lung  . Other Mother     pulmonary fibrosis   History  Substance Use Topics  . Smoking status: Never Smoker   . Smokeless tobacco: Never Used  . Alcohol Use: No    Review of Systems  All other systems reviewed and are negative.     Allergies  Review of patient's allergies indicates no known  allergies.  Home Medications   Prior to Admission medications   Medication Sig Start Date End Date Taking? Authorizing Provider  traMADol (ULTRAM) 50 MG tablet Take 50 mg by mouth every 6 (six) hours as needed for moderate pain or severe pain.   Yes Historical Provider, MD   BP 122/87  Pulse 91  Temp(Src) 97.7 F (36.5 C) (Oral)  Resp 16  SpO2 97% Physical Exam  Nursing note and vitals reviewed. Constitutional: He is oriented to person, place, and time. He appears well-developed and well-nourished.  HENT:  Head: Normocephalic and atraumatic.  Right Ear: External ear normal.  Left Ear: External ear normal.  Nose: Nose normal.  Mouth/Throat: Oropharynx is clear and moist.  Eyes: Conjunctivae and EOM are normal. Pupils are equal, round, and reactive to light.  Neck: Normal range of motion. Neck supple.  Cardiovascular: Tachycardia present.   Pulmonary/Chest: Effort normal and breath sounds normal.  Abdominal: Soft. There is generalized tenderness.  No bowel sounds are noted on my exam. He has mild diffuse tenderness to palpation with no point tenderness noted.  Musculoskeletal: Normal range of motion.  Neurological: He is alert and oriented to person, place, and time. He has normal reflexes.  Skin: Skin is warm and dry.  Psychiatric: He has a normal mood and affect. His behavior is normal. Thought content normal.  ED Course  Procedures (including critical care time) Labs Review Labs Reviewed  COMPREHENSIVE METABOLIC PANEL - Abnormal; Notable for the following:    Glucose, Bld 130 (*)    Total Protein 8.9 (*)    AST 45 (*)    All other components within normal limits  LIPASE, BLOOD - Abnormal; Notable for the following:    Lipase 114 (*)    All other components within normal limits  CBC WITH DIFFERENTIAL - Abnormal; Notable for the following:    WBC 16.1 (*)    Neutrophils Relative % 88 (*)    Neutro Abs 14.1 (*)    Lymphocytes Relative 5 (*)    Monocytes Absolute  1.1 (*)    All other components within normal limits  CBC WITH DIFFERENTIAL  URINALYSIS, ROUTINE W REFLEX MICROSCOPIC    Imaging Review Dg Abd 1 View  09/18/2013   CLINICAL DATA:  Left-sided abdominal pain.  History of Crohn's.  EXAM: ABDOMEN - 1 VIEW  COMPARISON:  07/26/2013  FINDINGS: Dilated fluid and gas filled small bowel in the left abdomen. The colon is relatively decompressed. Postsurgical changes in the right abdomen related to bowel resection at the ileocecal valve. Two gallstones again seen in the right upper quadrant. Ovoid densities in the right abdomen are presumably within the fecal stream given no urolithiasis seen on comparison imaging. Lung bases clear. No acute osseous findings.  IMPRESSION: 1. Small bowel obstruction. 2. Cholelithiasis.   Electronically Signed   By: Jorje Guild M.D.   On: 09/18/2013 07:59     EKG Interpretation None      MDM   Final diagnoses:  None   Patient with history of present disease status post: Resections who presents today with signs and symptoms consistent with small bowel obstruction. He is given pain medicines and had an NG tube placed with good resolution of his symptoms. I consulted surgery, Dr. Excell Seltzer, and subsequently per his request consultation with general medicine. Patient's primary care doctor is Dr. Brigitte Pulse and I discussed the patient's care with Dr. Virgina Jock. Dr. Virgina Jock will be in to see and evaluate the patient. General surgery will consult. Patient currently is resting and comfortable. I am currently not opted to get a CT scan as he has not had signs and symptoms of his Crohn's disease and appears to have a small bowel obstruction. I did discuss this with Dr. Virgina Jock.   Shaune Pollack, MD 09/18/13 [phone]

## 2013-09-18 NOTE — Consult Note (Signed)
Referring Provider: Dr. Virgina Jock Primary Care Physician:  Marton Redwood, MD Primary Gastroenterologist:  Dr. Cristina Gong  Reason for Consultation:  Small bowel Obstruction; Crohn's Disease  HPI: Greg [name] is a 48 y.o. male who has a 20+ year history of Crohn's disease and s/p partial ileal resection in 1998 and ileocectomy in 2003 who has had perianal disease over the years. His last colonoscopy in 05/2012 showed a neoterminal ileal stenosis just proximal to the anastomosis. Chronic active inflammation and abscess formation was noted of the neoterminal ileum and ileocolonic anastomosis. He has had compliance issues with the 6-MP over the years and currently states he is off of it for fear of the potential cancer risk. In March 2015 he had a SBO requiring hospitalization that quickly responded to NG suction, bowel rest, and steroids. He last saw Dr. Cristina Gong on 08/26/13 and was on 10 mg Prednisone at that time and felt well at that time (was off 6-MP at that time as well). His perianal drainage improved on the steroids but since tapering off of them the drainage has increased again. Referred back to [facility] GI by Dr. Cristina Gong and Mr. Mells states he is scheduled to see Dr. Carlos Levering this coming Thursday.  Denies any abdominal pain since April until early this morning when he developed acute left-sided sharp abdominal pain with recurrent nausea and vomiting. Reports feeling fine yesterday. BMs are at their baseline of loose nonbloody stools per day. Denies any increase (beyond his chronic amount of drainage) in perianal drainage recently. Denies perianal or rectal pain. Denies melena or hematochezia.  Xray showed air-fluid levels in small bowel consistent with small bowel obstruction. NGT placed with bilious drainage noted.  Past Medical History  Diagnosis Date  . IBS (irritable bowel syndrome)   . Crohn's disease   . Anxiety   . Perirectal abscess   . Depression   . History of fatty infiltration of liver    . Perianal fistula   . Other vitamin B12 deficiency anemia 03/16/2013  . Vitamin D deficiency     Past Surgical History  Procedure Laterality Date  . Resections      done twice, ileal resections    Prior to Admission medications   Medication Sig Start Date End Date Taking? Authorizing Provider  traMADol (ULTRAM) 50 MG tablet Take 50 mg by mouth every 6 (six) hours as needed for moderate pain or severe pain.   Yes Historical Provider, MD    Scheduled Meds: . heparin  5,000 Units Subcutaneous 3 times per day   Continuous Infusions: . sodium chloride 50 mL/hr at 09/18/13 1429   PRN Meds:.acetaminophen, acetaminophen, HYDROmorphone (DILAUDID) injection, LORazepam, ondansetron (ZOFRAN) IV, ondansetron, promethazine  Allergies as of 09/18/2013  . (No Known Allergies)    Family History  Problem Relation Age of Onset  . Cancer Mother     lung  . Other Mother     pulmonary fibrosis    History   Social History  . Marital Status: Single    Spouse Name: N/A    Number of Children: 0  . Years of Education: college   Occupational History  . self employed    Social History Main Topics  . Smoking status: Never Smoker   . Smokeless tobacco: Never Used  . Alcohol Use: No  . Drug Use: No  . Sexual Activity: No   Other Topics Concern  . Not on file   Social History Narrative  . No narrative on file    Review  of Systems: All negative except as stated above in HPI.  Physical Exam: Vital signs: Filed Vitals:   09/18/13 1400  BP: 103/68  Pulse: 73  Temp: 97 F (36.1 C)  Resp: 18     General:   Lethargic but arousable, Alert,  Well-developed, well-nourished, pleasant and cooperative in NAD HEENT: anicteric, no oral lesions Neck: supple, nontender Lungs:  Clear throughout to auscultation.   No wheezes, crackles, or rhonchi. No acute distress. Heart:  Regular rate and rhythm; no murmurs, clicks, rubs,  or gallops. Abdomen: nontender, soft, nondistended, no bowel  sounds appreciated  Rectal:  Deferred Ext: no edema  GI:  Lab Results:  Recent Labs  09/18/13 0805  WBC 16.1*  HGB 15.8  HCT 45.6  PLT 264   BMET  Recent Labs  09/18/13 0735  NA 138  K 4.1  CL 97  CO2 21  GLUCOSE 130*  BUN 11  CREATININE 0.93  CALCIUM 9.6   LFT  Recent Labs  09/18/13 0735  PROT 8.9*  ALBUMIN 4.0  AST 45*  ALT 21  ALKPHOS 80  BILITOT 0.7   PT/INR No results found for this basename: LABPROT, INR,  in the last 72 hours   Studies/Results: Dg Abd 1 View  09/18/2013   CLINICAL DATA:  Left-sided abdominal pain.  History of Crohn's.  EXAM: ABDOMEN - 1 VIEW  COMPARISON:  07/26/2013  FINDINGS: Dilated fluid and gas filled small bowel in the left abdomen. The colon is relatively decompressed. Postsurgical changes in the right abdomen related to bowel resection at the ileocecal valve. Two gallstones again seen in the right upper quadrant. Ovoid densities in the right abdomen are presumably within the fecal stream given no urolithiasis seen on comparison imaging. Lung bases clear. No acute osseous findings.  IMPRESSION: 1. Small bowel obstruction. 2. Cholelithiasis.   Electronically Signed   By: Jorje Guild M.D.   On: 09/18/2013 07:59    Impression/Plan: 48 yo with longstanding Crohn's ileitis presenting with a small bowel obstruction and similar presentation to March. I think despite the acute onset of his symptoms this obstruction is likely to be inflammatory more so than adhesional based on the stenosis seen in his neoterminal ileum on his last colonoscopy. Would do IV steroids. Continue NG suction, bowel rest, IVFs. A biologic in the setting of an inflammatory stricture/stenosis would be very concerning to initiate and definitely NO indication to start one during this hospitalization. If SBO does not clear with steroids, then will need surgery. Hopefully, his symptoms will abate with IV Solumedrol and he can be transitioned to Prednisone in a few days.  He likely needs to be on a maintenance therapy for his Crohn's (as an outpt) but he has been noncompiant with 6-MP as stated in HPI. No role for 6-MP right now since therapeutic benefit takes 2-3 months to be effective and he is unlikely to take it consistently long enough to show benefit. Will follow.    LOS: 0 days   Lear Ng  09/18/2013, 3:00 PM

## 2013-09-19 ENCOUNTER — Inpatient Hospital Stay (HOSPITAL_COMMUNITY): Payer: BC Managed Care – PPO

## 2013-09-19 LAB — CBC
HCT: 38.5 % — ABNORMAL LOW (ref 39.0–52.0)
Hemoglobin: 13.4 g/dL (ref 13.0–17.0)
MCH: 28.5 pg (ref 26.0–34.0)
MCHC: 34.8 g/dL (ref 30.0–36.0)
MCV: 81.9 fL (ref 78.0–100.0)
PLATELETS: 215 10*3/uL (ref 150–400)
RBC: 4.7 MIL/uL (ref 4.22–5.81)
RDW: 14 % (ref 11.5–15.5)
WBC: 9.5 10*3/uL (ref 4.0–10.5)

## 2013-09-19 LAB — COMPREHENSIVE METABOLIC PANEL
ALBUMIN: 3 g/dL — AB (ref 3.5–5.2)
ALK PHOS: 61 U/L (ref 39–117)
ALT: 14 U/L (ref 0–53)
AST: 14 U/L (ref 0–37)
BILIRUBIN TOTAL: 0.5 mg/dL (ref 0.3–1.2)
BUN: 12 mg/dL (ref 6–23)
CHLORIDE: 104 meq/L (ref 96–112)
CO2: 24 mEq/L (ref 19–32)
CREATININE: 0.86 mg/dL (ref 0.50–1.35)
Calcium: 8.8 mg/dL (ref 8.4–10.5)
GLUCOSE: 134 mg/dL — AB (ref 70–99)
Potassium: 3.5 mEq/L — ABNORMAL LOW (ref 3.7–5.3)
Sodium: 141 mEq/L (ref 137–147)
Total Protein: 6.8 g/dL (ref 6.0–8.3)

## 2013-09-19 MED ORDER — POTASSIUM CHLORIDE IN NACL 20-0.9 MEQ/L-% IV SOLN
INTRAVENOUS | Status: DC
  Administered 2013-09-19 – 2013-09-20 (×2): via INTRAVENOUS
  Filled 2013-09-19 (×2): qty 1000

## 2013-09-19 MED ORDER — MENTHOL 3 MG MT LOZG
1.0000 | LOZENGE | OROMUCOSAL | Status: DC | PRN
  Filled 2013-09-19: qty 9

## 2013-09-19 NOTE — Progress Notes (Signed)
Subjective: Admitted c another SBO. +/- Crohn's Solumedrol given x 1 yest and started IV q 12. Appreciate Gen Surg and GI input. Lots better this am and Gen surgery contemplating removing NGT later  Objective: Vital signs in last 24 hours: Temp:  [97 F (36.1 C)-98.5 F (36.9 C)] 97.4 F (36.3 C) (05/17 0530) Pulse Rate:  [68-94] 74 (05/17 0530) Resp:  [14-18] 18 (05/17 0530) BP: (102-128)/(64-84) 106/66 mmHg (05/17 0530) SpO2:  [94 %-97 %] 94 % (05/17 0530) Weight:  [86.183 kg (190 lb)-93.985 kg (207 lb 3.2 oz)] 93.985 kg (207 lb 3.2 oz) (05/17 0530) Weight change:  Last BM Date: 09/19/13  CBG (last 3)  No results found for this basename: GLUCAP,  in the last 72 hours  Intake/Output from previous day:  Intake/Output Summary (Last 24 hours) at 09/19/13 0837 Last data filed at 09/19/13 0556  Gross per 24 hour  Intake 2332.5 ml  Output   1750 ml  Net  582.5 ml   05/16 0701 - 05/17 0700 In: 2332.5 [I.V.:2272.5; NG/GT:60] Out: 4196 [Urine:1050; Emesis/NG output:800]   Physical Exam  General appearance: A and O Eyes: no scleral icterus Throat: oropharynx moist without erythema NGT in place Resp: CTA Cardio: Reg GI: soft,less distended.  Non Tender.  Much better Extremities: no clubbing, cyanosis or edema   Lab Results:  Recent Labs  09/18/13 0735 09/19/13 0450  NA 138 141  K 4.1 3.5*  CL 97 104  CO2 21 24  GLUCOSE 130* 134*  BUN 11 12  CREATININE 0.93 0.86  CALCIUM 9.6 8.8     Recent Labs  09/18/13 0735 09/19/13 0450  AST 45* 14  ALT 21 14  ALKPHOS 80 61  BILITOT 0.7 0.5  PROT 8.9* 6.8  ALBUMIN 4.0 3.0*     Recent Labs  09/18/13 0805 09/19/13 0450  WBC 16.1* 9.5  NEUTROABS 14.1*  --   HGB 15.8 13.4  HCT 45.6 38.5*  MCV 79.7 81.9  PLT 264 215    No results found for this basename: INR, PROTIME    No results found for this basename: CKTOTAL, CKMB, CKMBINDEX, TROPONINI,  in the last 72 hours  No results found for this basename:  TSH, T4TOTAL, FREET3, T3FREE, THYROIDAB,  in the last 72 hours  No results found for this basename: VITAMINB12, FOLATE, FERRITIN, TIBC, IRON, RETICCTPCT,  in the last 72 hours  Micro Results: No results found for this or any previous visit (from the past 240 hour(s)).   Studies/Results: Dg Abd 1 View  09/18/2013   CLINICAL DATA:  Left-sided abdominal pain.  History of Crohn's.  EXAM: ABDOMEN - 1 VIEW  COMPARISON:  07/26/2013  FINDINGS: Dilated fluid and gas filled small bowel in the left abdomen. The colon is relatively decompressed. Postsurgical changes in the right abdomen related to bowel resection at the ileocecal valve. Two gallstones again seen in the right upper quadrant. Ovoid densities in the right abdomen are presumably within the fecal stream given no urolithiasis seen on comparison imaging. Lung bases clear. No acute osseous findings.  IMPRESSION: 1. Small bowel obstruction. 2. Cholelithiasis.   Electronically Signed   By: Jorje Guild M.D.   On: 09/18/2013 07:59     Medications: Scheduled: . heparin  5,000 Units Subcutaneous 3 times per day  . methylPREDNISolone (SOLU-MEDROL) injection  60 mg Intravenous Q12H   Continuous: . sodium chloride 50 mL/hr at 09/19/13 2229     Assessment/Plan: Principal Problem:   Small bowel obstruction Active Problems:  Crohn's disease, question perirectal involvement   SBO (small bowel obstruction)   SBO in pt c Crohn's disease status post ileal resection x2 = 2002 - NGT. Pain control. Anxiety and nausea control. Bowel rest. time. May need TNA if cannot feed after a few days.  Surgery input appreciated and they are following along.  GI consulted and Dr Michail Sermon stated yesterday that: despite the acute onset of his symptoms this obstruction is likely to be inflammatory more so than adhesional based on the stenosis seen in his neoterminal ileum on his last colonoscopy. Would do IV steroids. Continue NG suction, bowel rest, IVFs. A  biologic in the setting of an inflammatory stricture/stenosis would be very concerning to initiate and definitely NO indication to start one during this hospitalization. If SBO does not clear with steroids, then will need surgery. Hopefully, his symptoms will abate with IV Solumedrol and he can be transitioned to Prednisone in a few days. He likely needs to be on a maintenance therapy for his Crohn's (as an outpt) Sed rate=23 and CRP = 0.7  Continue Solumedrol at 60 IV q 12. He is off 6MP. Has appt c GI at [facility] on Thursday  Leukocytosis - already improved despite the steroids For repeat KUB and if looks as good as he does clinically we can D/c NGT and give clears. DVT proph ordered.    LOS: 1 day   Precious Reel 09/19/2013, 8:37 AM

## 2013-09-19 NOTE — Progress Notes (Addendum)
Patient ID: Greg [name], male   DOB: 1966-04-30, 48 y.o.   MRN: [id number] [facility] Gastroenterology Progress Note  Greg [name] 48 y.o. 18-Apr-1966   Subjective: Feels a lot better today. Denies abdominal pain. Moving bowels.  Objective: Vital signs in last 24 hours: Filed Vitals:   09/19/13 1030  BP: 102/68  Pulse: 68  Temp: 97.5 F (36.4 C)  Resp: 18  NG output: 700 cc  Physical Exam: Gen: alert, no acute distress, NGT  HEENT: anicteric CV: RRR Chest: CTA anteriorly Abd: soft, nontender, nondistended, decreased bowel sounds  Lab Results:  Recent Labs  09/18/13 0735 09/19/13 0450  NA 138 141  K 4.1 3.5*  CL 97 104  CO2 21 24  GLUCOSE 130* 134*  BUN 11 12  CREATININE 0.93 0.86  CALCIUM 9.6 8.8    Recent Labs  09/18/13 0735 09/19/13 0450  AST 45* 14  ALT 21 14  ALKPHOS 80 61  BILITOT 0.7 0.5  PROT 8.9* 6.8  ALBUMIN 4.0 3.0*    Recent Labs  09/18/13 0805 09/19/13 0450  WBC 16.1* 9.5  NEUTROABS 14.1*  --   HGB 15.8 13.4  HCT 45.6 38.5*  MCV 79.7 81.9  PLT 264 215   No results found for this basename: LABPROT, INR,  in the last 72 hours   Assessment/Plan: 48 yo with SBO that I think is a Crohn's inflammatory stricture but he also likely has adhesions playing a role. Symptoms have dramatically improved with bowel rest, NGT, IV steroids. Xray shows persisting SBO with some gas progression distally. Defer NG removal timing to surgery. If NG removed, then ok to start ice chips at that time.   Discussed 6-MP again with him and he says he thinks he would be willing to take it now since this is his 2nd obstruction this year. If steroids can continue to resolve his symptoms and keep them resolved as an outpt and 6-MP can be resumed in the near future with the goal of keeping him in remission and free of further obstructions, then he may be able to avoid further surgery. Hopefully, his obstruction will resolve in time for him to make it to his outpt [facility]  appt this Thursday, where they can discuss the role of future use of biologics vs 6-MP trial again. He states that he does not want to start a biologic if indicated because of it likely being a lifelong treatment.   Lear Ng 09/19/2013, 11:18 AM

## 2013-09-19 NOTE — Progress Notes (Signed)
Patient ID: Greg [name], male   DOB: [birth year], 48 y.o.   MRN: [id number]    Subjective: Feels much better this morning. Has had gas and bowel movements. Denies pain or cramping or bloating.  Objective: Vital signs in last 24 hours: Temp:  [97 F (36.1 C)-98.5 F (36.9 C)] 97.4 F (36.3 C) (05/17 0530) Pulse Rate:  [68-94] 74 (05/17 0530) Resp:  [14-18] 18 (05/17 0530) BP: (102-128)/(64-84) 106/66 mmHg (05/17 0530) SpO2:  [94 %-97 %] 94 % (05/17 0530) Weight:  [190 lb (86.183 kg)-207 lb 3.2 oz (93.985 kg)] 207 lb 3.2 oz (93.985 kg) (05/17 0530) Last BM Date: 09/19/13  Intake/Output from previous day: 05/16 0701 - 05/17 0700 In: 2332.5 [I.V.:2272.5; NG/GT:60] Out: 1850 [Urine:1050; Emesis/NG output:800] Intake/Output this shift:    General appearance: alert, cooperative and no distress GI: nondistended. Positive bowel sounds. Soft and nontender.  Lab Results:   Recent Labs  09/18/13 0805 09/19/13 0450  WBC 16.1* 9.5  HGB 15.8 13.4  HCT 45.6 38.5*  PLT 264 215   BMET  Recent Labs  09/18/13 0735 09/19/13 0450  NA 138 141  K 4.1 3.5*  CL 97 104  CO2 21 24  GLUCOSE 130* 134*  BUN 11 12  CREATININE 0.93 0.86  CALCIUM 9.6 8.8     Studies/Results: Dg Abd 1 View  09/18/2013   CLINICAL DATA:  Left-sided abdominal pain.  History of Crohn's.  EXAM: ABDOMEN - 1 VIEW  COMPARISON:  07/26/2013  FINDINGS: Dilated fluid and gas filled small bowel in the left abdomen. The colon is relatively decompressed. Postsurgical changes in the right abdomen related to bowel resection at the ileocecal valve. Two gallstones again seen in the right upper quadrant. Ovoid densities in the right abdomen are presumably within the fecal stream given no urolithiasis seen on comparison imaging. Lung bases clear. No acute osseous findings.  IMPRESSION: 1. Small bowel obstruction. 2. Cholelithiasis.   Electronically Signed   By: Jorje Guild M.D.   On: 09/18/2013 07:59     Anti-infectives: Anti-infectives   None      Assessment/Plan: Crohn's disease with recurrent small bowel obstruction. This appears to have improved quickly with bowel rest and possibly steroids. Repeat abdominal x-rays today. If these are improved I think we could get his NG tube out.    LOS: 1 day    Edward Jolly 09/19/2013

## 2013-09-20 ENCOUNTER — Inpatient Hospital Stay (HOSPITAL_COMMUNITY): Payer: BC Managed Care – PPO

## 2013-09-20 NOTE — Progress Notes (Signed)
Subjective: Doing well ready to get the tube out.  Objective: Vital signs in last 24 hours: Temp:  [97.5 F (36.4 C)-98 F (36.7 C)] 97.8 F (36.6 C) (05/18 0544) Pulse Rate:  [58-68] 58 (05/18 0544) Resp:  [16-18] 16 (05/18 0544) BP: (102-109)/(67-68) 109/67 mmHg (05/18 0544) SpO2:  [94 %-97 %] 94 % (05/18 0544) Last BM Date: 09/19/13 200 From NG,  Stool recorded 5/16, none recorded yesterday. Afebrile, VSS No labs today Film today shows:  Scattered large and small bowel gas is noted. A few loops of dilated small bowel are noted in the left quadrant. Multiple gallstones are again identified.  No significant change Clears ordered this AM.  Intake/Output from previous day: 05/17 0701 - 05/18 0700 In: 1456.8 [I.V.:1396.8; NG/GT:60] Out: 2200 [Urine:2000; Emesis/NG output:200] Intake/Output this shift:    General appearance: alert, cooperative and no distress GI: soft sore, + BS and multiple BM's.  Lab Results:   Recent Labs  09/18/13 0805 09/19/13 0450  WBC 16.1* 9.5  HGB 15.8 13.4  HCT 45.6 38.5*  PLT 264 215    BMET  Recent Labs  09/18/13 0735 09/19/13 0450  NA 138 141  K 4.1 3.5*  CL 97 104  CO2 21 24  GLUCOSE 130* 134*  BUN 11 12  CREATININE 0.93 0.86  CALCIUM 9.6 8.8   PT/INR No results found for this basename: LABPROT, INR,  in the last 72 hours   Recent Labs Lab 09/18/13 0735 09/19/13 0450  AST 45* 14  ALT 21 14  ALKPHOS 80 61  BILITOT 0.7 0.5  PROT 8.9* 6.8  ALBUMIN 4.0 3.0*     Lipase     Component Value Date/Time   LIPASE 114* 09/18/2013 0735     Studies/Results: Dg Abd 1 View  09/20/2013   CLINICAL DATA:  Recent small bowel obstruction  EXAM: ABDOMEN - 1 VIEW  COMPARISON:  09/19/2013  FINDINGS: Scattered large and small bowel gas is noted. A few loops of dilated small bowel are noted in the left quadrant. Multiple gallstones are again identified.  IMPRESSION: No significant change from the prior exam.   Electronically  Signed   By: Inez Catalina M.D.   On: 09/20/2013 08:25   Dg Abd 2 Views  09/19/2013   CLINICAL DATA:  Follow-up small bowel obstruction. History of Crohn's disease.  EXAM: ABDOMEN - 2 VIEW  COMPARISON:  DG ABDOMEN 1V dated 09/18/2013; CT ABD/PELVIS W CM dated 07/24/2013; DG ABD 2 VIEWS dated 07/26/2013  FINDINGS: NG tube tip and side-port project over the stomach. Re- demonstrated fluid fluid levels and dilated loops of small bowel within the abdomen measuring up to 4.8 cm. There appears to be some gas progressing distally within the colon. Postsurgical changes within the right lower quadrant. No definite free intraperitoneal air. Atelectasis left lung base. Regional skeleton is unremarkable. Re- demonstrated densities projecting over the right hemi abdomen.  IMPRESSION: Persistent small bowel obstruction. Suggestion of some gas progressing distally into the colon.   Electronically Signed   By: Lovey Newcomer M.D.   On: 09/19/2013 10:49    Medications: . heparin  5,000 Units Subcutaneous 3 times per day  . methylPREDNISolone (SOLU-MEDROL) injection  60 mg Intravenous Q12H    Assessment/Plan 1.  SBO with Crohn's disease 2.  Resection of terminal ileum/right colon - D. Newman - 8/13/ 2003. Prior SB resection in 1998 3.  Asymptomatic gall stones (known at least since 2008)  4.  History of anxiety/depression  5.  Chronic foot pain that resolved on some vitamins that he is taking.   Plan:  Clears, medical management.  I pulled the NG.  Call us if we can assist.   LOS: 2 days    Earnstine Regal 09/20/2013

## 2013-09-20 NOTE — Progress Notes (Signed)
Subjective: Feeling much better.  5 BM (normal consistency for him) yesterday.  NG remains in place- he is ready to discontinue NG tube and start liquid diet.    Objective: Vital signs in last 24 hours: Temp:  [97.5 F (36.4 C)-98 F (36.7 C)] 97.8 F (36.6 C) (05/18 0544) Pulse Rate:  [58-68] 58 (05/18 0544) Resp:  [16-18] 16 (05/18 0544) BP: (102-109)/(67-68) 109/67 mmHg (05/18 0544) SpO2:  [94 %-97 %] 94 % (05/18 0544) Weight change:  Last BM Date: 2013-10-12  CBG (last 3)  No results found for this basename: GLUCAP,  in the last 72 hours  Intake/Output from previous day: 2022-10-13 0701 - 05/18 0700 In: 1456.8 [I.V.:1396.8; NG/GT:60] Out: 2200 [Urine:2000; Emesis/NG output:200] Intake/Output this shift:    General appearance: alert and no distress Eyes: no scleral icterus Throat: oropharynx moist without erythema; NG in place Resp: clear to auscultation bilaterally Cardio: regular rate and rhythm GI: soft, non-tender; bowel sounds normal; no masses,  no organomegaly Extremities: no clubbing, cyanosis or edema  Lab Results:  Recent Labs  09/18/13 0735 Oct 12, 2013 0450  NA 138 141  K 4.1 3.5*  CL 97 104  CO2 21 24  GLUCOSE 130* 134*  BUN 11 12  CREATININE 0.93 0.86  CALCIUM 9.6 8.8    Recent Labs  09/18/13 0735 10-12-13 0450  AST 45* 14  ALT 21 14  ALKPHOS 80 61  BILITOT 0.7 0.5  PROT 8.9* 6.8  ALBUMIN 4.0 3.0*    Recent Labs  09/18/13 0805 10-12-13 0450  WBC 16.1* 9.5  NEUTROABS 14.1*  --   HGB 15.8 13.4  HCT 45.6 38.5*  MCV 79.7 81.9  PLT 264 215    Studies/Results: Dg Abd 2 Views  2013/10/12   CLINICAL DATA:  Follow-up small bowel obstruction. History of Crohn's disease.  EXAM: ABDOMEN - 2 VIEW  COMPARISON:  DG ABDOMEN 1V dated 09/18/2013; CT ABD/PELVIS W CM dated 07/24/2013; DG ABD 2 VIEWS dated 07/26/2013  FINDINGS: NG tube tip and side-port project over the stomach. Re- demonstrated fluid fluid levels and dilated loops of small bowel within the  abdomen measuring up to 4.8 cm. There appears to be some gas progressing distally within the colon. Postsurgical changes within the right lower quadrant. No definite free intraperitoneal air. Atelectasis left lung base. Regional skeleton is unremarkable. Re- demonstrated densities projecting over the right hemi abdomen.  IMPRESSION: Persistent small bowel obstruction. Suggestion of some gas progressing distally into the colon.   Electronically Signed   By: Lovey Newcomer M.D.   On: 10-12-2013 10:49     Medications: Scheduled: . heparin  5,000 Units Subcutaneous 3 times per day  . methylPREDNISolone (SOLU-MEDROL) injection  60 mg Intravenous Q12H   Continuous: . 0.9 % NaCl with KCl 20 mEq / L 30 mL/hr at Oct 12, 2013 1133    Assessment/Plan: Principal Problem: 1. Small bowel obstruction secondary to Crohn's disease- Clinically improved on IV steroids consistent with prior flares.  KUB ordered for this am.  Will discontinue NG tube and start clear liquids and advance as tolerated today.   Emphasized the importance of compliance with maintenance therapy to prevent flares.  He plans to discuss with GI at [facility] on Thursday. 2. Disposition- Anticipate discharge tomorrow on oral steroids.    LOS: 2 days   Greg [name] 09/20/2013, 7:59 AM

## 2013-09-20 NOTE — Progress Notes (Signed)
ATTENDING ADDENDUM:  I personally reviewed patient's record, examined the patient, and formulated the following assessment and plan:  Doing better.  Will d/c NG and start clears.  Ok to transition to oral steroids.

## 2013-09-20 NOTE — Progress Notes (Signed)
Feeling well, with continued stool output (2 BM's so far today).  Abd non-distended, soft, nontender, with soft/non-obstructive bowel sounds.  No new blood tests.  KUB:  Scattered gas, felt to be similar to yesterday by radiologist, looks unimpressive to me.  IMPR:  Resolving recurrent SBO in patient w/ longstanding Crohn's ileitis and h/o several prior resections.  RECOMM:  Will defer to Surgery (Dr. Leighton Ruff), but I think pt is probably ready to have NG dc'd and start clr liq diet and start oral steroids.  Cleotis Nipper, M.D. [phone]

## 2013-09-21 MED ORDER — PREDNISONE 20 MG PO TABS: 20.0000 mg | ORAL_TABLET | Freq: Every day | ORAL | Status: DC

## 2013-09-21 MED ORDER — PREDNISONE 20 MG PO TABS
40.0000 mg | ORAL_TABLET | Freq: Every day | ORAL | Status: DC
  Administered 2013-09-21: 40 mg via ORAL
  Filled 2013-09-21 (×2): qty 2

## 2013-09-21 NOTE — Discharge Summary (Signed)
DISCHARGE SUMMARY  Greg [name]  MR#: [id number]  DOB:May 29, 1965  Date of Admission: 09/18/2013 Date of Discharge: 09/21/2013  Attending [place]  Patient's [name], Greg Saxon, MD  Consults: Greg Ng, MD-GI [facility]  Discharge Diagnoses: Principal Problem:   SBO (small bowel obstruction) Active Problems:   Acute on Chronic Crohn's disease  Discharge Medications:   Medication List         predniSONE 20 MG tablet  Commonly known as:  DELTASONE  Take 1 tablet (20 mg total) by mouth daily with breakfast.     traMADol 50 MG tablet  Commonly known as:  ULTRAM  Take 50 mg by mouth every 6 (six) hours as needed for moderate pain or severe pain.        Hospital Procedures: Dg Abd 1 View  09/20/2013   CLINICAL DATA:  Recent small bowel obstruction  EXAM: ABDOMEN - 1 VIEW  COMPARISON:  09/19/2013  FINDINGS: Scattered large and small bowel gas is noted. A few loops of dilated small bowel are noted in the left quadrant. Multiple gallstones are again identified.  IMPRESSION: No significant change from the prior exam.   Electronically Signed   By: Inez Catalina M.D.   On: 09/20/2013 08:25   Dg Abd 1 View  09/18/2013   CLINICAL DATA:  Left-sided abdominal pain.  History of Crohn's.  EXAM: ABDOMEN - 1 VIEW  COMPARISON:  07/26/2013  FINDINGS: Dilated fluid and gas filled small bowel in the left abdomen. The colon is relatively decompressed. Postsurgical changes in the right abdomen related to bowel resection at the ileocecal valve. Two gallstones again seen in the right upper quadrant. Ovoid densities in the right abdomen are presumably within the fecal stream given no urolithiasis seen on comparison imaging. Lung bases clear. No acute osseous findings.  IMPRESSION: 1. Small bowel obstruction. 2. Cholelithiasis.   Electronically Signed   By: Jorje Guild M.D.   On: 09/18/2013 07:59   Dg Abd 2 Views  09/19/2013   CLINICAL DATA:  Follow-up small  bowel obstruction. History of Crohn's disease.  EXAM: ABDOMEN - 2 VIEW  COMPARISON:  DG ABDOMEN 1V dated 09/18/2013; CT ABD/PELVIS W CM dated 07/24/2013; DG ABD 2 VIEWS dated 07/26/2013  FINDINGS: [name] tube tip and side-port project over the stomach. Re- demonstrated fluid fluid levels and dilated loops of small bowel within the abdomen measuring up to 4.8 cm. There appears to be some gas progressing distally within the colon. Postsurgical changes within the right lower quadrant. No definite free intraperitoneal air. Atelectasis left lung base. Regional skeleton is unremarkable. Re- demonstrated densities projecting over the right hemi abdomen.  IMPRESSION: Persistent small bowel obstruction. Suggestion of some gas progressing distally into the colon.   Electronically Signed   By: Lovey Newcomer M.D.   On: 09/19/2013 10:49   RECENT STUDIES: CLINICAL DATA: Pain, nausea, vomiting  EXAM:  CT ABDOMEN AND PELVIS WITH CONTRAST  TECHNIQUE:  Multidetector CT imaging of the abdomen and pelvis was performed  using the standard protocol following bolus administration of  intravenous contrast.  CONTRAST: 69m OMNIPAQUE IOHEXOL 300 MG/ML SOLN, 1059mOMNIPAQUE  IOHEXOL 300 MG/ML SOLN  COMPARISON: CT ABD/PELVIS W CM dated 03/08/2011  FINDINGS:  The lung bases are unremarkable  The liver, spleen, adrenals, pancreas are unremarkable. Benign  Bosniak 1 cysts appreciated within the right and left kidneys.  Kidneys otherwise unremarkable. Calcified gallstones appreciated  within the dependent portion of the gallbladder.  Bowel wall thickening is appreciated at  the level of the anastomosis  of the small and large bowel image 40 through 49 series 2. There is  mild inflammatory change within the adjacent fat. When compared to  previous study these findings are less severe. There is no evidence  of pneumoperitoneum no associated loculated fluid collections. The  small bowel proximal to this finding is dilated. Stable  postsurgical  changes are appreciated within the right pericolic gutter region.  Involving the proximal portion of the ascending colon. Small sub cm  reactive mesenteric lymph nodes are identified within the abdomen.  There is no evidence of abdominal or pelvic masses.  Small amount of free fluid projects within the posterior base of the  pelvis, likely reactive.  There is no evidence of abdominal aortic aneurysm. The celiac, SMA,  IMA, portal veins are opacified.  There is no evidence of aggressive appearing osseous lesions.  There is no evidence of an abdominal wall nor inguinal hernia.  IMPRESSION:  1. Inflammatory changes and the anastomotic site of the small bowel  and colon with an associated small bowel obstruction. Considering  the patient's history these findings are consistent with active  Crohn's disease. When compared to the previous study the severity of  these findings have improved.  2. Bilateral Bosniak type 1 renal cysts  3. Gallstones  Electronically Signed:  By: Margaree Mackintosh M.D.  On: 07/24/2013 11:32  ADDENDUM REPORT: 07/26/2013 10:32  ADDENDUM:  Revaluation of the above CT was performed with added history of  palpable abnormality and clinical concern at the base the of penis.  Evaluation was also correlated with a discussion with Dr.Olson and  comparison to prior study 11/08/2006.  An area of inflammatory change is appreciated within the  subcutaneous fat of the lower pelvis just anterior to the base of  the penis containing a central area of fluid attenuation. Dr. Jeannine Kitten  discussed these findings with Dr. Marlou Starks.  Further evaluation of this finding with contrasted MRI is  recommended, for further characterization. Differential  considerations include: chronic inflammatory change with possible  areas of scarring and reactive fluid, phlegmonous change and/or  abscess, or possibly even a fistula considering the patient's  history of Crohn's disease.   Electronically Signed  By: Margaree Mackintosh M.D.  On: 07/26/2013 10:32   History of Present Illness: Greg [name] is a 48 year old white male with a history of Crohn's disease status post ileal resection x2, history of perirectal abscess, and recurrent small bowel structure to presented to the emergency department with a complaint of acute onset abdominal pain and recurrent nausea and vomiting. He has had multiple admissions in the past for small bowel structure and. Most recently, was admitted from 3/21 33/24 for similar symptoms. He was treated supportively at that time with [name] suction, n.p.o. status, and IV steroids with resolution of his symptoms. He weaned on his steroids about 3 weeks prior to this admission. He elected not to restart his 6 MP due to concern over malignancy.  He states that he has taken it sporadically, consisting of about 10% over the past year. He stated he was in excellent health between hospitalizations. However, developed acute onset symptoms that awoke him around 2 AM with no preceding symptoms. Workup in the emergency department was consistent with recurrent small bowel structure and with gas-filled small bowel in the left abdomen. He was admitted for further management.  Hospital Course: Greg [name] was admitted to a medical bed.  [name] was placed and he was treated with IV Solu-Medrol and made n.p.o.  He received IV fluid hydration. General surgery and GI were consulted and agreed with conservative management. He had rapid improvement in his abdominal pain with no recurrent nausea or vomiting throughout the remainder of his hospitalization. His bowel function remained typical for him consisting of about 5 loose stools per day. His [name] tube was discontinued the day prior to discharge and he was started on a clear diet which was quickly advanced to a regular diet which he is tolerating well without abdominal pain. This pattern is very consistent with his prior flares and suggestive of an active  focus of Crohn's superimposed on probable adhesions. I've emphasized the importance of maintenance therapy with him. He is scheduled to see GI at [facility] in 2 days for a second opinion. He remains averse to the idea of biologics, concerned that they will require lifelong therapy. I encouraged him to be open minded about this option, emphasizing that he has a lifelong condition that will require therapy to prevent repeated exacerbations. He will be discharged on a prednisone taper. Advised him to discuss with GI at [facility] prior to discontinuation.  Day of Discharge Exam BP 114/58  Pulse 58  Temp(Src) 98 F (36.7 C) (Oral)  Resp 18  Ht 6' 2"  (1.88 m)  Wt 93.985 kg (207 lb 3.2 oz)  BMI 26.59 kg/m2  SpO2 94%  Physical Exam: General appearance: alert and no distress Eyes: no scleral icterus Throat: oropharynx moist without erythema Resp: clear to auscultation bilaterally Cardio: regular rate and rhythm GI: soft, non-tender; bowel sounds normal; no masses,  no organomegaly Extremities: no clubbing, cyanosis or edema  Discharge Labs:  Recent Labs  09/19/13 0450  NA 141  K 3.5*  CL 104  CO2 24  GLUCOSE 134*  BUN 12  CREATININE 0.86  CALCIUM 8.8    Recent Labs  09/19/13 0450  AST 14  ALT 14  ALKPHOS 61  BILITOT 0.5  PROT 6.8  ALBUMIN 3.0*    Recent Labs  09/18/13 0805 09/19/13 0450  WBC 16.1* 9.5  NEUTROABS 14.1*  --   HGB 15.8 13.4  HCT 45.6 38.5*  MCV 79.7 81.9  PLT 264 215    Discharge instructions:     Discharge Instructions   Activity as tolerated - No restrictions    Complete by:  As directed      Diet general    Complete by:  As directed      Discharge instructions    Complete by:  As directed   Call if you develop increased abdominal pain, nausea/vomitting, blood in stools.  Discuss maintenance regimen with GI MD at [facility]- strongly encourage maintenance therapy to prevent recurrent bowel obstruction           Disposition: to home  Follow-up  Appts: Follow-up with Dr. Brigitte Pulse, PCP at [facility] as needed.  Follow-up with Dr. Bradly Chris at [facility] on 5/21 as scheduled.  Local GI is Dr. Cristina Gong  Condition on Discharge: Stable/Improved  Tests Needing Follow-up: None  Signed: Marton Redwood 09/21/2013, 7:40 AM

## 2013-09-21 NOTE — Discharge Instructions (Signed)
Crohn's Disease Crohn's disease is a long-term (chronic) soreness and redness (inflammation) of the intestines (bowel). It can affect any portion of the digestive tract, from the mouth to the anus. It can also cause problems outside the digestive tract. Crohn's disease is closely related to a disease called ulcerative colitis (together, these two diseases are called inflammatory bowel disease).  CAUSES  The cause of Crohn's disease is not known. One Link Snuffer is that, in an easily affected person, the immune system is triggered to attack the body's own digestive tissue. Crohn's disease runs in families. It seems to be more common in certain geographic areas and amongst certain races. There are no clear-cut dietary causes.  SYMPTOMS  Crohn's disease can cause many different symptoms since it can affect many different parts of the body. Symptoms include:  Fatigue.  Weight loss.  Chronic diarrhea, sometime bloody.  Abdominal pain and cramps.  Fever.  Ulcers or canker sores in the mouth or rectum.  Anemia (low red blood cells).  Arthritis, skin problems, and eye problems may occur. Complications of Crohn's disease can include:  Series of holes (perforation) of the bowel.  Portions of the intestines sticking to each other (adhesions).  Obstruction of the bowel.  Fistula formation, typically in the rectal area but also in other areas. A fistula is an opening between the bowels and the outside, or between the bowels and another organ.  A painful crack in the mucous membrane of the anus (rectal fissure). DIAGNOSIS  Your caregiver may suspect Crohn's disease based on your symptoms and an exam. Blood tests may confirm that there is a problem. You may be asked to submit a stool specimen for examination. X-rays and CT scans may be necessary. Ultimately, the diagnosis is usually made after a procedure that uses a flexible tube that is inserted via your mouth or your anus. This is done under  sedation and is called either an upper endoscopy or colonoscopy. With these tests, the specialist can take tiny tissue samples and remove them from the inside of the bowel (biopsy). Examination of this biopsy tissue under a microscope can reveal Crohn's disease as the cause of your symptoms. Due to the many different forms that Crohn's disease can take, symptoms may be present for several years before a diagnosis is made. TREATMENT  Medications are often used to decrease inflammation and control the immune system. These include medicines related to aspirin, steroid medications, and newer and stronger medications to slow down the immune system. Some medications may be used as suppositories or enemas. A number of other medications are used or have been studied. Your caregiver will make specific recommendations. HOME CARE INSTRUCTIONS   Symptoms such as diarrhea can be controlled with medications. Avoid foods that have a laxative effect such as fresh fruit, vegetables and dairy products. During flare ups, you can rest your bowel by refraining from solid foods. Drink clear liquids frequently during the day (electrolyte or re-hydrating fluids are best. Your caregiver can help you with suggestions). Drink often to prevent loss of body fluids (dehydration). When diarrhea has cleared, eat small meals and more frequently. Avoid food additives and stimulants such as caffeine (coffee, tea, or chocolate). Enzyme supplements may help if you develop intolerance to a sugar in dairy products (lactose). Ask your caregiver or dietitian about specific dietary instructions.  Try to maintain a positive attitude. Learn relaxation techniques such as self hypnosis, mental imaging, and muscle relaxation.  If possible, avoid stresses which can aggravate your condition.  Exercise regularly.  Follow your diet.  Always get plenty of rest. SEEK MEDICAL CARE IF:   Your symptoms fail to improve after a week or two of new  treatment.  You experience continued weight loss.  You have ongoing cramps or loose bowels.  You develop a new skin rash, skin sores, or eye problems. SEEK IMMEDIATE MEDICAL CARE IF:   You have worsening of your symptoms or develop new symptoms.  You have a fever.  You develop bloody diarrhea.  You develop severe abdominal pain. MAKE SURE YOU:   Understand these instructions.  Will watch your condition.  Will get help right away if you are not doing well or get worse. Document Released: 01/30/2005 Document Revised: 08/17/2012 Document Reviewed: 12/29/2006 [facility] Patient Information 2014 [place], [place].

## 2014-10-10 DIAGNOSIS — G4700 Insomnia, unspecified: Secondary | ICD-10-CM | POA: Diagnosis present

## 2014-12-01 ENCOUNTER — Other Ambulatory Visit: Payer: Self-pay | Admitting: Physician Assistant

## 2015-08-30 DIAGNOSIS — K50012 Crohn's disease of small intestine with intestinal obstruction: Secondary | ICD-10-CM | POA: Diagnosis not present

## 2015-10-11 DIAGNOSIS — K50012 Crohn's disease of small intestine with intestinal obstruction: Secondary | ICD-10-CM | POA: Diagnosis not present

## 2015-11-30 DIAGNOSIS — Z0000 Encounter for general adult medical examination without abnormal findings: Secondary | ICD-10-CM | POA: Diagnosis not present

## 2015-11-30 DIAGNOSIS — Z125 Encounter for screening for malignant neoplasm of prostate: Secondary | ICD-10-CM | POA: Diagnosis not present

## 2015-12-13 DIAGNOSIS — F430 Acute stress reaction: Secondary | ICD-10-CM | POA: Diagnosis not present

## 2015-12-13 DIAGNOSIS — Z0000 Encounter for general adult medical examination without abnormal findings: Secondary | ICD-10-CM | POA: Diagnosis not present

## 2015-12-13 DIAGNOSIS — R0789 Other chest pain: Secondary | ICD-10-CM | POA: Diagnosis not present

## 2015-12-13 DIAGNOSIS — K603 Anal fistula: Secondary | ICD-10-CM | POA: Diagnosis not present

## 2015-12-13 DIAGNOSIS — Z1389 Encounter for screening for other disorder: Secondary | ICD-10-CM | POA: Diagnosis not present

## 2015-12-13 DIAGNOSIS — F410 Panic disorder [episodic paroxysmal anxiety] without agoraphobia: Secondary | ICD-10-CM | POA: Diagnosis not present

## 2015-12-13 DIAGNOSIS — K5090 Crohn's disease, unspecified, without complications: Secondary | ICD-10-CM | POA: Diagnosis not present

## 2015-12-15 DIAGNOSIS — Z0000 Encounter for general adult medical examination without abnormal findings: Secondary | ICD-10-CM | POA: Diagnosis not present

## 2015-12-18 DIAGNOSIS — Z1212 Encounter for screening for malignant neoplasm of rectum: Secondary | ICD-10-CM | POA: Diagnosis not present

## 2016-03-07 DIAGNOSIS — H52223 Regular astigmatism, bilateral: Secondary | ICD-10-CM | POA: Diagnosis not present

## 2016-03-08 DIAGNOSIS — K603 Anal fistula: Secondary | ICD-10-CM | POA: Diagnosis not present

## 2016-03-08 DIAGNOSIS — K50012 Crohn's disease of small intestine with intestinal obstruction: Secondary | ICD-10-CM | POA: Diagnosis not present

## 2016-07-31 DIAGNOSIS — K50113 Crohn's disease of large intestine with fistula: Secondary | ICD-10-CM | POA: Diagnosis not present

## 2016-10-15 DIAGNOSIS — K50012 Crohn's disease of small intestine with intestinal obstruction: Secondary | ICD-10-CM | POA: Diagnosis not present

## 2016-11-13 DIAGNOSIS — K5000 Crohn's disease of small intestine without complications: Secondary | ICD-10-CM | POA: Diagnosis not present

## 2016-11-13 DIAGNOSIS — Z8052 Family history of malignant neoplasm of bladder: Secondary | ICD-10-CM | POA: Diagnosis not present

## 2016-11-13 DIAGNOSIS — K50913 Crohn's disease, unspecified, with fistula: Secondary | ICD-10-CM | POA: Diagnosis not present

## 2016-11-13 DIAGNOSIS — Z79899 Other long term (current) drug therapy: Secondary | ICD-10-CM | POA: Diagnosis not present

## 2016-11-13 DIAGNOSIS — Z23 Encounter for immunization: Secondary | ICD-10-CM | POA: Diagnosis not present

## 2016-11-13 DIAGNOSIS — Z6829 Body mass index (BMI) 29.0-29.9, adult: Secondary | ICD-10-CM | POA: Diagnosis not present

## 2016-12-11 DIAGNOSIS — Z125 Encounter for screening for malignant neoplasm of prostate: Secondary | ICD-10-CM | POA: Diagnosis not present

## 2016-12-11 DIAGNOSIS — Z0000 Encounter for general adult medical examination without abnormal findings: Secondary | ICD-10-CM | POA: Diagnosis not present

## 2016-12-19 DIAGNOSIS — K603 Anal fistula: Secondary | ICD-10-CM | POA: Diagnosis not present

## 2016-12-19 DIAGNOSIS — G4709 Other insomnia: Secondary | ICD-10-CM | POA: Diagnosis not present

## 2016-12-19 DIAGNOSIS — F430 Acute stress reaction: Secondary | ICD-10-CM | POA: Diagnosis not present

## 2016-12-19 DIAGNOSIS — Z0000 Encounter for general adult medical examination without abnormal findings: Secondary | ICD-10-CM | POA: Diagnosis not present

## 2016-12-19 DIAGNOSIS — Z1389 Encounter for screening for other disorder: Secondary | ICD-10-CM | POA: Diagnosis not present

## 2016-12-19 DIAGNOSIS — K5090 Crohn's disease, unspecified, without complications: Secondary | ICD-10-CM | POA: Diagnosis not present

## 2016-12-30 DIAGNOSIS — Z1212 Encounter for screening for malignant neoplasm of rectum: Secondary | ICD-10-CM | POA: Diagnosis not present

## 2017-06-05 DIAGNOSIS — K50012 Crohn's disease of small intestine with intestinal obstruction: Secondary | ICD-10-CM | POA: Diagnosis not present

## 2017-06-05 DIAGNOSIS — K50113 Crohn's disease of large intestine with fistula: Secondary | ICD-10-CM | POA: Diagnosis not present

## 2017-07-10 DIAGNOSIS — R197 Diarrhea, unspecified: Secondary | ICD-10-CM | POA: Diagnosis not present

## 2017-07-10 DIAGNOSIS — K6289 Other specified diseases of anus and rectum: Secondary | ICD-10-CM | POA: Diagnosis not present

## 2017-07-10 DIAGNOSIS — Z6829 Body mass index (BMI) 29.0-29.9, adult: Secondary | ICD-10-CM | POA: Diagnosis not present

## 2017-07-10 DIAGNOSIS — D849 Immunodeficiency, unspecified: Secondary | ICD-10-CM | POA: Diagnosis not present

## 2017-07-10 DIAGNOSIS — K50913 Crohn's disease, unspecified, with fistula: Secondary | ICD-10-CM | POA: Diagnosis not present

## 2017-07-30 DIAGNOSIS — K633 Ulcer of intestine: Secondary | ICD-10-CM | POA: Diagnosis not present

## 2017-07-30 DIAGNOSIS — K9189 Other postprocedural complications and disorders of digestive system: Secondary | ICD-10-CM | POA: Diagnosis not present

## 2017-07-30 DIAGNOSIS — K5000 Crohn's disease of small intestine without complications: Secondary | ICD-10-CM | POA: Diagnosis not present

## 2017-07-30 DIAGNOSIS — K5289 Other specified noninfective gastroenteritis and colitis: Secondary | ICD-10-CM | POA: Diagnosis not present

## 2017-07-31 DIAGNOSIS — K5289 Other specified noninfective gastroenteritis and colitis: Secondary | ICD-10-CM | POA: Diagnosis not present

## 2017-08-06 DIAGNOSIS — K5289 Other specified noninfective gastroenteritis and colitis: Secondary | ICD-10-CM | POA: Diagnosis not present

## 2017-08-11 DIAGNOSIS — E559 Vitamin D deficiency, unspecified: Secondary | ICD-10-CM | POA: Diagnosis not present

## 2017-08-11 DIAGNOSIS — E538 Deficiency of other specified B group vitamins: Secondary | ICD-10-CM | POA: Diagnosis not present

## 2017-08-14 DIAGNOSIS — Z6829 Body mass index (BMI) 29.0-29.9, adult: Secondary | ICD-10-CM | POA: Diagnosis not present

## 2017-08-14 DIAGNOSIS — K50913 Crohn's disease, unspecified, with fistula: Secondary | ICD-10-CM | POA: Diagnosis not present

## 2017-09-11 ENCOUNTER — Other Ambulatory Visit: Payer: Self-pay | Admitting: Gastroenterology

## 2017-09-11 ENCOUNTER — Ambulatory Visit
Admission: RE | Admit: 2017-09-11 | Discharge: 2017-09-11 | Disposition: A | Discharge status: Home / Self Care | Payer: BLUE CROSS/BLUE SHIELD | Source: Ambulatory Visit | Attending: Gastroenterology | Admitting: Gastroenterology

## 2017-09-11 DIAGNOSIS — K50919 Crohn's disease, unspecified, with unspecified complications: Secondary | ICD-10-CM

## 2017-09-11 DIAGNOSIS — K50012 Crohn's disease of small intestine with intestinal obstruction: Secondary | ICD-10-CM | POA: Diagnosis not present

## 2017-09-11 DIAGNOSIS — Z01818 Encounter for other preprocedural examination: Secondary | ICD-10-CM | POA: Diagnosis not present

## 2017-09-11 DIAGNOSIS — K50913 Crohn's disease, unspecified, with fistula: Secondary | ICD-10-CM | POA: Diagnosis not present

## 2017-10-23 DIAGNOSIS — K50012 Crohn's disease of small intestine with intestinal obstruction: Secondary | ICD-10-CM | POA: Diagnosis not present

## 2017-10-23 DIAGNOSIS — K50913 Crohn's disease, unspecified, with fistula: Secondary | ICD-10-CM | POA: Diagnosis not present

## 2017-12-17 DIAGNOSIS — Z125 Encounter for screening for malignant neoplasm of prostate: Secondary | ICD-10-CM | POA: Diagnosis not present

## 2017-12-17 DIAGNOSIS — Z0000 Encounter for general adult medical examination without abnormal findings: Secondary | ICD-10-CM | POA: Diagnosis not present

## 2017-12-24 DIAGNOSIS — K5090 Crohn's disease, unspecified, without complications: Secondary | ICD-10-CM | POA: Diagnosis not present

## 2017-12-24 DIAGNOSIS — Z1389 Encounter for screening for other disorder: Secondary | ICD-10-CM | POA: Diagnosis not present

## 2017-12-24 DIAGNOSIS — E781 Pure hyperglyceridemia: Secondary | ICD-10-CM | POA: Diagnosis not present

## 2017-12-24 DIAGNOSIS — Z0000 Encounter for general adult medical examination without abnormal findings: Secondary | ICD-10-CM | POA: Diagnosis not present

## 2017-12-24 DIAGNOSIS — K603 Anal fistula: Secondary | ICD-10-CM | POA: Diagnosis not present

## 2017-12-26 DIAGNOSIS — Z1212 Encounter for screening for malignant neoplasm of rectum: Secondary | ICD-10-CM | POA: Diagnosis not present

## 2018-01-07 DIAGNOSIS — R740 Nonspecific elevation of levels of transaminase and lactic acid dehydrogenase [LDH]: Secondary | ICD-10-CM | POA: Diagnosis not present

## 2018-03-24 DIAGNOSIS — K50012 Crohn's disease of small intestine with intestinal obstruction: Secondary | ICD-10-CM | POA: Diagnosis not present

## 2018-03-24 DIAGNOSIS — K50913 Crohn's disease, unspecified, with fistula: Secondary | ICD-10-CM | POA: Diagnosis not present

## 2018-05-20 DIAGNOSIS — M7711 Lateral epicondylitis, right elbow: Secondary | ICD-10-CM | POA: Diagnosis not present

## 2018-05-20 DIAGNOSIS — M25521 Pain in right elbow: Secondary | ICD-10-CM | POA: Diagnosis not present

## 2018-07-15 DIAGNOSIS — K50113 Crohn's disease of large intestine with fistula: Secondary | ICD-10-CM | POA: Diagnosis not present

## 2018-09-07 DIAGNOSIS — K50113 Crohn's disease of large intestine with fistula: Secondary | ICD-10-CM | POA: Diagnosis not present

## 2018-09-07 DIAGNOSIS — K50012 Crohn's disease of small intestine with intestinal obstruction: Secondary | ICD-10-CM | POA: Diagnosis not present

## 2018-10-13 DIAGNOSIS — K50113 Crohn's disease of large intestine with fistula: Secondary | ICD-10-CM | POA: Diagnosis not present

## 2018-10-13 DIAGNOSIS — K50012 Crohn's disease of small intestine with intestinal obstruction: Secondary | ICD-10-CM | POA: Diagnosis not present

## 2018-10-24 DIAGNOSIS — L259 Unspecified contact dermatitis, unspecified cause: Secondary | ICD-10-CM | POA: Diagnosis not present

## 2018-12-23 DIAGNOSIS — K5000 Crohn's disease of small intestine without complications: Secondary | ICD-10-CM | POA: Diagnosis not present

## 2018-12-23 DIAGNOSIS — K6389 Other specified diseases of intestine: Secondary | ICD-10-CM | POA: Diagnosis not present

## 2018-12-23 DIAGNOSIS — Z980 Intestinal bypass and anastomosis status: Secondary | ICD-10-CM | POA: Diagnosis not present

## 2018-12-23 DIAGNOSIS — K603 Anal fistula: Secondary | ICD-10-CM | POA: Diagnosis not present

## 2018-12-23 DIAGNOSIS — K5289 Other specified noninfective gastroenteritis and colitis: Secondary | ICD-10-CM | POA: Diagnosis not present

## 2018-12-23 DIAGNOSIS — K624 Stenosis of anus and rectum: Secondary | ICD-10-CM | POA: Diagnosis not present

## 2018-12-24 DIAGNOSIS — E781 Pure hyperglyceridemia: Secondary | ICD-10-CM | POA: Diagnosis not present

## 2018-12-24 DIAGNOSIS — Z0000 Encounter for general adult medical examination without abnormal findings: Secondary | ICD-10-CM | POA: Diagnosis not present

## 2018-12-24 DIAGNOSIS — Z125 Encounter for screening for malignant neoplasm of prostate: Secondary | ICD-10-CM | POA: Diagnosis not present

## 2018-12-24 DIAGNOSIS — Z23 Encounter for immunization: Secondary | ICD-10-CM | POA: Diagnosis not present

## 2018-12-31 DIAGNOSIS — E781 Pure hyperglyceridemia: Secondary | ICD-10-CM | POA: Diagnosis not present

## 2018-12-31 DIAGNOSIS — F430 Acute stress reaction: Secondary | ICD-10-CM | POA: Diagnosis not present

## 2018-12-31 DIAGNOSIS — Z0000 Encounter for general adult medical examination without abnormal findings: Secondary | ICD-10-CM | POA: Diagnosis not present

## 2018-12-31 DIAGNOSIS — Z1331 Encounter for screening for depression: Secondary | ICD-10-CM | POA: Diagnosis not present

## 2018-12-31 DIAGNOSIS — K603 Anal fistula: Secondary | ICD-10-CM | POA: Diagnosis not present

## 2018-12-31 DIAGNOSIS — K5090 Crohn's disease, unspecified, without complications: Secondary | ICD-10-CM | POA: Diagnosis not present

## 2019-01-14 DIAGNOSIS — K50113 Crohn's disease of large intestine with fistula: Secondary | ICD-10-CM | POA: Diagnosis not present

## 2019-01-21 DIAGNOSIS — K50014 Crohn's disease of small intestine with abscess: Secondary | ICD-10-CM | POA: Diagnosis not present

## 2019-01-21 DIAGNOSIS — D899 Disorder involving the immune mechanism, unspecified: Secondary | ICD-10-CM | POA: Diagnosis not present

## 2019-01-28 DIAGNOSIS — Z8619 Personal history of other infectious and parasitic diseases: Secondary | ICD-10-CM | POA: Diagnosis not present

## 2019-01-28 DIAGNOSIS — E781 Pure hyperglyceridemia: Secondary | ICD-10-CM | POA: Diagnosis not present

## 2019-01-28 DIAGNOSIS — R0789 Other chest pain: Secondary | ICD-10-CM | POA: Diagnosis not present

## 2019-02-03 ENCOUNTER — Other Ambulatory Visit: Payer: Self-pay | Admitting: Gastroenterology

## 2019-02-03 DIAGNOSIS — K50012 Crohn's disease of small intestine with intestinal obstruction: Secondary | ICD-10-CM

## 2019-02-10 ENCOUNTER — Other Ambulatory Visit: Payer: Self-pay

## 2019-02-10 ENCOUNTER — Ambulatory Visit
Admission: RE | Admit: 2019-02-10 | Discharge: 2019-02-10 | Disposition: A | Discharge status: Home / Self Care | Payer: BLUE CROSS/BLUE SHIELD | Source: Ambulatory Visit | Attending: Gastroenterology | Admitting: Gastroenterology

## 2019-02-10 DIAGNOSIS — K529 Noninfective gastroenteritis and colitis, unspecified: Secondary | ICD-10-CM | POA: Diagnosis not present

## 2019-02-10 DIAGNOSIS — K5090 Crohn's disease, unspecified, without complications: Secondary | ICD-10-CM | POA: Diagnosis not present

## 2019-02-10 DIAGNOSIS — K50012 Crohn's disease of small intestine with intestinal obstruction: Secondary | ICD-10-CM

## 2019-02-10 MED ORDER — IOPAMIDOL (ISOVUE-300) INJECTION 61%
100.0000 mL | Freq: Once | INTRAVENOUS | Status: AC | PRN
  Administered 2019-02-10: 100 mL via INTRAVENOUS

## 2019-02-25 DIAGNOSIS — K50113 Crohn's disease of large intestine with fistula: Secondary | ICD-10-CM | POA: Diagnosis not present

## 2019-03-01 DIAGNOSIS — K529 Noninfective gastroenteritis and colitis, unspecified: Secondary | ICD-10-CM | POA: Diagnosis not present

## 2019-03-01 DIAGNOSIS — K50113 Crohn's disease of large intestine with fistula: Secondary | ICD-10-CM | POA: Diagnosis not present

## 2019-03-01 DIAGNOSIS — R935 Abnormal findings on diagnostic imaging of other abdominal regions, including retroperitoneum: Secondary | ICD-10-CM | POA: Diagnosis not present

## 2019-04-27 DIAGNOSIS — R7401 Elevation of levels of liver transaminase levels: Secondary | ICD-10-CM | POA: Diagnosis not present

## 2019-04-27 DIAGNOSIS — E781 Pure hyperglyceridemia: Secondary | ICD-10-CM | POA: Diagnosis not present

## 2019-07-15 DIAGNOSIS — H6122 Impacted cerumen, left ear: Secondary | ICD-10-CM | POA: Diagnosis not present

## 2019-07-23 ENCOUNTER — Ambulatory Visit: Payer: Self-pay | Attending: Internal Medicine

## 2019-07-23 DIAGNOSIS — Z23 Encounter for immunization: Secondary | ICD-10-CM

## 2019-07-23 NOTE — Progress Notes (Signed)
   Covid-19 Vaccination Clinic  Name:  Greg [name]    MRN: [id number] DOB: [birth year]  07/23/2019  [name] was observed post Covid-19 immunization for 15 minutes without incident. He was provided with Vaccine Information Sheet and instruction to access the V-Safe system.   [name] was instructed to call 911 with any severe reactions post vaccine: Marland Kitchen Difficulty breathing  . Swelling of face and throat  . A fast heartbeat  . A bad rash all over body  . Dizziness and weakness   Immunizations Administered    Name Date Dose VIS Date Route   Pfizer COVID-19 Vaccine 07/23/2019  8:49 AM 0.3 mL 04/16/2019 Intramuscular   Manufacturer: [place]   Lot: [id number]   [place]: [id number]

## 2019-07-27 DIAGNOSIS — K50012 Crohn's disease of small intestine with intestinal obstruction: Secondary | ICD-10-CM | POA: Diagnosis not present

## 2019-07-27 DIAGNOSIS — K603 Anal fistula: Secondary | ICD-10-CM | POA: Diagnosis not present

## 2019-07-27 DIAGNOSIS — K529 Noninfective gastroenteritis and colitis, unspecified: Secondary | ICD-10-CM | POA: Diagnosis not present

## 2019-07-27 DIAGNOSIS — K50113 Crohn's disease of large intestine with fistula: Secondary | ICD-10-CM | POA: Diagnosis not present

## 2019-07-29 DIAGNOSIS — K603 Anal fistula: Secondary | ICD-10-CM | POA: Diagnosis not present

## 2019-08-17 ENCOUNTER — Ambulatory Visit: Payer: Self-pay | Attending: Internal Medicine

## 2019-08-17 DIAGNOSIS — Z23 Encounter for immunization: Secondary | ICD-10-CM

## 2019-08-17 NOTE — Progress Notes (Signed)
   Covid-19 Vaccination Clinic  Name:  Greg [name]    MRN: [id number] DOB: [birth year]  08/17/2019  [name] was observed post Covid-19 immunization for 15 minutes without incident. He was provided with Vaccine Information Sheet and instruction to access the V-Safe system.   [name] was instructed to call 911 with any severe reactions post vaccine: Marland Kitchen Difficulty breathing  . Swelling of face and throat  . A fast heartbeat  . A bad rash all over body  . Dizziness and weakness   Immunizations Administered    Name Date Dose VIS Date Route   Pfizer COVID-19 Vaccine 08/17/2019  9:29 AM 0.3 mL 04/16/2019 Intramuscular   Manufacturer: [place]   Lot: [id number]   [place]: [id number]

## 2019-10-23 DIAGNOSIS — H6123 Impacted cerumen, bilateral: Secondary | ICD-10-CM | POA: Diagnosis not present

## 2019-10-23 DIAGNOSIS — H938X3 Other specified disorders of ear, bilateral: Secondary | ICD-10-CM | POA: Diagnosis not present

## 2020-07-20 ENCOUNTER — Ambulatory Visit: Payer: BC Managed Care – PPO | Admitting: Physician Assistant

## 2020-12-28 ENCOUNTER — Ambulatory Visit (INDEPENDENT_AMBULATORY_CARE_PROVIDER_SITE_OTHER): Payer: 59 | Admitting: Physician Assistant

## 2020-12-28 ENCOUNTER — Other Ambulatory Visit: Payer: Self-pay

## 2020-12-28 ENCOUNTER — Encounter: Payer: Self-pay | Admitting: Physician Assistant

## 2020-12-28 DIAGNOSIS — B9689 Other specified bacterial agents as the cause of diseases classified elsewhere: Secondary | ICD-10-CM

## 2020-12-28 DIAGNOSIS — L089 Local infection of the skin and subcutaneous tissue, unspecified: Secondary | ICD-10-CM

## 2020-12-28 MED ORDER — MUPIROCIN 2 % EX OINT: 1.0000 "application " | TOPICAL_OINTMENT | Freq: Two times a day (BID) | CUTANEOUS | 4 refills | Status: DC

## 2020-12-28 MED ORDER — TRIAMCINOLONE ACETONIDE 0.1 % EX CREA: 1.0000 "application " | TOPICAL_CREAM | Freq: Two times a day (BID) | CUTANEOUS | 2 refills | Status: DC | PRN

## 2020-12-28 NOTE — Progress Notes (Signed)
   New Patient   Subjective  Greg [name] is a 55 y.o. male who presents for the following: Annual Exam (No history of atypia or skin cancer, bumps around the neck may be shave bumps).   The following portions of the chart were reviewed this encounter and updated as appropriate:  Tobacco  Allergies  Meds  Problems  Med Hx  Surg Hx  Fam Hx      Objective  Well appearing patient in no apparent distress; mood and affect are within normal limits.  All skin waist up examined.  Right Anterior Neck Numerous brown plaques with redness and xerosis. No drainage.  Assessment & Plan  Skin infection, bacterial Right Anterior Neck  Use a single blade razor.  Anaerobic and Aerobic Culture - Right Anterior Neck  mupirocin ointment (BACTROBAN) 2 % - Right Anterior Neck Apply 1 application topically 2 (two) times daily.  triamcinolone cream (KENALOG) 0.1 % - Right Anterior Neck Apply 1 application topically 2 (two) times daily as needed.    I, Debrina Kizer, PA-C, have reviewed all documentation's for this visit.  The documentation on 12/28/20 for the exam, diagnosis, procedures and orders are all accurate and complete.

## 2020-12-28 NOTE — Patient Instructions (Signed)

## 2021-01-02 ENCOUNTER — Telehealth: Payer: Self-pay

## 2021-01-02 NOTE — Telephone Encounter (Signed)
-----   Message from Warren Danes, [place] sent at 01/02/2021 11:55 AM EDT ----- He was provided with mupirocin. If not improved. Rx: doxy 100 BID x 28 0RF

## 2021-01-02 NOTE — Telephone Encounter (Signed)
Phone call from patient returning our call for his culture results.  Patient aware of results. Per patient he's doing well.

## 2021-01-02 NOTE — Telephone Encounter (Signed)
Left message to call office

## 2021-01-03 ENCOUNTER — Telehealth: Payer: Self-pay | Admitting: Physician Assistant

## 2021-01-03 LAB — ANAEROBIC AND AEROBIC CULTURE
MICRO NUMBER:: 12291018
MICRO NUMBER:: 12291019
SPECIMEN QUALITY:: ADEQUATE
SPECIMEN QUALITY:: ADEQUATE

## 2021-02-21 ENCOUNTER — Ambulatory Visit: Payer: 59 | Admitting: Physician Assistant

## 2021-02-28 ENCOUNTER — Encounter: Payer: Self-pay | Admitting: Physician Assistant

## 2021-02-28 ENCOUNTER — Other Ambulatory Visit: Payer: Self-pay

## 2021-02-28 ENCOUNTER — Ambulatory Visit (INDEPENDENT_AMBULATORY_CARE_PROVIDER_SITE_OTHER): Payer: 59 | Admitting: Physician Assistant

## 2021-02-28 DIAGNOSIS — L820 Inflamed seborrheic keratosis: Secondary | ICD-10-CM

## 2021-02-28 DIAGNOSIS — Z8614 Personal history of Methicillin resistant Staphylococcus aureus infection: Secondary | ICD-10-CM | POA: Diagnosis not present

## 2021-02-28 DIAGNOSIS — B079 Viral wart, unspecified: Secondary | ICD-10-CM | POA: Diagnosis not present

## 2021-02-28 NOTE — Progress Notes (Signed)
   Follow-Up Visit   Subjective  Greg [name] is a 55 y.o. male who presents for the following: Follow-up (Patient had MRSA on neck and wants to have if rechecked- tx Mupirocin- seems better ).   The following portions of the chart were reviewed this encounter and updated as appropriate:      Objective  Well appearing patient in no apparent distress; mood and affect are within normal limits.  All skin waist up examined.  Left Forearm - Anterior, Left Upper Arm - Anterior Verrucous papules -- Discussed viral etiology and contagion.   Head - Anterior (Face), Left Lower Eyelid, Mid Back, Right Anterior Neck (6), Right Zygomatic Area (2) Stuck-on, waxy papules and plaques.    Assessment & Plan  Viral warts, unspecified type (2) Left Upper Arm - Anterior; Left Forearm - Anterior  Destruction of lesion - Left Forearm - Anterior, Left Upper Arm - Anterior Complexity: simple   Destruction method: cryotherapy   Informed consent: discussed and consent obtained   Timeout:  patient name, date of birth, surgical site, and procedure verified Lesion destroyed using liquid nitrogen: Yes   Cryotherapy cycles:  1 Outcome: patient tolerated procedure well with no complications   Post-procedure details: wound care instructions given    Seborrheic keratosis, inflamed Head - Anterior (Face); Mid Back; Left Lower Eyelid; Right Zygomatic Area (2); Right Anterior Neck (6)  Benign no treatment needed.   Destruction of lesion - Head - Anterior (Face), Left Lower Eyelid, Mid Back, Right Anterior Neck, Right Zygomatic Area Complexity: simple   Destruction method: cryotherapy   Informed consent: discussed and consent obtained   Timeout:  patient name, date of birth, surgical site, and procedure verified Lesion destroyed using liquid nitrogen: Yes   Cryotherapy cycles:  1 Outcome: patient tolerated procedure well with no complications   Post-procedure details: wound care instructions given       I, Kamaree Berkel, PA-C, have reviewed all documentation's for this visit.  The documentation on 02/28/21 for the exam, diagnosis, procedures and orders are all accurate and complete.

## 2021-07-21 IMAGING — CT CT ENTEROGRAPHY (ABD-PELV W/ CM)
1 of 4 series · 13 of 32 positions shown, 18 images · IV contrast (iopamidol)
Comparison: 07/24/2013

CLINICAL DATA: Crohn's disease. Previous bowel resections.

EXAM:
CT ABDOMEN AND PELVIS WITH CONTRAST (ENTEROGRAPHY)
TECHNIQUE: Multidetector CT of the abdomen and pelvis during bolus
administration of intravenous contrast. Negative oral contrast was
given.
CONTRAST:  100mL YWKPVV-V22 IOPAMIDOL (YWKPVV-V22) INJECTION 61%

[Series 3: enterography 3 mm · axial · 0.79mm/px · z∈[-515,-86]mm · 13 of 167 slices shown, 18 images]
[im 12/167  soft-tissue]
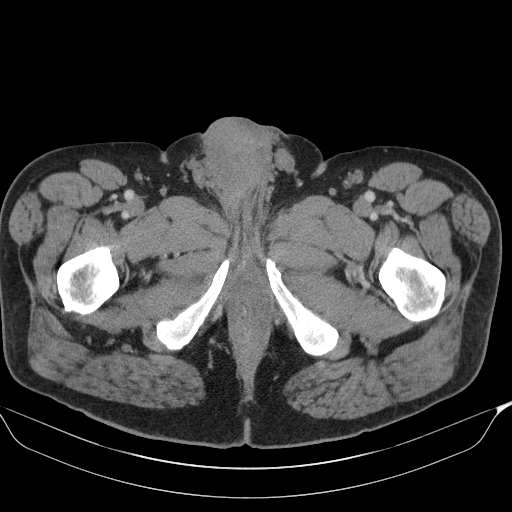
[im 12/167  bone]
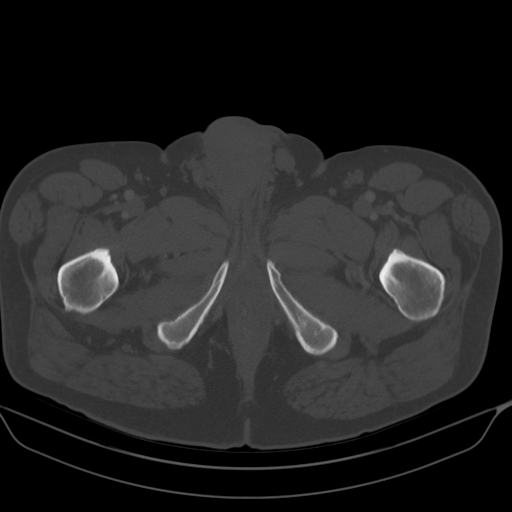
[im 23/167  soft-tissue]
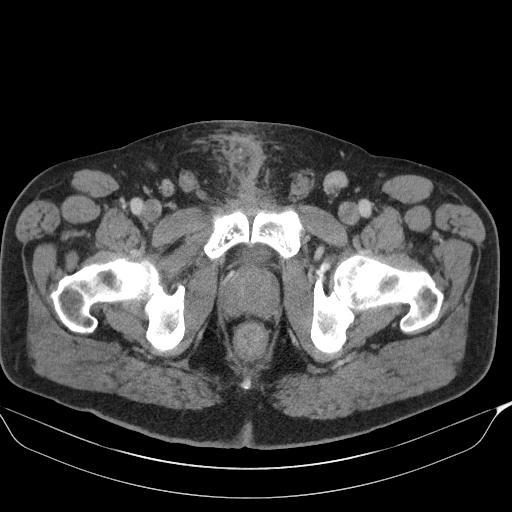
[im 34/167  soft-tissue]
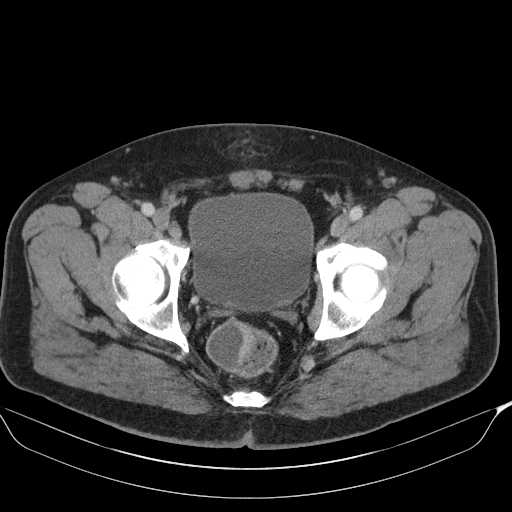
[im 56/167  soft-tissue]
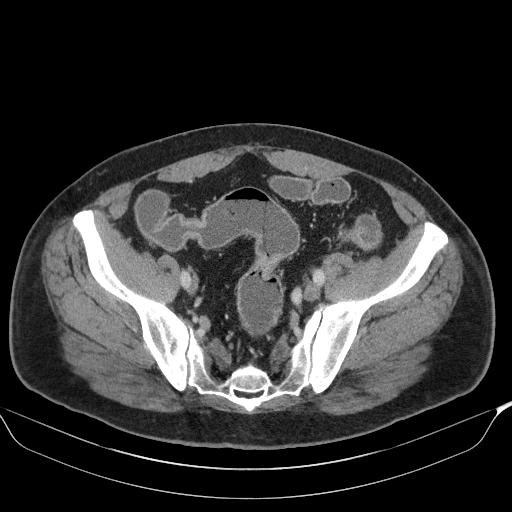
[im 67/167  soft-tissue]
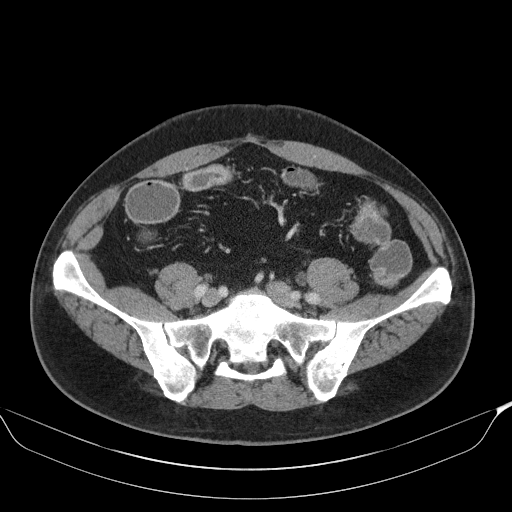
[im 78/167  soft-tissue]
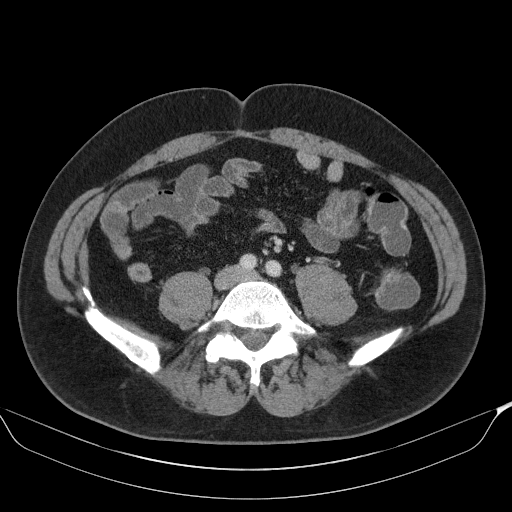
[im 89/167  soft-tissue]
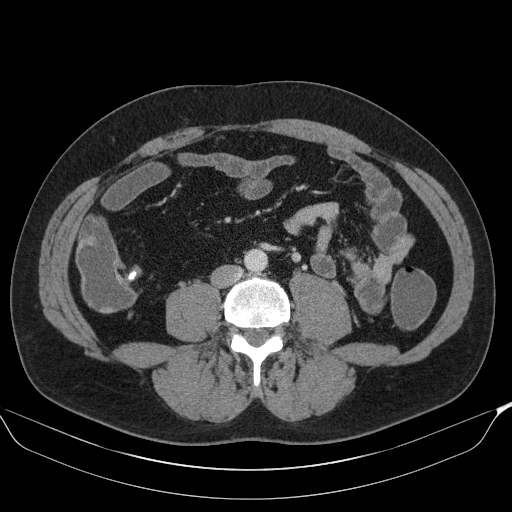
[im 100/167  soft-tissue]
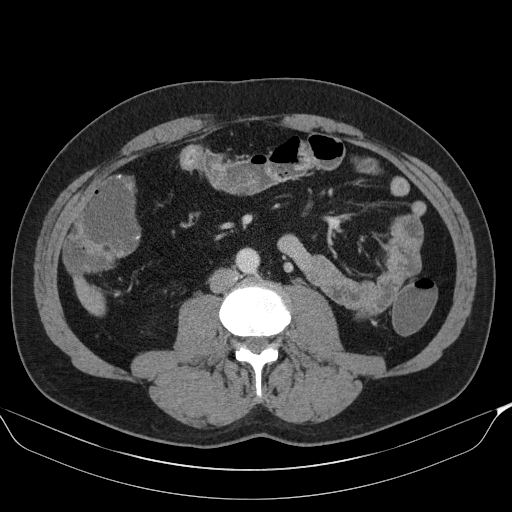
[im 111/167  soft-tissue]
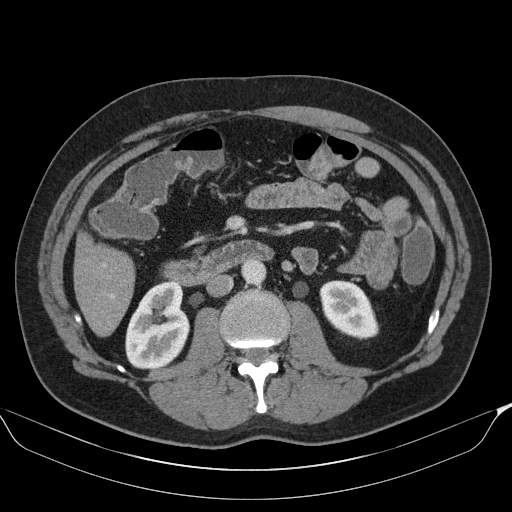
[im 111/167  bone]
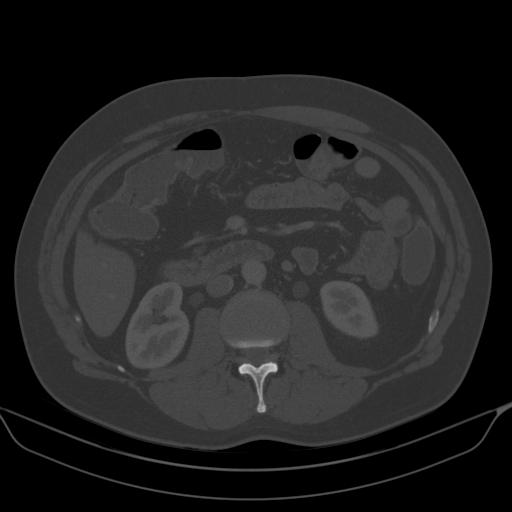
[im 122/167  lung]
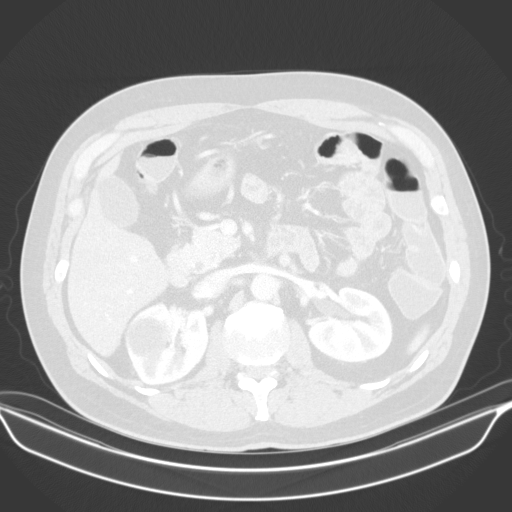
[im 133/167  soft-tissue]
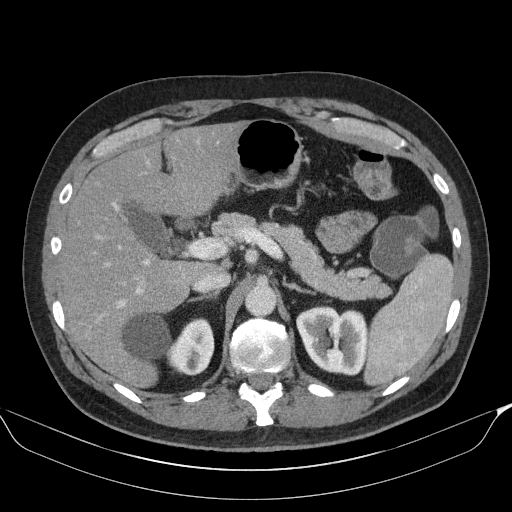
[im 133/167  lung]
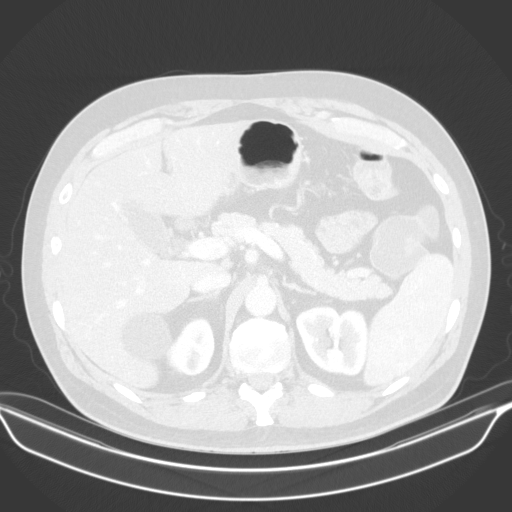
[im 144/167  soft-tissue]
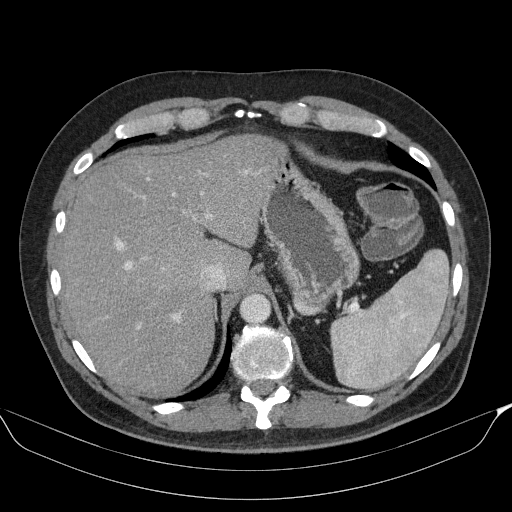
[im 144/167  lung]
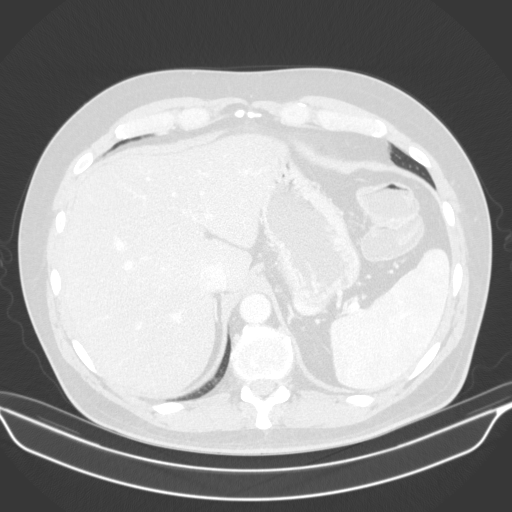
[im 155/167  soft-tissue]
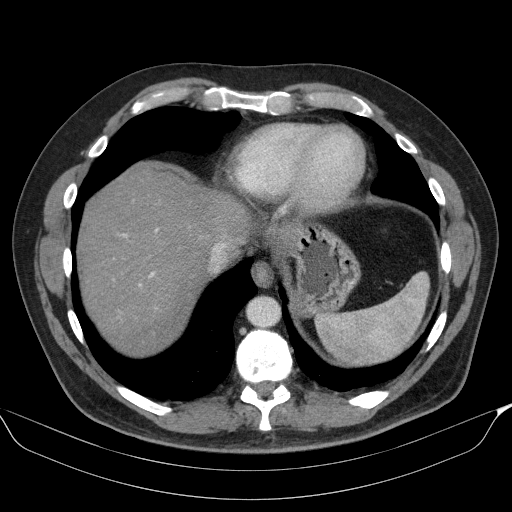
[im 155/167  lung]
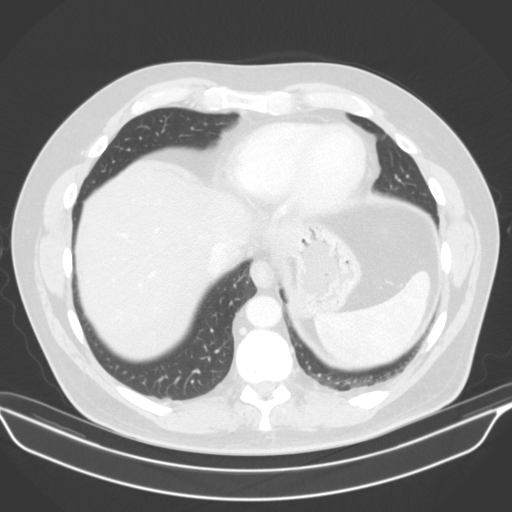

[13 of 32 positions shown; findings below may reference images not displayed]

FINDINGS: Lower Chest: No acute findings.

Hepatobiliary: No hepatic masses identified. Mild diffuse hepatic
steatosis again noted. Gallstones are seen, however there is no
evidence of cholecystitis or biliary dilatation.

Pancreas:  No mass or inflammatory changes.

Spleen: Within normal limits in size and appearance.

Adrenals/Urinary Tract: No masses identified. Stable small bilateral
renal cysts. No evidence of ureteral calculi or hydronephrosis.
Unremarkable unopacified urinary bladder.

Stomach/Bowel: Stable postop changes from distal small bowel
resection. There has been resolution of small bowel obstruction
since previous study. Mild wall thickening and mucosal enhancement
is seen involving a short segment of the distal ileum, just proximal
to the ileocolonic anastomosis. This shows significant improvement
since previous study. Mild wall thickening and mucosal enhancement
is seen in the lower rectum which is new since previous study and
suspicious for proctitis.

Vascular/Lymphatic: No pathologically enlarged lymph nodes. No
abdominal aortic aneurysm.

Reproductive: Normal size prostate gland. Heterogeneously enhancing
ill-defined soft tissue density is seen in subcutaneous tissues just
superior to the base of the penis. This measures 6.7 x 4.9 cm, and
has increased in size compared to 5.0 x 4.0 cm on prior study.
Increased soft tissue stranding also seen in the adjacent
subcutaneous fat.

Other:  None.

Musculoskeletal:  No suspicious bone lesions identified.
IMPRESSION: Significant improvement in enteritis involving a short segment of
distal ileum. Resolution of distal small bowel obstruction since
previous study.

Mild proctitis which is new since previous study. No evidence of
abscess.

Mildly increased size of ill-defined masslike soft tissue density
just superior to the base of the penis, with adjacent soft tissue
stranding. Differential diagnosis includes inflammatory mass and
neoplasm. Recommend correlation with physical exam and laboratory
findings, and consider tissue sampling for pathologic diagnosis.

Cholelithiasis. No radiographic evidence of cholecystitis.

Mild hepatic steatosis.

## 2021-10-18 ENCOUNTER — Emergency Department (HOSPITAL_BASED_OUTPATIENT_CLINIC_OR_DEPARTMENT_OTHER)
Admission: EM | Admit: 2021-10-18 | Discharge: 2021-10-18 | Disposition: A | Discharge status: Home / Self Care | Payer: Commercial Managed Care - HMO | Attending: Emergency Medicine | Admitting: Emergency Medicine

## 2021-10-18 ENCOUNTER — Encounter (HOSPITAL_BASED_OUTPATIENT_CLINIC_OR_DEPARTMENT_OTHER): Payer: Self-pay | Admitting: Urology

## 2021-10-18 ENCOUNTER — Other Ambulatory Visit: Payer: Self-pay

## 2021-10-18 DIAGNOSIS — W57XXXA Bitten or stung by nonvenomous insect and other nonvenomous arthropods, initial encounter: Secondary | ICD-10-CM | POA: Diagnosis not present

## 2021-10-18 DIAGNOSIS — L2489 Irritant contact dermatitis due to other agents: Secondary | ICD-10-CM | POA: Diagnosis not present

## 2021-10-18 DIAGNOSIS — S80861A Insect bite (nonvenomous), right lower leg, initial encounter: Secondary | ICD-10-CM | POA: Diagnosis present

## 2021-10-18 NOTE — ED Triage Notes (Signed)
Possible spider bite Sunday, redness and rash noted to right lower calf  Seen at PCP and told to come in for second look  Had rash to buttocks as well, started on medication for shingles (valcyclovir)  H/o Chron's

## 2021-10-18 NOTE — ED Provider Notes (Signed)
[facility] EMERGENCY DEPARTMENT Provider Note   CSN: [id number] Arrival date & time: 10/18/21  1629     History  Chief Complaint  Patient presents with   Insect Bite   Rash    Greg [name] is a 56 y.o. male with history of IBS and Crohn's who presents the emergency department for possible spider bite.  Patient states that he thinks he was bit on his right lower leg about 5 days ago.  He was seen at his primary doctor, and they told him to get a second opinion and recommended he go to the ER.  He also reports a rash on his left hip that was diagnosed with shingles, and he was started on valacyclovir.   Rash Associated symptoms: no fever        Home Medications Prior to Admission medications   Medication Sig Start Date End Date Taking? Authorizing Provider  Adalimumab (HUMIRA) 40 MG/0.8ML PSKT Inject into the skin.    [provider]  ALPRAZolam Duanne Moron) 0.5 MG tablet Take by mouth. 09/04/18   [provider]  mupirocin ointment (BACTROBAN) 2 % Apply 1 application topically 2 (two) times daily. 12/28/20   Warren Danes, PA-C  predniSONE (DELTASONE) 20 MG tablet Take 1 tablet (20 mg total) by mouth daily with breakfast. 09/21/13   Ginger Organ., MD  traMADol (ULTRAM) 50 MG tablet Take 50 mg by mouth every 6 (six) hours as needed for moderate pain or severe pain. Patient not taking: Reported on 12/28/2020    [provider]  triamcinolone cream (KENALOG) 0.1 % Apply 1 application topically 2 (two) times daily as needed. 12/28/20   Warren Danes, PA-C      Allergies    Patient has no known allergies.    Review of Systems   Review of Systems  Constitutional:  Negative for fever.  Skin:  Positive for rash.  All other systems reviewed and are negative.   Physical Exam Updated Vital Signs BP (!) 154/89 (BP Location: Right Arm)   Pulse 73   Temp 98.4 F (36.9 C) (Oral)   Resp 18   Ht 6' 2"  (1.88 m)   Wt 94 kg   SpO2 99%    BMI 26.61 kg/m  Physical Exam Vitals and nursing note reviewed.  Constitutional:      Appearance: Normal appearance.  HENT:     Head: Normocephalic and atraumatic.  Eyes:     Conjunctiva/sclera: Conjunctivae normal.  Pulmonary:     Effort: Pulmonary effort is normal. No respiratory distress.  Skin:    General: Skin is warm and dry.     Comments: Partially healed maculopapular lesion on the right lower leg, with some mild evidence of excoriation.  No overlying redness or purulence.  Neurological:     Mental Status: He is alert.  Psychiatric:        Mood and Affect: Mood normal.        Behavior: Behavior normal.     ED Results / Procedures / Treatments   Labs (all labs ordered are listed, but only abnormal results are displayed) Labs Reviewed - No data to display  EKG None  Radiology No results found.  Procedures Procedures    Medications Ordered in ED Medications - No data to display  ED Course/ Medical Decision Making/ A&P  Medical Decision Making  This patient is a 56 y.o. male who presents to the ED for concern of insect bite, concern for infection.   Physical Exam: Physical exam performed. The pertinent findings include: Partially healed maculopapular lesion on the right lower leg, with some mild evidence of excoriation, no evidence of cellulitis.    Disposition: After consideration of the diagnostic results and the patients response to treatment, I feel that patient is not requiring admission. Symptoms likely related to insect bite vs contact dermatitis. Low suspicion for cellulitis. Will recommend hydrocortisone cream and follow up with PCP. Discussed reasons to return to the emergency department, and the patient is agreeable to the plan.   Final Clinical Impression(s) / ED Diagnoses Final diagnoses:  Insect bite of right lower leg, initial encounter  Irritant contact dermatitis due to other agents    Rx / DC Orders ED  Discharge Orders     None      Portions of this report may have been transcribed using voice recognition software. Every effort was made to ensure accuracy; however, inadvertent computerized transcription errors may be present.    Kateri Plummer, PA-C 10/18/21 1854    Jeanell Sparrow, DO 10/20/21 0121

## 2021-10-18 NOTE — Discharge Instructions (Addendum)
You were seen in the emergency department for insect bite and rash.  As we discussed, I don't think your bite is infected. I recommend continuing your ointment and adding hydrocortisone cream.

## 2022-02-19 ENCOUNTER — Other Ambulatory Visit (HOSPITAL_BASED_OUTPATIENT_CLINIC_OR_DEPARTMENT_OTHER): Payer: Self-pay

## 2022-02-19 MED ORDER — COMIRNATY 30 MCG/0.3ML IM SUSY
PREFILLED_SYRINGE | INTRAMUSCULAR | 0 refills | Status: DC
  Filled 2022-02-19: qty 0.3, 1d supply, fill #0

## 2022-02-28 ENCOUNTER — Ambulatory Visit: Payer: 59 | Admitting: Physician Assistant

## 2022-04-01 ENCOUNTER — Other Ambulatory Visit (HOSPITAL_BASED_OUTPATIENT_CLINIC_OR_DEPARTMENT_OTHER): Payer: Self-pay

## 2022-04-01 MED ORDER — INFLUENZA VAC SPLIT QUAD 0.5 ML IM SUSY
PREFILLED_SYRINGE | INTRAMUSCULAR | 0 refills | Status: DC
  Filled 2022-04-01: qty 0.5, 1d supply, fill #0

## 2022-10-08 ENCOUNTER — Ambulatory Visit: Payer: Self-pay | Admitting: Surgery

## 2022-10-14 ENCOUNTER — Encounter: Payer: Self-pay | Admitting: Surgery

## 2022-10-14 DIAGNOSIS — M7989 Other specified soft tissue disorders: Secondary | ICD-10-CM

## 2022-10-14 HISTORY — DX: Other specified soft tissue disorders: M79.89

## 2022-10-14 NOTE — H&P (Signed)
REFERRING PHYSICIAN: Sandra Cockayne  PROVIDER: Rayhaan Huster Myra Rude, MD   Chief Complaint: New Consultation ( Enlarging posterior neck lipoma)  History of Present Illness:  Patient is referred by Dr. Sandra Cockayne for surgical evaluation and management of a soft tissue mass on the posterior neck. This was originally noted by the patient's barber a little over 1 year ago. It is been followed by his primary care physician. It is gradually increased in size. Patient presents for consideration for surgical excision. Patient denies any significant pain. He has had no drainage. It is because no neurologic symptoms. Patient has had no previous such lesions removed. He has had no imaging performed.  Review of Systems: A complete review of systems was obtained from the patient. I have reviewed this information and discussed as appropriate with the patient. See HPI as well for other ROS.  Review of Systems Constitutional: Negative. HENT: Negative. Eyes: Negative. Respiratory: Negative. Cardiovascular: Negative. Gastrointestinal: Negative. Genitourinary: Negative. Musculoskeletal: Negative. Skin: Posterior neck mass Neurological: Negative. Endo/Heme/Allergies: Negative. Psychiatric/Behavioral: Negative.   Medical History: Past Medical History: Diagnosis Date Anxiety  Patient Active Problem List Diagnosis Soft tissue mass  Past Surgical History: Procedure Laterality Date 2 resections   No Known Allergies  Current Outpatient Medications on File Prior to Visit Medication Sig Dispense Refill ALPRAZolam (XANAX) 0.5 MG tablet TK 1 T PO Q 12 H PRA HUMIRA,CF, PEN 40 mg/0.4 mL pen injector kit  No current facility-administered medications on file prior to visit.  History reviewed. No pertinent family history.  Social History  Tobacco Use Smoking Status Never Smokeless Tobacco Never   Social History  Socioeconomic History Marital status:  Single Tobacco Use Smoking status: Never Smokeless tobacco: Never Substance and Sexual Activity Alcohol use: Not Currently Drug use: Never  Objective:  Vitals: BP: (!) 151/95 Pulse: 81 Temp: 36.7 C (98 F) SpO2: 98% Weight: (!) 105.7 kg (233 lb) Height: 185.4 cm (6\' 1" )  Body mass index is 30.74 kg/m.  Physical Exam  GENERAL APPEARANCE Comfortable, no acute issues Development: normal Gross deformities: none  SKIN Rash, lesions, ulcers: none Induration, erythema: none Nodules: none palpable  EYES Conjunctiva and lids: normal Pupils: equal and reactive  EARS, NOSE, MOUTH, THROAT External ears: no lesion or deformity External nose: no lesion or deformity Hearing: grossly normal  NECK Symmetric: yes Trachea: midline Thyroid: no palpable nodules in the thyroid bed On the posterior neck in the midline there is a soft tissue mass which is visible. On palpation this measures approximately 3 cm x 3 cm x 2 cm in size and is somewhat firm, discrete, and mobile. It is nontender. There is no associated lymphadenopathy.  ABDOMEN Not assessed  GENITOURINARY/RECTAL Not assessed  MUSCULOSKELETAL Station and gait: normal Digits and nails: no clubbing or cyanosis Muscle strength: grossly normal all extremities Range of motion: grossly normal all extremities Deformity: none  LYMPHATIC Cervical: none palpable Supraclavicular: none palpable  PSYCHIATRIC Oriented to person, place, and time: yes Mood and affect: normal for situation Judgment and insight: appropriate for situation  Assessment and Plan:  Soft tissue mass of posterior neck  Patient presents on referral from his primary care physician, Dr. Eric Form, for surgical evaluation and management of an enlarging soft tissue mass on the posterior neck.  Today we discussed excising this as an outpatient surgical procedure under anesthesia. This can be done at the [facility] surgery center. We discussed the  size and location of the surgical incision. We will plan  to send this to pathology for evaluation. Patient understands and agrees to proceed.  Darnell Level, MD [facility] practice Office: [phone]

## 2022-10-15 NOTE — Patient Instructions (Signed)
DUE TO COVID-19 ONLY TWO VISITORS  (aged 59 and older)  ARE ALLOWED TO COME WITH YOU AND STAY IN THE WAITING ROOM ONLY DURING PRE OP AND PROCEDURE.   **NO VISITORS ARE ALLOWED IN THE SHORT STAY AREA OR RECOVERY ROOM!!**  IF YOU WILL BE ADMITTED INTO THE HOSPITAL YOU ARE ALLOWED ONLY FOUR SUPPORT PEOPLE DURING VISITATION HOURS ONLY (7 AM -8PM)   The support person(s) must pass our screening, gel in and out, and wear a mask at all times, including in the patient's room. Patients must also wear a mask when staff or their support person are in the room. Visitors GUEST BADGE MUST BE WORN VISIBLY  One adult visitor may remain with you overnight and MUST be in the room by 8 P.M.     Your procedure is scheduled on: 10/22/22   Report to [facility] Main Entrance    Report to admitting at : 7:45 AM   Call this number if you have problems the morning of surgery [phone]   Do not eat food :After Midnight.   After Midnight you may have the following liquids until : 7:00 AM DAY OF SURGERY  Water Black Coffee (sugar ok, NO MILK/CREAM OR CREAMERS)  Tea (sugar ok, NO MILK/CREAM OR CREAMERS) regular and decaf                             Plain Jell-O (NO RED)                                           Fruit ices (not with fruit pulp, NO RED)                                     Popsicles (NO RED)                                                                  Juice: apple, WHITE grape, WHITE cranberry Sports drinks like Gatorade (NO RED)          Oral Hygiene is also important to reduce your risk of infection.                                    Remember - BRUSH YOUR TEETH THE MORNING OF SURGERY WITH YOUR REGULAR TOOTHPASTE  DENTURES WILL BE REMOVED PRIOR TO SURGERY PLEASE DO NOT APPLY "Poly grip" OR ADHESIVES!!!   Do NOT smoke after Midnight   Take these medicines the morning of surgery with A SIP OF WATER: alprazolam as needed.                              You may not have any metal  on your body including hair pins, jewelry, and body piercing             Do not wear lotions, powders, perfumes/cologne, or deodorant  Men may shave face and neck.   Do not bring valuables to the hospital. [place].   Contacts, glasses, or bridgework may not be worn into surgery.   Bring small overnight bag day of surgery.   DO NOT [place]. PHARMACY WILL DISPENSE MEDICATIONS LISTED ON YOUR MEDICATION LIST TO YOU DURING YOUR ADMISSION [place]!    Patients discharged on the day of surgery will not be allowed to drive home.  Someone NEEDS to stay with you for the first 24 hours after anesthesia.   Special Instructions: Bring a copy of your healthcare power of attorney and living will documents         the day of surgery if you haven't scanned them before.              Please read over the following fact sheets you were given: IF YOU HAVE QUESTIONS ABOUT YOUR PRE-OP INSTRUCTIONS PLEASE CALL [phone]    [facility] - Preparing for Surgery Before surgery, you can play an important role.  Because skin is not sterile, your skin needs to be as free of germs as possible.  You can reduce the number of germs on your skin by washing with CHG (chlorahexidine gluconate) soap before surgery.  CHG is an antiseptic cleaner which kills germs and bonds with the skin to continue killing germs even after washing. Please DO NOT use if you have an allergy to CHG or antibacterial soaps.  If your skin becomes reddened/irritated stop using the CHG and inform your nurse when you arrive at Short Stay. Do not shave (including legs and underarms) for at least 48 hours prior to the first CHG shower.  You may shave your face/neck. Please follow these instructions carefully:  1.  Shower with CHG Soap the night before surgery and the  morning of Surgery.  2.  If you choose to wash your hair, wash your hair first as  usual with your  normal  shampoo.  3.  After you shampoo, rinse your hair and body thoroughly to remove the  shampoo.                           4.  Use CHG as you would any other liquid soap.  You can apply chg directly  to the skin and wash                       Gently with a scrungie or clean washcloth.  5.  Apply the CHG Soap to your body ONLY FROM THE NECK DOWN.   Do not use on face/ open                           Wound or open sores. Avoid contact with eyes, ears mouth and genitals (private parts).                       Wash face,  Genitals (private parts) with your normal soap.             6.  Wash thoroughly, paying special attention to the area where your surgery  will be performed.  7.  Thoroughly rinse your body with warm water from the neck down.  8.  DO  NOT shower/wash with your normal soap after using and rinsing off  the CHG Soap.                9.  Pat yourself dry with a clean towel.            10.  Wear clean pajamas.            11.  Place clean sheets on your bed the night of your first shower and do not  sleep with pets. Day of Surgery : Do not apply any lotions/deodorants the morning of surgery.  Please wear clean clothes to the hospital/surgery center.  FAILURE TO FOLLOW THESE INSTRUCTIONS MAY RESULT IN THE CANCELLATION OF YOUR SURGERY PATIENT SIGNATURE_________________________________  NURSE SIGNATURE__________________________________  ________________________________________________________________________

## 2022-10-16 ENCOUNTER — Encounter (HOSPITAL_COMMUNITY)
Admission: RE | Admit: 2022-10-16 | Discharge: 2022-10-16 | Disposition: A | Discharge status: Home / Self Care | Payer: Commercial Managed Care - HMO | Source: Ambulatory Visit | Attending: Surgery | Admitting: Surgery

## 2022-10-16 ENCOUNTER — Other Ambulatory Visit: Payer: Self-pay

## 2022-10-16 ENCOUNTER — Encounter (HOSPITAL_COMMUNITY): Payer: Self-pay

## 2022-10-16 VITALS — BP 148/98 | HR 65 | Temp 98.1°F | Ht 73.0 in | Wt 229.0 lb

## 2022-10-16 DIAGNOSIS — Z01812 Encounter for preprocedural laboratory examination: Secondary | ICD-10-CM | POA: Diagnosis present

## 2022-10-16 DIAGNOSIS — Z01818 Encounter for other preprocedural examination: Secondary | ICD-10-CM

## 2022-10-16 LAB — CBC
HCT: 47.7 % (ref 39.0–52.0)
Hemoglobin: 16.1 g/dL (ref 13.0–17.0)
MCH: 29.4 pg (ref 26.0–34.0)
MCHC: 33.8 g/dL (ref 30.0–36.0)
MCV: 87 fL (ref 80.0–100.0)
Platelets: 177 10*3/uL (ref 150–400)
RBC: 5.48 MIL/uL (ref 4.22–5.81)
RDW: 12.6 % (ref 11.5–15.5)
WBC: 5.1 10*3/uL (ref 4.0–10.5)
nRBC: 0 % (ref 0.0–0.2)

## 2022-10-16 NOTE — Progress Notes (Signed)
For Short Stay: COVID SWAB appointment date:  Bowel Prep reminder:   For Anesthesia: PCP - Dr. Cleatis Polka. Cardiologist - N/A  Chest x-ray -  EKG -  Stress Test -  ECHO -  Cardiac Cath -  Pacemaker/ICD device last checked: Pacemaker orders received: Device Rep notified:  Spinal Cord Stimulator: N/A  Sleep Study - N/A CPAP -   Fasting Blood Sugar - N/A Checks Blood Sugar _____ times a day Date and result of last Hgb A1c-  Last dose of GLP1 agonist- N/A GLP1 instructions:   Last dose of SGLT-2 inhibitors- N/A SGLT-2 instructions:   Blood Thinner Instructions: N/A Aspirin Instructions: Last Dose:  Activity level: Can go up a flight of stairs and activities of daily living without stopping and without chest pain and/or shortness of breath   Able to exercise without chest pain and/or shortness of breath  Anesthesia review:   Patient denies shortness of breath, fever, cough and chest pain at PAT appointment   Patient verbalized understanding of instructions that were given to them at the PAT appointment. Patient was also instructed that they will need to review over the PAT instructions again at home before surgery.

## 2022-10-22 ENCOUNTER — Ambulatory Visit (HOSPITAL_COMMUNITY): Payer: Commercial Managed Care - HMO | Admitting: Registered Nurse

## 2022-10-22 ENCOUNTER — Other Ambulatory Visit: Payer: Self-pay

## 2022-10-22 ENCOUNTER — Encounter (HOSPITAL_COMMUNITY)
Admission: RE | Disposition: A | Discharge status: Home / Self Care | Payer: Self-pay | Source: Ambulatory Visit | Attending: Surgery

## 2022-10-22 ENCOUNTER — Ambulatory Visit (HOSPITAL_BASED_OUTPATIENT_CLINIC_OR_DEPARTMENT_OTHER): Payer: Commercial Managed Care - HMO | Admitting: Registered Nurse

## 2022-10-22 ENCOUNTER — Encounter (HOSPITAL_COMMUNITY): Payer: Self-pay | Admitting: Surgery

## 2022-10-22 ENCOUNTER — Ambulatory Visit (HOSPITAL_COMMUNITY)
Admission: RE | Admit: 2022-10-22 | Discharge: 2022-10-22 | Disposition: A | Discharge status: Home / Self Care | Payer: Commercial Managed Care - HMO | Source: Ambulatory Visit | Attending: Surgery | Admitting: Surgery

## 2022-10-22 DIAGNOSIS — R221 Localized swelling, mass and lump, neck: Secondary | ICD-10-CM | POA: Diagnosis not present

## 2022-10-22 DIAGNOSIS — D649 Anemia, unspecified: Secondary | ICD-10-CM | POA: Diagnosis not present

## 2022-10-22 DIAGNOSIS — F32A Depression, unspecified: Secondary | ICD-10-CM | POA: Diagnosis not present

## 2022-10-22 DIAGNOSIS — G709 Myoneural disorder, unspecified: Secondary | ICD-10-CM | POA: Insufficient documentation

## 2022-10-22 DIAGNOSIS — K5090 Crohn's disease, unspecified, without complications: Secondary | ICD-10-CM | POA: Insufficient documentation

## 2022-10-22 DIAGNOSIS — F419 Anxiety disorder, unspecified: Secondary | ICD-10-CM | POA: Diagnosis not present

## 2022-10-22 DIAGNOSIS — D170 Benign lipomatous neoplasm of skin and subcutaneous tissue of head, face and neck: Secondary | ICD-10-CM | POA: Diagnosis not present

## 2022-10-22 DIAGNOSIS — M7989 Other specified soft tissue disorders: Secondary | ICD-10-CM | POA: Diagnosis present

## 2022-10-22 HISTORY — PX: MASS EXCISION: SHX2000

## 2022-10-22 SURGERY — EXCISION MASS
Anesthesia: General

## 2022-10-22 MED ORDER — DEXAMETHASONE SODIUM PHOSPHATE 10 MG/ML IJ SOLN
INTRAMUSCULAR | Status: DC | PRN
  Administered 2022-10-22: 8 mg via INTRAVENOUS

## 2022-10-22 MED ORDER — LABETALOL HCL 5 MG/ML IV SOLN
5.0000 mg | Freq: Once | INTRAVENOUS | Status: AC
  Administered 2022-10-22: 5 mg via INTRAVENOUS

## 2022-10-22 MED ORDER — CHLORHEXIDINE GLUCONATE CLOTH 2 % EX PADS: 6.0000 | MEDICATED_PAD | Freq: Once | CUTANEOUS | Status: DC

## 2022-10-22 MED ORDER — FENTANYL CITRATE (PF) 100 MCG/2ML IJ SOLN
INTRAMUSCULAR | Status: DC | PRN
  Administered 2022-10-22 (×2): 50 ug via INTRAVENOUS

## 2022-10-22 MED ORDER — LIDOCAINE 2% (20 MG/ML) 5 ML SYRINGE
INTRAMUSCULAR | Status: DC | PRN
  Administered 2022-10-22: 60 mg via INTRAVENOUS

## 2022-10-22 MED ORDER — AMISULPRIDE (ANTIEMETIC) 5 MG/2ML IV SOLN: 10.0000 mg | Freq: Once | INTRAVENOUS | Status: DC | PRN

## 2022-10-22 MED ORDER — MIDAZOLAM HCL 5 MG/5ML IJ SOLN
INTRAMUSCULAR | Status: DC | PRN
  Administered 2022-10-22: 2 mg via INTRAVENOUS

## 2022-10-22 MED ORDER — LABETALOL HCL 5 MG/ML IV SOLN
INTRAVENOUS | Status: AC
  Filled 2022-10-22: qty 4

## 2022-10-22 MED ORDER — ACETAMINOPHEN 500 MG PO TABS
1000.0000 mg | ORAL_TABLET | Freq: Once | ORAL | Status: AC
  Administered 2022-10-22: 1000 mg via ORAL
  Filled 2022-10-22: qty 2

## 2022-10-22 MED ORDER — FENTANYL CITRATE PF 50 MCG/ML IJ SOSY
PREFILLED_SYRINGE | INTRAMUSCULAR | Status: AC
  Administered 2022-10-22: 50 ug via INTRAVENOUS
  Filled 2022-10-22: qty 2

## 2022-10-22 MED ORDER — MIDAZOLAM HCL 2 MG/2ML IJ SOLN
INTRAMUSCULAR | Status: AC
  Filled 2022-10-22: qty 2

## 2022-10-22 MED ORDER — LACTATED RINGERS IV SOLN: INTRAVENOUS | Status: DC

## 2022-10-22 MED ORDER — PROPOFOL 10 MG/ML IV BOLUS
INTRAVENOUS | Status: AC
  Filled 2022-10-22: qty 20

## 2022-10-22 MED ORDER — FENTANYL CITRATE PF 50 MCG/ML IJ SOSY
25.0000 ug | PREFILLED_SYRINGE | INTRAMUSCULAR | Status: DC | PRN
  Administered 2022-10-22: 50 ug via INTRAVENOUS

## 2022-10-22 MED ORDER — BUPIVACAINE HCL (PF) 0.5 % IJ SOLN
INTRAMUSCULAR | Status: AC
  Filled 2022-10-22: qty 30

## 2022-10-22 MED ORDER — PROPOFOL 10 MG/ML IV BOLUS
INTRAVENOUS | Status: DC | PRN
  Administered 2022-10-22: 200 mg via INTRAVENOUS

## 2022-10-22 MED ORDER — BUPIVACAINE HCL 0.5 % IJ SOLN
INTRAMUSCULAR | Status: DC | PRN
  Administered 2022-10-22: 10 mL

## 2022-10-22 MED ORDER — FENTANYL CITRATE (PF) 100 MCG/2ML IJ SOLN
INTRAMUSCULAR | Status: AC
  Filled 2022-10-22: qty 2

## 2022-10-22 MED ORDER — DEXAMETHASONE SODIUM PHOSPHATE 10 MG/ML IJ SOLN
INTRAMUSCULAR | Status: AC
  Filled 2022-10-22: qty 1

## 2022-10-22 MED ORDER — LIDOCAINE HCL 1 % IJ SOLN
INTRAMUSCULAR | Status: AC
  Filled 2022-10-22: qty 40

## 2022-10-22 MED ORDER — LIDOCAINE HCL (PF) 2 % IJ SOLN
INTRAMUSCULAR | Status: AC
  Filled 2022-10-22: qty 5

## 2022-10-22 MED ORDER — CHLORHEXIDINE GLUCONATE 0.12 % MT SOLN
15.0000 mL | Freq: Once | OROMUCOSAL | Status: AC
  Administered 2022-10-22: 15 mL via OROMUCOSAL

## 2022-10-22 MED ORDER — CEFAZOLIN SODIUM-DEXTROSE 2-4 GM/100ML-% IV SOLN
2.0000 g | INTRAVENOUS | Status: AC
  Administered 2022-10-22: 2 g via INTRAVENOUS
  Filled 2022-10-22: qty 100

## 2022-10-22 MED ORDER — ORAL CARE MOUTH RINSE: 15.0000 mL | Freq: Once | OROMUCOSAL | Status: AC

## 2022-10-22 MED ORDER — ONDANSETRON HCL 4 MG/2ML IJ SOLN
INTRAMUSCULAR | Status: AC
  Filled 2022-10-22: qty 2

## 2022-10-22 MED ORDER — TRAMADOL HCL 50 MG PO TABS: 50.0000 mg | ORAL_TABLET | Freq: Four times a day (QID) | ORAL | 0 refills | Status: DC | PRN

## 2022-10-22 MED ORDER — ONDANSETRON HCL 4 MG/2ML IJ SOLN
INTRAMUSCULAR | Status: DC | PRN
  Administered 2022-10-22: 4 mg via INTRAVENOUS

## 2022-10-22 SURGICAL SUPPLY — 34 items
ADH SKN CLS APL DERMABOND .7 (GAUZE/BANDAGES/DRESSINGS) ×1
APL PRP STRL LF DISP 70% ISPRP (MISCELLANEOUS) ×1
BAG COUNTER SPONGE SURGICOUNT (BAG) IMPLANT
BAG SPNG CNTER NS LX DISP (BAG)
CHLORAPREP W/TINT 26 (MISCELLANEOUS) ×1 IMPLANT
COVER SURGICAL LIGHT HANDLE (MISCELLANEOUS) ×1 IMPLANT
DERMABOND ADVANCED .7 DNX12 (GAUZE/BANDAGES/DRESSINGS) IMPLANT
DRAPE LAPAROSCOPIC ABDOMINAL (DRAPES) IMPLANT
DRAPE LAPAROTOMY T 102X78X121 (DRAPES) IMPLANT
DRAPE LAPAROTOMY TRNSV 102X78 (DRAPES) IMPLANT
DRAPE SHEET LG 3/4 BI-LAMINATE (DRAPES) IMPLANT
ELECT REM PT RETURN 15FT ADLT (MISCELLANEOUS) ×1 IMPLANT
GAUZE SPONGE 4X4 12PLY STRL (GAUZE/BANDAGES/DRESSINGS) IMPLANT
GLOVE BIOGEL PI IND STRL 7.0 (GLOVE) ×1 IMPLANT
GLOVE SURG ORTHO 8.0 STRL STRW (GLOVE) ×1 IMPLANT
GLOVE SURG SYN 7.5 E (GLOVE) ×1 IMPLANT
GLOVE SURG SYN 7.5 PF PI (GLOVE) ×1 IMPLANT
GOWN STRL REUS W/ TWL XL LVL3 (GOWN DISPOSABLE) ×2 IMPLANT
GOWN STRL REUS W/TWL XL LVL3 (GOWN DISPOSABLE) ×2
KIT BASIN OR (CUSTOM PROCEDURE TRAY) ×1 IMPLANT
KIT TURNOVER KIT A (KITS) IMPLANT
MARKER SKIN DUAL TIP RULER LAB (MISCELLANEOUS) IMPLANT
NDL HYPO 25X1 1.5 SAFETY (NEEDLE) ×1 IMPLANT
NEEDLE HYPO 25X1 1.5 SAFETY (NEEDLE) ×1 IMPLANT
NS IRRIG 1000ML POUR BTL (IV SOLUTION) ×1 IMPLANT
PACK GENERAL/GYN (CUSTOM PROCEDURE TRAY) ×1 IMPLANT
SPIKE FLUID TRANSFER (MISCELLANEOUS) IMPLANT
SPONGE T-LAP 4X18 ~~LOC~~+RFID (SPONGE) IMPLANT
STAPLER VISISTAT 35W (STAPLE) IMPLANT
STRIP CLOSURE SKIN 1/2X4 (GAUZE/BANDAGES/DRESSINGS) IMPLANT
SUT MNCRL AB 4-0 PS2 18 (SUTURE) IMPLANT
SUT VIC AB 3-0 SH 18 (SUTURE) IMPLANT
SYR CONTROL 10ML LL (SYRINGE) ×1 IMPLANT
TOWEL OR 17X26 10 PK STRL BLUE (TOWEL DISPOSABLE) ×1 IMPLANT

## 2022-10-22 NOTE — Anesthesia Procedure Notes (Signed)
Procedure Name: LMA Insertion Date/Time: 10/22/2022 9:28 AM  Performed by: Elisabeth Cara, CRNAPre-anesthesia Checklist: Patient identified, Emergency Drugs available, Suction available, Patient being monitored and Timeout performed Patient Re-evaluated:Patient Re-evaluated prior to induction Oxygen Delivery Method: Circle system utilized Preoxygenation: Pre-oxygenation with 100% oxygen Induction Type: IV induction LMA: LMA with gastric port inserted LMA Size: 4.0 Number of attempts: 1 Placement Confirmation: positive ETCO2 and breath sounds checked- equal and bilateral Tube secured with: Tape Dental Injury: Teeth and Oropharynx as per pre-operative assessment

## 2022-10-22 NOTE — Interval H&P Note (Signed)
History and Physical Interval Note:  10/22/2022 8:53 AM  Greg [name]  has presented today for surgery, with the diagnosis of POSTERIOR NECK SOFT TISSUE MASS.  The various methods of treatment have been discussed with the patient and family. After consideration of risks, benefits and other options for treatment, the patient has consented to    Procedure(s) with comments: EXCISION POSTERIOR NECK MASS (N/A) - GEN AND LMA as a surgical intervention.    The patient's history has been reviewed, patient examined, no change in status, stable for surgery.  I have reviewed the patient's chart and labs.  Questions were answered to the patient's satisfaction.    Darnell Level, MD [facility] practice Office: [phone]   Darnell Level

## 2022-10-22 NOTE — Anesthesia Preprocedure Evaluation (Signed)
Anesthesia Evaluation  Patient identified by MRN, date of birth, ID band Patient awake    Reviewed: Allergy & Precautions, NPO status , Patient's Chart, lab work & pertinent test results  Airway Mallampati: II  TM Distance: >3 FB Neck ROM: Full    Dental  (+) Dental Advisory Given   Pulmonary neg pulmonary ROS   breath sounds clear to auscultation       Cardiovascular negative cardio ROS  Rhythm:Regular Rate:Normal     Neuro/Psych  Neuromuscular disease    GI/Hepatic Neg liver ROS,,,Crohns dz   Endo/Other  negative endocrine ROS    Renal/GU negative Renal ROS     Musculoskeletal   Abdominal   Peds  Hematology  (+) Blood dyscrasia, anemia   Anesthesia Other Findings   Reproductive/Obstetrics                             Anesthesia Physical Anesthesia Plan  ASA: 2  Anesthesia Plan: General   Post-op Pain Management: Tylenol PO (pre-op)* and Toradol IV (intra-op)*   Induction: Intravenous  PONV Risk Score and Plan: 2 and Dexamethasone, Ondansetron, Treatment may vary due to age or medical condition and Midazolam  Airway Management Planned: LMA  Additional Equipment:   Intra-op Plan:   Post-operative Plan: Extubation in OR  Informed Consent: I have reviewed the patients History and Physical, chart, labs and discussed the procedure including the risks, benefits and alternatives for the proposed anesthesia with the patient or authorized representative who has indicated his/her understanding and acceptance.     Dental advisory given  Plan Discussed with: CRNA  Anesthesia Plan Comments:        Anesthesia Quick Evaluation

## 2022-10-22 NOTE — Discharge Instructions (Addendum)
  [facility] -- DISCHARGE INSTRUCTIONS  REMINDER:   Carry a list of your medications and allergies with you at all times  Call your pharmacy at least 1 week in advance to refill prescriptions  Do not mix any prescribed pain medicine with alcohol  Do not drive any motor vehicles while taking pain medication  Take medications with food unless otherwise directed  Follow-up appointments (date to return to physician): Please call [phone] to confirm your follow up appointment with your surgeon.  Call your Surgeon if you have:  Temperature greater than 101.0  Persistent nausea and vomiting  Severe uncontrolled pain  Redness, tenderness, or signs of infection (pain, swelling, redness, odor or green/yellow discharge around the site)  Difficulty breathing, headache or visual disturbances  Hives  Persistent dizziness or light-headedness  Any other questions or concerns you may have after discharge  In an emergency, call 911 or go to an Emergency Department at a nearby hospital.  Diet: Begin with liquids, and if they are tolerated, resume your usual diet.  Avoid spicy, greasy or heavy foods.  If you have nausea or vomiting, go back to liquids.  If you cannot keep liquids down, call your doctor.  Avoid alcohol consumption while on prescription pain medications. Good nutrition promotes healing. Increase fiber and fluids.   ADDITIONAL INSTRUCTIONS: Leave Dermabond in place for 7 to 10 days.  May shower at any time.  Ice pack to wound for first 2 to 3 days and then as needed.  [facility] Office: [phone]

## 2022-10-22 NOTE — Anesthesia Postprocedure Evaluation (Signed)
Anesthesia Post Note  Patient: Greg [name]  Procedure(s) Performed: EXCISION POSTERIOR NECK MASS     Patient location during evaluation: PACU Anesthesia Type: General Level of consciousness: awake and alert Pain management: pain level controlled Vital Signs Assessment: post-procedure vital signs reviewed and stable Respiratory status: spontaneous breathing, nonlabored ventilation, respiratory function stable and patient connected to nasal cannula oxygen Cardiovascular status: blood pressure returned to baseline and stable Postop Assessment: no apparent nausea or vomiting Anesthetic complications: no  No notable events documented.  Last Vitals:  Vitals:   10/22/22 1147 10/22/22 1155  BP:  (!) 143/102  Pulse: 61 62  Resp:    Temp:    SpO2: 96% 96%    Last Pain:  Vitals:   10/22/22 1155  TempSrc:   PainSc: 3                  Kennieth Rad

## 2022-10-22 NOTE — Transfer of Care (Signed)
Immediate Anesthesia Transfer of Care Note  Patient: Korey Lenoir  Procedure(s) Performed: EXCISION POSTERIOR NECK MASS  Patient Location: PACU  Anesthesia Type:General  Level of Consciousness: awake, alert , oriented, and patient cooperative  Airway & Oxygen Therapy: Patient Spontanous Breathing and Patient connected to face mask oxygen  Post-op Assessment: Report given to RN, Post -op Vital signs reviewed and stable, and Patient moving all extremities  Post vital signs: Reviewed and stable  Last Vitals:  Vitals Value Taken Time  BP 156/108 10/22/22 1025  Temp    Pulse 71 10/22/22 1028  Resp 13 10/22/22 1028  SpO2 100 % 10/22/22 1028  Vitals shown include unvalidated device data.  Last Pain:  Vitals:   10/22/22 0732  TempSrc: Oral  PainSc: 0-No pain         Complications: No notable events documented.

## 2022-10-22 NOTE — Op Note (Signed)
Operative Note  Pre-operative Diagnosis: Soft tissue mass, left posterior neck  Post-operative Diagnosis: Same  Surgeon:  Darnell Level, MD  Assistant: None   Procedure: Excision soft tissue mass left posterior neck, 3.5 x 3.0 x 2.5 cm, subcutaneous  Anesthesia: General, LMA  Estimated Blood Loss: Minimal  Drains: None         Specimen: Soft tissue mass to pathology  Indications:  Patient is referred by Dr. Sandra Cockayne for surgical evaluation and management of a soft tissue mass on the posterior neck. This was originally noted by the patient's barber a little over 1 year ago. It is been followed by his primary care physician. It is gradually increased in size. Patient presents for consideration for surgical excision. Patient denies any significant pain. He has had no drainage. It is because no neurologic symptoms. Patient has had no previous such lesions removed.  Patient now comes to surgery for excision for definitive diagnosis and management.  Procedure:  The patient was seen in the pre-op holding area. The risks, benefits, complications, treatment options, and expected outcomes were previously discussed with the patient. The patient agreed with the proposed plan and has signed the informed consent form.  The patient was brought to the operating room by the surgical team, identified as Greg [name] and the procedure verified. A "time out" was completed and the above information confirmed.  Following administration of general anesthesia, the patient is turned to the right lateral decubitus position on the beanbag and properly positioned and secured.  After ascertaining that an adequate level of anesthesia been achieved, the patient was prepped and draped in usual aseptic fashion.  Using a #15 blade an incision was made transversely over the palpable soft tissue mass in the left posterior neck.  Using electrocautery for hemostasis, dissection was carried into the subcutaneous  tissues.  The mass was localized.  The mass was then excised circumferentially with a small border of normal adipose tissue around the mass.  The mass did extend down to the underlying musculature.  It was completely excised.  It measured 3.5 x 3.0 x 2.5 cm.  It was submitted to pathology for review.  Wound was inspected for hemostasis.  Wound was irrigated with warm saline.  Subcutaneous tissues were closed with interrupted 3-0 Vicryl sutures.  Skin edges were anesthetized with local anesthetic.  Skin edges were reapproximated with a running 4-0 Monocryl subcuticular suture.  Wound was washed and dried and Dermabond was applied as dressing.  Patient was awakened from anesthesia and transported to the recovery room in stable condition.  The patient tolerated the procedure well.   Darnell Level, MD [facility] Office: [phone]

## 2022-10-23 ENCOUNTER — Encounter (HOSPITAL_COMMUNITY): Payer: Self-pay | Admitting: Surgery

## 2022-10-23 LAB — SURGICAL PATHOLOGY

## 2022-10-24 NOTE — Progress Notes (Signed)
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  tmg  Darnell Level, MD [facility] practice Office: [phone]

## 2022-10-25 NOTE — Progress Notes (Signed)
     Once again a pathology issue.  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  Darnell Level, MD [facility] practice Office: [phone]

## 2022-11-20 DIAGNOSIS — Z9889 Other specified postprocedural states: Secondary | ICD-10-CM | POA: Insufficient documentation

## 2023-12-12 ENCOUNTER — Encounter (HOSPITAL_BASED_OUTPATIENT_CLINIC_OR_DEPARTMENT_OTHER): Payer: Self-pay

## 2023-12-12 ENCOUNTER — Other Ambulatory Visit: Payer: Self-pay

## 2023-12-12 ENCOUNTER — Emergency Department (HOSPITAL_BASED_OUTPATIENT_CLINIC_OR_DEPARTMENT_OTHER)

## 2023-12-12 ENCOUNTER — Emergency Department (HOSPITAL_BASED_OUTPATIENT_CLINIC_OR_DEPARTMENT_OTHER)
Admission: EM | Admit: 2023-12-12 | Discharge: 2023-12-13 | Disposition: A | Discharge status: Home / Self Care | Source: Home / Self Care | Attending: Emergency Medicine | Admitting: Emergency Medicine

## 2023-12-12 DIAGNOSIS — R1033 Periumbilical pain: Secondary | ICD-10-CM

## 2023-12-12 DIAGNOSIS — D72829 Elevated white blood cell count, unspecified: Secondary | ICD-10-CM | POA: Insufficient documentation

## 2023-12-12 DIAGNOSIS — M545 Low back pain, unspecified: Secondary | ICD-10-CM | POA: Insufficient documentation

## 2023-12-12 DIAGNOSIS — R1084 Generalized abdominal pain: Secondary | ICD-10-CM | POA: Insufficient documentation

## 2023-12-12 DIAGNOSIS — I10 Essential (primary) hypertension: Secondary | ICD-10-CM | POA: Insufficient documentation

## 2023-12-12 DIAGNOSIS — K8000 Calculus of gallbladder with acute cholecystitis without obstruction: Secondary | ICD-10-CM | POA: Diagnosis not present

## 2023-12-12 DIAGNOSIS — K810 Acute cholecystitis: Secondary | ICD-10-CM | POA: Diagnosis not present

## 2023-12-12 LAB — COMPREHENSIVE METABOLIC PANEL WITH GFR
ALT: 43 U/L (ref 0–44)
AST: 46 U/L — ABNORMAL HIGH (ref 15–41)
Albumin: 4.6 g/dL (ref 3.5–5.0)
Alkaline Phosphatase: 84 U/L (ref 38–126)
Anion gap: 16 — ABNORMAL HIGH (ref 5–15)
BUN: 12 mg/dL (ref 6–20)
CO2: 22 mmol/L (ref 22–32)
Calcium: 10.1 mg/dL (ref 8.9–10.3)
Chloride: 95 mmol/L — ABNORMAL LOW (ref 98–111)
Creatinine, Ser: 1.23 mg/dL (ref 0.61–1.24)
GFR, Estimated: >60 mL/min (ref >60)
Glucose, Bld: 152 mg/dL — ABNORMAL HIGH (ref 70–99)
Potassium: 3.6 mmol/L (ref 3.5–5.1)
Sodium: 133 mmol/L — ABNORMAL LOW (ref 135–145)
Total Bilirubin: 0.9 mg/dL (ref 0.0–1.2)
Total Protein: 9.1 g/dL — ABNORMAL HIGH (ref 6.5–8.1)

## 2023-12-12 LAB — CBC
HCT: 49.8 % (ref 39.0–52.0)
Hemoglobin: 17.3 g/dL — ABNORMAL HIGH (ref 13.0–17.0)
MCH: 29.1 pg (ref 26.0–34.0)
MCHC: 34.7 g/dL (ref 30.0–36.0)
MCV: 83.7 fL (ref 80.0–100.0)
Platelets: 217 K/uL (ref 150–400)
RBC: 5.95 MIL/uL — ABNORMAL HIGH (ref 4.22–5.81)
RDW: 12.4 % (ref 11.5–15.5)
WBC: 11.3 K/uL — ABNORMAL HIGH (ref 4.0–10.5)
nRBC: 0 % (ref 0.0–0.2)

## 2023-12-12 LAB — LIPASE, BLOOD: Lipase: 29 U/L (ref 11–51)

## 2023-12-12 LAB — URINALYSIS, ROUTINE W REFLEX MICROSCOPIC
Bacteria, UA: NONE SEEN
Bilirubin Urine: NEGATIVE
Glucose, UA: NEGATIVE mg/dL
Ketones, ur: NEGATIVE mg/dL
Leukocytes,Ua: NEGATIVE
Nitrite: NEGATIVE
Specific Gravity, Urine: 1.033 — ABNORMAL HIGH (ref 1.005–1.030)
pH: 7 (ref 5.0–8.0)

## 2023-12-12 MED ORDER — SODIUM CHLORIDE 0.9 % IV BOLUS
1000.0000 mL | Freq: Once | INTRAVENOUS | Status: AC
  Administered 2023-12-12: 1000 mL via INTRAVENOUS

## 2023-12-12 MED ORDER — ONDANSETRON HCL 4 MG/2ML IJ SOLN
4.0000 mg | Freq: Once | INTRAMUSCULAR | Status: AC
  Administered 2023-12-12: 4 mg via INTRAVENOUS
  Filled 2023-12-12: qty 2

## 2023-12-12 MED ORDER — DICYCLOMINE HCL 20 MG PO TABS: 20.0000 mg | ORAL_TABLET | Freq: Two times a day (BID) | ORAL | 0 refills | Status: DC

## 2023-12-12 MED ORDER — HYDROMORPHONE HCL 1 MG/ML IJ SOLN
1.0000 mg | Freq: Once | INTRAMUSCULAR | Status: AC
  Administered 2023-12-12: 1 mg via INTRAVENOUS
  Filled 2023-12-12: qty 1

## 2023-12-12 MED ORDER — MORPHINE SULFATE (PF) 4 MG/ML IV SOLN
4.0000 mg | Freq: Once | INTRAVENOUS | Status: AC
  Administered 2023-12-12: 4 mg via INTRAVENOUS
  Filled 2023-12-12: qty 1

## 2023-12-12 MED ORDER — OXYCODONE HCL 5 MG PO TABS: 5.0000 mg | ORAL_TABLET | ORAL | 0 refills | Status: AC | PRN

## 2023-12-12 MED ORDER — ONDANSETRON 4 MG PO TBDP: 4.0000 mg | ORAL_TABLET | Freq: Three times a day (TID) | ORAL | 0 refills | Status: DC | PRN

## 2023-12-12 MED ORDER — IOHEXOL 350 MG/ML SOLN
125.0000 mL | Freq: Once | INTRAVENOUS | Status: AC | PRN
  Administered 2023-12-12: 125 mL via INTRAVENOUS

## 2023-12-12 MED ORDER — HYDROMORPHONE HCL 1 MG/ML IJ SOLN
0.5000 mg | Freq: Once | INTRAMUSCULAR | Status: AC
  Administered 2023-12-12: 0.5 mg via INTRAVENOUS
  Filled 2023-12-12: qty 1

## 2023-12-12 NOTE — ED Triage Notes (Signed)
 Pt states that he severe abdominal pain that radiates to back started earlier today. Pt has Crohns and states that this feels similar to when he has had blockages in the past. Pt denies nausea, vomiting, diarrhea/constipation. Denies dysuria. :ast BM was this morning

## 2023-12-12 NOTE — ED Notes (Signed)
 Fluids paused and pt transported to CT.  Reports that pain medication did not work at all.  Pt continues to moan and groan.

## 2023-12-12 NOTE — ED Provider Notes (Signed)
 [place] EMERGENCY DEPARTMENT AT [facility] Provider Note   CSN: [id number] Arrival date & time: 12/12/23  2020     Patient presents with: Abdominal Pain   Greg [name] is a 58 y.o. male hx of crohns, SBO, fistula here for evaluation of mid periumbilical abdominal pain going into his back.  States earlier today he developed severe pain that goes from his back into his abdomen.  He has a history of Crohn's, abdominal pain does feel similar to when he has had prior obstructions however he typically does not have back pain with this.  No history of AAA or dissections.  No fever, nausea, vomiting, diarrhea or constipation.  Had a bowel movement this morning without any difficulty, no bloody stool.  No dysuria or hematuria.  No history of kidney stones.  No chest pain, shortness of breath, lower extremity edema.  No chronic NSAID use, EtOH use.   HPI     Prior to Admission medications   Medication Sig Start Date End Date Taking? Authorizing Provider  dicyclomine  (BENTYL ) 20 MG tablet Take 1 tablet (20 mg total) by mouth 2 (two) times daily. 12/12/23  Yes Anielle Headrick A, PA-C  ondansetron  (ZOFRAN -ODT) 4 MG disintegrating tablet Take 1 tablet (4 mg total) by mouth every 8 (eight) hours as needed. 12/12/23  Yes Taishawn Smaldone A, PA-C  oxyCODONE  (ROXICODONE ) 5 MG immediate release tablet Take 1 tablet (5 mg total) by mouth every 4 (four) hours as needed for severe pain (pain score 7-10). 12/12/23  Yes Jezreel Sisk A, PA-C  Adalimumab  (HUMIRA ) 40 MG/0.8ML PSKT Inject 40 mg into the skin every 14 (fourteen) days.    [provider]  ALPRAZolam  (XANAX ) 0.5 MG tablet Take 0.5 mg by mouth daily as needed for anxiety. 09/04/18   [provider]  COVID-19 mRNA vaccine [phone] (COMIRNATY ) syringe Inject into the muscle. 02/19/22   Luiz Channel, MD  influenza vac split quadrivalent PF (FLUARIX) 0.5 ML injection Inject into the muscle. 04/01/22   Luiz Channel, MD   traMADol  (ULTRAM ) 50 MG tablet Take 1 tablet (50 mg total) by mouth every 6 (six) hours as needed for moderate pain. 10/22/22   Eletha Boas, MD    Allergies: Patient has no known allergies.    Review of Systems  Constitutional:  Positive for diaphoresis.  HENT: Negative.    Respiratory: Negative.    Cardiovascular: Negative.   Gastrointestinal:  Positive for abdominal pain. Negative for abdominal distention, blood in stool, constipation, diarrhea, nausea, rectal pain and vomiting.  Genitourinary: Negative.   Musculoskeletal:  Positive for back pain.  Skin: Negative.   Neurological: Negative.   All other systems reviewed and are negative.   Updated Vital Signs BP (!) 180/115   Pulse 79   Temp 97.7 F (36.5 C) (Oral)   Resp 19   SpO2 98%   Physical Exam Vitals and nursing note reviewed.  Constitutional:      General: He is not in acute distress.    Appearance: He is well-developed. He is not ill-appearing, toxic-appearing or diaphoretic.     Comments: Appears uncomfortable  HENT:     Head: Normocephalic and atraumatic.  Eyes:     Pupils: Pupils are equal, round, and reactive to light.  Cardiovascular:     Rate and Rhythm: Normal rate and regular rhythm.     Pulses: Normal pulses.          Radial pulses are 2+ on the right side and 2+ on the left  side.       Dorsalis pedis pulses are 2+ on the right side and 2+ on the left side.     Heart sounds: Normal heart sounds.  Pulmonary:     Effort: Pulmonary effort is normal. No respiratory distress.     Breath sounds: Normal breath sounds.  Abdominal:     General: Bowel sounds are normal. There is no distension.     Palpations: Abdomen is soft.     Tenderness: There is generalized abdominal tenderness.      Comments: Generalized tenderness, no rebound or guarding    Musculoskeletal:        General: Normal range of motion.     Cervical back: Normal range of motion and neck supple.       Back:     Comments: No bony  tenderness, compartments soft, full range of motion  Skin:    General: Skin is warm and dry.     Capillary Refill: Capillary refill takes less than 2 seconds.     Comments: No obvious rashes or lesions on exposed  Neurological:     General: No focal deficit present.     Mental Status: He is alert and oriented to person, place, and time.     Cranial Nerves: Cranial nerves 2-12 are intact.     Sensory: Sensation is intact.     Motor: Motor function is intact.     Gait: Gait is intact.     (all labs ordered are listed, but only abnormal results are displayed) Labs Reviewed  COMPREHENSIVE METABOLIC PANEL WITH GFR - Abnormal; Notable for the following components:      Result Value   Sodium 133 (*)    Chloride 95 (*)    Glucose, Bld 152 (*)    Total Protein 9.1 (*)    AST 46 (*)    Anion gap 16 (*)    All other components within normal limits  CBC - Abnormal; Notable for the following components:   WBC 11.3 (*)    RBC 5.95 (*)    Hemoglobin 17.3 (*)    All other components within normal limits  URINALYSIS, ROUTINE W REFLEX MICROSCOPIC - Abnormal; Notable for the following components:   Color, Urine COLORLESS (*)    Specific Gravity, Urine 1.033 (*)    Hgb urine dipstick TRACE (*)    Protein, ur TRACE (*)    All other components within normal limits  LIPASE, BLOOD    EKG: None  Radiology: CT Angio Chest/Abd/Pel for Dissection W and/or Wo Contrast Result Date: 12/12/2023 CLINICAL DATA:  Aortic aneurysm suspected. Crohn's disease and abdominal pain. EXAM: CT ANGIOGRAPHY CHEST, ABDOMEN AND PELVIS TECHNIQUE: Noncontrast CT chest was performed. Multidetector CT imaging through the chest, abdomen and pelvis was performed using the standard protocol during bolus administration of intravenous contrast. Multiplanar reconstructed images and MIPs were obtained and reviewed to evaluate the vascular anatomy. RADIATION DOSE REDUCTION: This exam was performed according to the departmental  dose-optimization program which includes automated exposure control, adjustment of the mA and/or kV according to patient size and/or use of iterative reconstruction technique. CONTRAST:  OMNIPAQUE  IOHEXOL  350 MG/ML SOLN COMPARISON:  CT abdomen and pelvis 02/10/2019 FINDINGS: CTA CHEST FINDINGS Cardiovascular: Preferential opacification of the thoracic aorta. No evidence of thoracic aortic aneurysm or dissection. Normal heart size. No pericardial effusion. Mediastinum/Nodes: No enlarged lymph nodes are seen. Visualized thyroid gland is within normal limits. There is diffuse distal esophageal wall thickening. Lungs/Pleura: There are small  bands of atelectasis in the left lower lobe and lingula. The lungs are otherwise clear. There is no pleural effusion or pneumothorax. Musculoskeletal: No chest wall abnormality. No acute or significant osseous findings. Review of the MIP images confirms the above findings. CTA ABDOMEN AND PELVIS FINDINGS VASCULAR Aorta: Normal caliber aorta without aneurysm, dissection, vasculitis or significant stenosis. Celiac: Patent without evidence of aneurysm, dissection, vasculitis or significant stenosis. SMA: Patent without evidence of aneurysm, dissection, vasculitis or significant stenosis. Renals: Both renal arteries are patent without evidence of aneurysm, dissection, vasculitis, fibromuscular dysplasia or significant stenosis. IMA: Patent without evidence of aneurysm, dissection, vasculitis or significant stenosis. Inflow: Patent without evidence of aneurysm, dissection, vasculitis or significant stenosis. Veins: No obvious venous abnormality within the limitations of this arterial phase study. Review of the MIP images confirms the above findings. NON-VASCULAR Hepatobiliary: No focal liver abnormality is seen. No gallstones, gallbladder wall thickening, or biliary dilatation. Pancreas: Unremarkable. No pancreatic ductal dilatation or surrounding inflammatory changes. Spleen:  Normal in size without focal abnormality. Adrenals/Urinary Tract: There is a 5.7 cm cyst in the right kidney. Additional smaller cyst is seen in the superior pole the right kidney. There is a 4.1 cm cyst in the left kidney. There is no hydronephrosis or perinephric fat stranding. The adrenal glands and bladder are within normal limits. Stomach/Bowel: Stomach is within normal limits. Appendix is not visualized. No evidence of bowel wall thickening, distention, or inflammatory changes. Lymphatic: There are mildly enlarged bilateral inguinal lymph nodes measuring up to 13 mm. No other enlarged abdominal or pelvic lymph nodes are seen. Reproductive: Prostate is unremarkable. Other: There is no ascites or free air. There is no focal abdominal wall hernia. There is some infiltration and hyperdensity in the fat anterior and inferior to the pubic symphysis extending to the level of the penis similar to prior. No soft tissue gas. No definitive fluid collection identified. Musculoskeletal: No acute fractures are seen. Review of the MIP images confirms the above findings. IMPRESSION: 1. No evidence for aortic dissection or aneurysm. 2. Diffuse distal esophageal wall thickening may represent esophagitis. 3. Infiltration and hyperdensity in the fat anterior and inferior to the pubic symphysis extending to the level of the penis similar to prior. No soft tissue gas or definitive fluid collection identified. Findings may be related to chronic inflammation, hematoma, infection or other soft tissue abnormality. Correlate clinically. 4. Mildly enlarged bilateral inguinal lymph nodes, likely reactive. 5. Bilateral renal cysts. No follow-up imaging necessary. Electronically Signed   By: Greig Pique M.D.   On: 12/12/2023 21:57     Procedures   Medications Ordered in the ED  morphine  (PF) 4 MG/ML injection 4 mg (4 mg Intravenous Given 12/12/23 2104)  ondansetron  (ZOFRAN ) injection 4 mg (4 mg Intravenous Given 12/12/23 2105)   sodium chloride  0.9 % bolus 1,000 mL (0 mLs Intravenous Stopped 12/12/23 2324)  iohexol  (OMNIPAQUE ) 350 MG/ML injection 125 mL (125 mLs Intravenous Contrast Given 12/12/23 2132)  HYDROmorphone  (DILAUDID ) injection 1 mg (1 mg Intravenous Given 12/12/23 2149)  HYDROmorphone  (DILAUDID ) injection 0.5 mg (0.5 mg Intravenous Given 12/12/23 4147)    58 year old here for evaluation abdominal pain and back pain.  He is afebrile however does appear to be in pain. Neurovascularly intact.  No chest pain, shortness of breath.  No history of AAA or dissection.  Will plan on pain management CT imaging and reassess  Labs and imaging personally viewed and interpreted:  CBC leukocytosis 11 point Metabolic panel sodium 133, glucose 152, AST 46  similar to prior Lipase 29 UA negative for infection CTA possible esophagitis, anterior fat 2 pubic symphysis similar to prior  Patient reassessed.  Still having some pain.  Will treat with additional pain  Patient reassessed.  Long discussion with patient, family in room.  Reassuring CT scan and labs.  He is requesting additional pain medicine.  I discussed admission however he would like to hold off at this time  Patient reassessed.  He states his pain is improved.  He is tolerating p.o. intake.  Patient states he really thought I had an obstruction.  His pain has improved however he did require 3 doses of IV pain medicine.  I again recommended admission for further workup and management.  Patient DECLINES admission and further workup at this time.  We discussed risk versus benefit to include permanent organ damage, death.  Voices understanding however declines further workup and admission at this time.  We did discuss his elevated blood pressure here.  Could certainly be due to pain.  He wants to go home.  Will have him follow-up outpatient, return for any worsening symptoms.  Considered atypical ACS, PE however no chest pain, shortness of breath, lower extremity edema.                                    Medical Decision Making Amount and/or Complexity of Data Reviewed Independent Historian: friend External Data Reviewed: labs, radiology and notes. Labs: ordered. Decision-making details documented in ED Course. Radiology: ordered and independent interpretation performed. Decision-making details documented in ED Course.  Risk OTC drugs. Prescription drug management. Parenteral controlled substances. Decision regarding hospitalization. Diagnosis or treatment significantly limited by social determinants of health.       Final diagnoses:  Periumbilical abdominal pain  Hypertension, unspecified type    ED Discharge Orders          Ordered    oxyCODONE  (ROXICODONE ) 5 MG immediate release tablet  Every 4 hours PRN        12/12/23 2340    ondansetron  (ZOFRAN -ODT) 4 MG disintegrating tablet  Every 8 hours PRN        12/12/23 2340    dicyclomine  (BENTYL ) 20 MG tablet  2 times daily        12/12/23 2340               Jya Hughston A, PA-C 12/12/23 2345    Dasie Faden, MD 12/15/23 [phone]

## 2023-12-12 NOTE — Discharge Instructions (Signed)
 It was a pleasure taking care of you here today as we discussed your labs and CT scan were reassuring  We discussed admission for pain control which you declined.  I have written you for a short course of pain medicine.  Oxycodone -opioid prescription for pain take every 4-6 hours Zofran -medication for nausea  Bentyl -abdominal spasm medication  Liquid diet over the next 24 hours, gradually increase to normal diet  Return for new or follow-up with your gastroenterology

## 2023-12-14 ENCOUNTER — Other Ambulatory Visit: Payer: Self-pay

## 2023-12-14 ENCOUNTER — Emergency Department (HOSPITAL_COMMUNITY)

## 2023-12-14 ENCOUNTER — Encounter (HOSPITAL_COMMUNITY): Payer: Self-pay

## 2023-12-14 ENCOUNTER — Inpatient Hospital Stay (HOSPITAL_COMMUNITY)
Admission: EM | Admit: 2023-12-14 | Discharge: 2023-12-23 | DRG: 418 | Disposition: A | Discharge status: Home / Self Care | Attending: Internal Medicine | Admitting: Internal Medicine

## 2023-12-14 DIAGNOSIS — Z886 Allergy status to analgesic agent status: Secondary | ICD-10-CM | POA: Diagnosis not present

## 2023-12-14 DIAGNOSIS — K810 Acute cholecystitis: Secondary | ICD-10-CM | POA: Diagnosis present

## 2023-12-14 DIAGNOSIS — N179 Acute kidney failure, unspecified: Secondary | ICD-10-CM | POA: Diagnosis present

## 2023-12-14 DIAGNOSIS — G4700 Insomnia, unspecified: Secondary | ICD-10-CM | POA: Diagnosis present

## 2023-12-14 DIAGNOSIS — M791 Myalgia, unspecified site: Secondary | ICD-10-CM | POA: Insufficient documentation

## 2023-12-14 DIAGNOSIS — K9189 Other postprocedural complications and disorders of digestive system: Secondary | ICD-10-CM | POA: Diagnosis present

## 2023-12-14 DIAGNOSIS — F411 Generalized anxiety disorder: Secondary | ICD-10-CM | POA: Diagnosis present

## 2023-12-14 DIAGNOSIS — K8000 Calculus of gallbladder with acute cholecystitis without obstruction: Principal | ICD-10-CM | POA: Diagnosis present

## 2023-12-14 DIAGNOSIS — Z79899 Other long term (current) drug therapy: Secondary | ICD-10-CM | POA: Diagnosis not present

## 2023-12-14 DIAGNOSIS — Y838 Other surgical procedures as the cause of abnormal reaction of the patient, or of later complication, without mention of misadventure at the time of the procedure: Secondary | ICD-10-CM | POA: Diagnosis present

## 2023-12-14 DIAGNOSIS — Z79620 Long term (current) use of immunosuppressive biologic: Secondary | ICD-10-CM

## 2023-12-14 DIAGNOSIS — E871 Hypo-osmolality and hyponatremia: Secondary | ICD-10-CM | POA: Diagnosis present

## 2023-12-14 DIAGNOSIS — K760 Fatty (change of) liver, not elsewhere classified: Secondary | ICD-10-CM | POA: Diagnosis present

## 2023-12-14 DIAGNOSIS — K219 Gastro-esophageal reflux disease without esophagitis: Secondary | ICD-10-CM | POA: Diagnosis present

## 2023-12-14 DIAGNOSIS — J90 Pleural effusion, not elsewhere classified: Secondary | ICD-10-CM | POA: Diagnosis not present

## 2023-12-14 DIAGNOSIS — I872 Venous insufficiency (chronic) (peripheral): Secondary | ICD-10-CM | POA: Diagnosis present

## 2023-12-14 DIAGNOSIS — E559 Vitamin D deficiency, unspecified: Secondary | ICD-10-CM | POA: Insufficient documentation

## 2023-12-14 DIAGNOSIS — D84821 Immunodeficiency due to drugs: Secondary | ICD-10-CM | POA: Diagnosis present

## 2023-12-14 DIAGNOSIS — Z825 Family history of asthma and other chronic lower respiratory diseases: Secondary | ICD-10-CM

## 2023-12-14 DIAGNOSIS — I10 Essential (primary) hypertension: Secondary | ICD-10-CM | POA: Diagnosis present

## 2023-12-14 DIAGNOSIS — K565 Intestinal adhesions [bands], unspecified as to partial versus complete obstruction: Secondary | ICD-10-CM | POA: Diagnosis present

## 2023-12-14 DIAGNOSIS — R7989 Other specified abnormal findings of blood chemistry: Secondary | ICD-10-CM | POA: Diagnosis present

## 2023-12-14 DIAGNOSIS — J982 Interstitial emphysema: Secondary | ICD-10-CM | POA: Diagnosis not present

## 2023-12-14 DIAGNOSIS — K82A1 Gangrene of gallbladder in cholecystitis: Secondary | ICD-10-CM | POA: Diagnosis present

## 2023-12-14 DIAGNOSIS — K5000 Crohn's disease of small intestine without complications: Secondary | ICD-10-CM | POA: Diagnosis present

## 2023-12-14 DIAGNOSIS — K567 Ileus, unspecified: Secondary | ICD-10-CM | POA: Diagnosis not present

## 2023-12-14 DIAGNOSIS — E860 Dehydration: Secondary | ICD-10-CM | POA: Diagnosis present

## 2023-12-14 DIAGNOSIS — K50819 Crohn's disease of both small and large intestine with unspecified complications: Secondary | ICD-10-CM | POA: Diagnosis not present

## 2023-12-14 DIAGNOSIS — K5090 Crohn's disease, unspecified, without complications: Secondary | ICD-10-CM | POA: Diagnosis present

## 2023-12-14 DIAGNOSIS — Z801 Family history of malignant neoplasm of trachea, bronchus and lung: Secondary | ICD-10-CM | POA: Diagnosis not present

## 2023-12-14 DIAGNOSIS — E781 Pure hyperglyceridemia: Secondary | ICD-10-CM | POA: Insufficient documentation

## 2023-12-14 DIAGNOSIS — J9811 Atelectasis: Secondary | ICD-10-CM | POA: Diagnosis not present

## 2023-12-14 DIAGNOSIS — E538 Deficiency of other specified B group vitamins: Secondary | ICD-10-CM | POA: Insufficient documentation

## 2023-12-14 LAB — URINALYSIS, ROUTINE W REFLEX MICROSCOPIC
Bacteria, UA: NONE SEEN
Bilirubin Urine: NEGATIVE
Glucose, UA: NEGATIVE mg/dL
Ketones, ur: NEGATIVE mg/dL
Leukocytes,Ua: NEGATIVE
Nitrite: NEGATIVE
Protein, ur: 30 mg/dL — AB
Specific Gravity, Urine: 1.02 (ref 1.005–1.030)
pH: 7 (ref 5.0–8.0)

## 2023-12-14 LAB — CBC
HCT: 49.2 % (ref 39.0–52.0)
Hemoglobin: 16.3 g/dL (ref 13.0–17.0)
MCH: 28.1 pg (ref 26.0–34.0)
MCHC: 33.1 g/dL (ref 30.0–36.0)
MCV: 84.7 fL (ref 80.0–100.0)
Platelets: 204 K/uL (ref 150–400)
RBC: 5.81 MIL/uL (ref 4.22–5.81)
RDW: 12.9 % (ref 11.5–15.5)
WBC: 12.8 K/uL — ABNORMAL HIGH (ref 4.0–10.5)
nRBC: 0 % (ref 0.0–0.2)

## 2023-12-14 LAB — COMPREHENSIVE METABOLIC PANEL WITH GFR
ALT: 25 U/L (ref 0–44)
AST: 26 U/L (ref 15–41)
Albumin: 3.6 g/dL (ref 3.5–5.0)
Alkaline Phosphatase: 60 U/L (ref 38–126)
Anion gap: 10 (ref 5–15)
BUN: 12 mg/dL (ref 6–20)
CO2: 21 mmol/L — ABNORMAL LOW (ref 22–32)
Calcium: 8.7 mg/dL — ABNORMAL LOW (ref 8.9–10.3)
Chloride: 94 mmol/L — ABNORMAL LOW (ref 98–111)
Creatinine, Ser: 1.17 mg/dL (ref 0.61–1.24)
GFR, Estimated: >60 mL/min (ref >60)
Glucose, Bld: 146 mg/dL — ABNORMAL HIGH (ref 70–99)
Potassium: 3.7 mmol/L (ref 3.5–5.1)
Sodium: 125 mmol/L — ABNORMAL LOW (ref 135–145)
Total Bilirubin: 1.8 mg/dL — ABNORMAL HIGH (ref 0.0–1.2)
Total Protein: 8.4 g/dL — ABNORMAL HIGH (ref 6.5–8.1)

## 2023-12-14 LAB — LIPASE, BLOOD: Lipase: 29 U/L (ref 11–51)

## 2023-12-14 MED ORDER — HYDROMORPHONE HCL 1 MG/ML IJ SOLN
0.5000 mg | Freq: Once | INTRAMUSCULAR | Status: AC
  Administered 2023-12-14: 0.5 mg via INTRAVENOUS
  Filled 2023-12-14: qty 1

## 2023-12-14 MED ORDER — HEPARIN SODIUM (PORCINE) 5000 UNIT/ML IJ SOLN
5000.0000 [IU] | Freq: Three times a day (TID) | INTRAMUSCULAR | Status: DC
  Administered 2023-12-14: 5000 [IU] via SUBCUTANEOUS
  Filled 2023-12-14 (×2): qty 1

## 2023-12-14 MED ORDER — OXYCODONE HCL 5 MG PO TABS
2.5000 mg | ORAL_TABLET | Freq: Four times a day (QID) | ORAL | Status: DC | PRN
  Administered 2023-12-14 – 2023-12-18 (×16): 5 mg via ORAL
  Filled 2023-12-14 (×9): qty 1

## 2023-12-14 MED ORDER — ALPRAZOLAM 0.5 MG PO TABS
0.5000 mg | ORAL_TABLET | Freq: Every day | ORAL | Status: DC | PRN
  Filled 2023-12-14: qty 1

## 2023-12-14 MED ORDER — HEPARIN SODIUM (PORCINE) 5000 UNIT/ML IJ SOLN
5000.0000 [IU] | Freq: Three times a day (TID) | INTRAMUSCULAR | Status: DC
  Administered 2023-12-15 (×2): 5000 [IU] via SUBCUTANEOUS
  Filled 2023-12-14: qty 1

## 2023-12-14 MED ORDER — HYDROMORPHONE HCL 1 MG/ML IJ SOLN
0.5000 mg | Freq: Three times a day (TID) | INTRAMUSCULAR | Status: DC | PRN
  Administered 2023-12-14 – 2023-12-19 (×8): 0.5 mg via INTRAVENOUS
  Filled 2023-12-14 (×6): qty 0.5

## 2023-12-14 MED ORDER — ADALIMUMAB 40 MG/0.8ML ~~LOC~~ PSKT: 40.0000 mg | PREFILLED_SYRINGE | SUBCUTANEOUS | Status: DC

## 2023-12-14 MED ORDER — ACETAMINOPHEN 500 MG PO TABS: 1000.0000 mg | ORAL_TABLET | ORAL | Status: AC

## 2023-12-14 MED ORDER — CARMEX CLASSIC LIP BALM EX OINT
TOPICAL_OINTMENT | CUTANEOUS | Status: DC | PRN
  Administered 2023-12-14: 1 via TOPICAL
  Filled 2023-12-14: qty 10

## 2023-12-14 MED ORDER — METRONIDAZOLE 500 MG/100ML IV SOLN
500.0000 mg | Freq: Once | INTRAVENOUS | Status: AC
  Administered 2023-12-14: 500 mg via INTRAVENOUS
  Filled 2023-12-14: qty 100

## 2023-12-14 MED ORDER — LACTATED RINGERS IV SOLN: INTRAVENOUS | Status: AC

## 2023-12-14 MED ORDER — SODIUM CHLORIDE 0.9 % IV BOLUS
1000.0000 mL | Freq: Once | INTRAVENOUS | Status: AC
Start: 2023-12-14 — End: 2023-12-14
  Administered 2023-12-14: 1000 mL via INTRAVENOUS

## 2023-12-14 MED ORDER — BUPIVACAINE LIPOSOME 1.3 % IJ SUSP: 20.0000 mL | Freq: Once | INTRAMUSCULAR | Status: DC

## 2023-12-14 MED ORDER — PANTOPRAZOLE SODIUM 40 MG PO TBEC
40.0000 mg | DELAYED_RELEASE_TABLET | Freq: Two times a day (BID) | ORAL | Status: DC
  Administered 2023-12-14 – 2023-12-17 (×11): 40 mg via ORAL
  Filled 2023-12-14 (×6): qty 1

## 2023-12-14 MED ORDER — CHLORHEXIDINE GLUCONATE CLOTH 2 % EX PADS
6.0000 | MEDICATED_PAD | Freq: Once | CUTANEOUS | Status: AC
  Administered 2023-12-14: 6 via TOPICAL

## 2023-12-14 MED ORDER — HYDROMORPHONE HCL 1 MG/ML IJ SOLN: 0.5000 mg | INTRAMUSCULAR | Status: DC | PRN

## 2023-12-14 MED ORDER — SODIUM CHLORIDE 0.9 % IV SOLN
2.0000 g | Freq: Once | INTRAVENOUS | Status: AC
  Administered 2023-12-14: 2 g via INTRAVENOUS
  Filled 2023-12-14: qty 20

## 2023-12-14 MED ORDER — GABAPENTIN 100 MG PO CAPS: 300.0000 mg | ORAL_CAPSULE | ORAL | Status: DC

## 2023-12-14 MED ORDER — IOHEXOL 300 MG/ML  SOLN
100.0000 mL | Freq: Once | INTRAMUSCULAR | Status: AC | PRN
  Administered 2023-12-14: 100 mL via INTRAVENOUS

## 2023-12-14 MED ORDER — ONDANSETRON HCL 4 MG/2ML IJ SOLN
4.0000 mg | Freq: Once | INTRAMUSCULAR | Status: AC
  Administered 2023-12-14: 4 mg via INTRAVENOUS
  Filled 2023-12-14: qty 2

## 2023-12-14 MED ORDER — ENSURE PRE-SURGERY PO LIQD: 296.0000 mL | Freq: Once | ORAL | Status: DC

## 2023-12-14 NOTE — H&P (Signed)
 History and Physical    Greg [name] FMW:[id number] DOB: [birth year] DOA: 12/14/2023  PCP: Loreli Elsie [name], MD   Chief Complaint: Abdominal pain  HPI: Greg [name] is a 58 y.o. male with medical history significant of Chrons disease who presents back to our facility after worsening abdominal pain, right upper quadrant.  He apparently had been evaluated in our system 2 days ago but per report refused admission at that time, CT chest abdomen pelvis they are done per dissection protocol was without acute findings, gallbladder at that time was unremarkable other than gallstones, CT at this intake shows pericholecystic fluid and wall thickening of the gallbladder itself.  ED discussed case with Dr. [name] general surgery, hospitalist was called for admission. Patient does not meet sepsis criteria, IV antibiotics and pain medications initiated at intake.  N.p.o. with planned surgical intervention tomorrow at 1125.  ED Course: Unremarkable, pain well-controlled with 0.5 mg Dilaudid  x 1 IV, CT abdomen pelvis as above with gallbladder changes and notable right upper quadrant pain.  Review of Systems: As per HPI otherwise denies headache fever chills chest pain shortness of breath.   Assessment/Plan Principal Problem:   Acute cholecystitis Active Problems:   Crohn's disease, question perirectal involvement    Acute cholecystitis, questionably calculus, POA - General Surgery following along, appreciate insight and recommendations -tentative lap chole 12/15/23 - Continue n.p.o. status - LR at 100 cc/h; low-dose Dilaudid  IV, oxycodone  IR p.o. as needed - Broad-spectrum antibiotics with ceftriaxone /metronidazole  ongoing - While patient does not meet sepsis criteria, and he has no fever, minimally elevated leukocytosis of 12.8 he is on Humira  and given his immunocompromise state may prolong his antibiotic course pending his surgery and postop course.  Crohn's disease, without acute flare -  Continue Humira (not due until 8/22), not currently in exacerbation  General Anxiety - Resume home low-dose Xanax , patient family reassured that given low-dose Xanax  and low-dose narcotics as above it is reasonable to take these together when needed  DVT prophylaxis: Heparin  Code Status: Full Family Communication: None Status is: Observation  Dispo: The patient is from: Home              Anticipated d/c is to: Home              Anticipated d/c date is: 24 to 48 hours pending need for surgical intervention versus supportive care              Patient currently not medically stable for discharge given ongoing need for surgical workup possible repeat imaging and labs  Consultants:  General surgery  Procedures:  Pending surgical consult   Past Medical History:  Diagnosis Date   Anxiety    Crohn's disease (HCC)    Depression    History of fatty infiltration of liver    IBS (irritable bowel syndrome)    Other vitamin B12 deficiency anemia 03/16/2013   Perianal fistula    Perirectal abscess    Vitamin D deficiency     Past Surgical History:  Procedure Laterality Date   ADENOIDECTOMY     MASS EXCISION N/A 10/22/2022   Procedure: EXCISION POSTERIOR NECK MASS;  Surgeon: Eletha Boas, MD;  Location: [facility];  Service: General;  Laterality: N/A;  GEN AND LMA   resections     done twice, ileal resections     reports that he has never smoked. He has never used smokeless tobacco. He reports that he does not drink alcohol  and does not use drugs.  No Known Allergies  Family History  Problem Relation Age of Onset   Cancer Mother        lung   Other Mother        pulmonary fibrosis    Prior to Admission medications   Medication Sig Start Date End Date Taking? Authorizing Provider  Adalimumab  (HUMIRA ) 40 MG/0.8ML PSKT Inject 40 mg into the skin every 14 (fourteen) days.    [provider]  ALPRAZolam  (XANAX ) 0.5 MG tablet Take 0.5 mg by mouth daily as needed for anxiety.  09/04/18   [provider]  COVID-19 mRNA vaccine [phone] (COMIRNATY ) syringe Inject into the muscle. 02/19/22   Luiz Channel, MD  dicyclomine  (BENTYL ) 20 MG tablet Take 1 tablet (20 mg total) by mouth 2 (two) times daily. 12/12/23   Henderly, Britni A, PA-C  influenza vac split quadrivalent PF (FLUARIX) 0.5 ML injection Inject into the muscle. 04/01/22   Luiz Channel, MD  ondansetron  (ZOFRAN -ODT) 4 MG disintegrating tablet Take 1 tablet (4 mg total) by mouth every 8 (eight) hours as needed. 12/12/23   Henderly, Britni A, PA-C  oxyCODONE  (ROXICODONE ) 5 MG immediate release tablet Take 1 tablet (5 mg total) by mouth every 4 (four) hours as needed for severe pain (pain score 7-10). 12/12/23   Henderly, Britni A, PA-C  traMADol  (ULTRAM ) 50 MG tablet Take 1 tablet (50 mg total) by mouth every 6 (six) hours as needed for moderate pain. 10/22/22   Eletha Boas, MD    Physical Exam: Vitals:   12/14/23 1307 12/14/23 1625  BP: 121/81 (!) 135/96  Pulse: (!) 116 (!) 112  Resp: 20 20  Temp: 98.1 F (36.7 C) 99.4 F (37.4 C)  TempSrc:  Oral  SpO2: 95% 95%    Constitutional: NAD, calm, comfortable Vitals:   12/14/23 1307 12/14/23 1625  BP: 121/81 (!) 135/96  Pulse: (!) 116 (!) 112  Resp: 20 20  Temp: 98.1 F (36.7 C) 99.4 F (37.4 C)  TempSrc:  Oral  SpO2: 95% 95%   General:  Pleasantly resting in bed, No acute distress. HEENT:  Normocephalic atraumatic.  Sclerae nonicteric, noninjected.  Extraocular movements intact bilaterally. Neck:  Without mass or deformity.  Trachea is midline. Lungs:  Clear to auscultate bilaterally without rhonchi, wheeze, or rales. Heart:  Regular rate and rhythm.  Without murmurs, rubs, or gallops. Abdomen: Right upper quadrant tenderness to deep palpation Extremities: Without cyanosis, clubbing, edema, or obvious deformity. Skin:  Warm and dry, no erythema..  Labs on Admission: I have personally reviewed following labs and imaging  studies  CBC: Recent Labs  Lab 12/12/23 2046 12/14/23 1347  WBC 11.3* 12.8*  HGB 17.3* 16.3  HCT 49.8 49.2  MCV 83.7 84.7  PLT 217 204   Basic Metabolic Panel: Recent Labs  Lab 12/12/23 2046 12/14/23 1347  NA 133* 125*  K 3.6 3.7  CL 95* 94*  CO2 22 21*  GLUCOSE 152* 146*  BUN 12 12  CREATININE 1.23 1.17  CALCIUM 10.1 8.7*   GFR: CrCl cannot be calculated (Unknown ideal weight.). Liver Function Tests: Recent Labs  Lab 12/12/23 2046 12/14/23 1347  AST 46* 26  ALT 43 25  ALKPHOS 84 60  BILITOT 0.9 1.8*  PROT 9.1* 8.4*  ALBUMIN 4.6 3.6   Recent Labs  Lab 12/12/23 2046 12/14/23 1347  LIPASE 29 29    Urine analysis:    Component Value Date/Time   COLORURINE COLORLESS (A) 12/12/2023 2046   APPEARANCEUR CLEAR 12/12/2023 2046  LABSPEC 1.033 (H) 12/12/2023 2046   PHURINE 7.0 12/12/2023 2046   GLUCOSEU NEGATIVE 12/12/2023 2046   HGBUR TRACE (A) 12/12/2023 2046   BILIRUBINUR NEGATIVE 12/12/2023 2046   KETONESUR NEGATIVE 12/12/2023 2046   PROTEINUR TRACE (A) 12/12/2023 2046   UROBILINOGEN 0.2 09/18/2013 2203   NITRITE NEGATIVE 12/12/2023 2046   LEUKOCYTESUR NEGATIVE 12/12/2023 2046    Radiological Exams on Admission: CT ABDOMEN PELVIS W CONTRAST Result Date: 12/14/2023 CLINICAL DATA:  Right-sided abdominal pain for several days EXAM: CT ABDOMEN AND PELVIS WITH CONTRAST TECHNIQUE: Multidetector CT imaging of the abdomen and pelvis was performed using the standard protocol following bolus administration of intravenous contrast. RADIATION DOSE REDUCTION: This exam was performed according to the departmental dose-optimization program which includes automated exposure control, adjustment of the mA and/or kV according to patient size and/or use of iterative reconstruction technique. CONTRAST:  OMNIPAQUE  IOHEXOL  300 MG/ML  SOLN COMPARISON:  12/12/2023 FINDINGS: Lower chest: Mild atelectatic changes are noted in the bases bilaterally. Fluid is noted in the distal  esophagus which may be related to reflux. Hepatobiliary: Fatty infiltration of the liver is noted. The gallbladder is well distended with gallstones and significant wall thickening and mild pericholecystic fluid. These changes are consistent with acute cholecystitis in the appropriate clinical setting. Ultrasound may be helpful for further evaluation. Pancreas: Unremarkable. No pancreatic ductal dilatation or surrounding inflammatory changes. Spleen: Normal in size without focal abnormality. Adrenals/Urinary Tract: Adrenal glands are within normal limits. Bilateral simple renal cysts are again seen and stable. No follow-up is recommended. No renal calculi or obstructive changes are noted. The bladder is partially distended. Stomach/Bowel: No obstructive or inflammatory changes of the colon are noted. Postsurgical changes in the region of the proximal transverse colon are seen. Some mild stenosis is noted in this region likely related to postoperative scarring. No definitive obstructive changes are seen. The appendix has been surgically removed. Small bowel and stomach are unremarkable. Vascular/Lymphatic: No significant vascular findings are present. No enlarged abdominal or pelvic lymph nodes. Reproductive: Uterus and bilateral adnexa are unremarkable. Other: Persistent soft tissue swelling is noted over the mons pubis similar to that seen on the prior exam. No discrete abscess is seen. Stable reactive inguinal nodes are noted. No free fluid is noted. Musculoskeletal: No acute or significant osseous findings. IMPRESSION: Significant change in the gallbladder when compared with the prior exam. Gallstones are now in the region of the gallbladder neck with wall thickening and pericholecystic fluid highly suspicious for acute cholecystitis in the appropriate clinical setting. Ultrasound may be helpful for further evaluation. Fatty liver. Esophageal reflux. Persistent soft tissue swelling in the region of the mons pubis  stable from the prior exam. Some reactive adenopathy is noted. Electronically Signed   By: Oneil Devonshire M.D.   On: 12/14/2023 15:40   CT Angio Chest/Abd/Pel for Dissection W and/or Wo Contrast Result Date: 12/12/2023 CLINICAL DATA:  Aortic aneurysm suspected. Crohn's disease and abdominal pain. EXAM: CT ANGIOGRAPHY CHEST, ABDOMEN AND PELVIS TECHNIQUE: Noncontrast CT chest was performed. Multidetector CT imaging through the chest, abdomen and pelvis was performed using the standard protocol during bolus administration of intravenous contrast. Multiplanar reconstructed images and MIPs were obtained and reviewed to evaluate the vascular anatomy. RADIATION DOSE REDUCTION: This exam was performed according to the departmental dose-optimization program which includes automated exposure control, adjustment of the mA and/or kV according to patient size and/or use of iterative reconstruction technique. CONTRAST:  OMNIPAQUE  IOHEXOL  350 MG/ML SOLN COMPARISON:  CT abdomen and  pelvis 02/10/2019 FINDINGS: CTA CHEST FINDINGS Cardiovascular: Preferential opacification of the thoracic aorta. No evidence of thoracic aortic aneurysm or dissection. Normal heart size. No pericardial effusion. Mediastinum/Nodes: No enlarged lymph nodes are seen. Visualized thyroid gland is within normal limits. There is diffuse distal esophageal wall thickening. Lungs/Pleura: There are small bands of atelectasis in the left lower lobe and lingula. The lungs are otherwise clear. There is no pleural effusion or pneumothorax. Musculoskeletal: No chest wall abnormality. No acute or significant osseous findings. Review of the MIP images confirms the above findings. CTA ABDOMEN AND PELVIS FINDINGS VASCULAR Aorta: Normal caliber aorta without aneurysm, dissection, vasculitis or significant stenosis. Celiac: Patent without evidence of aneurysm, dissection, vasculitis or significant stenosis. SMA: Patent without evidence of aneurysm, dissection,  vasculitis or significant stenosis. Renals: Both renal arteries are patent without evidence of aneurysm, dissection, vasculitis, fibromuscular dysplasia or significant stenosis. IMA: Patent without evidence of aneurysm, dissection, vasculitis or significant stenosis. Inflow: Patent without evidence of aneurysm, dissection, vasculitis or significant stenosis. Veins: No obvious venous abnormality within the limitations of this arterial phase study. Review of the MIP images confirms the above findings. NON-VASCULAR Hepatobiliary: No focal liver abnormality is seen. No gallstones, gallbladder wall thickening, or biliary dilatation. Pancreas: Unremarkable. No pancreatic ductal dilatation or surrounding inflammatory changes. Spleen: Normal in size without focal abnormality. Adrenals/Urinary Tract: There is a 5.7 cm cyst in the right kidney. Additional smaller cyst is seen in the superior pole the right kidney. There is a 4.1 cm cyst in the left kidney. There is no hydronephrosis or perinephric fat stranding. The adrenal glands and bladder are within normal limits. Stomach/Bowel: Stomach is within normal limits. Appendix is not visualized. No evidence of bowel wall thickening, distention, or inflammatory changes. Lymphatic: There are mildly enlarged bilateral inguinal lymph nodes measuring up to 13 mm. No other enlarged abdominal or pelvic lymph nodes are seen. Reproductive: Prostate is unremarkable. Other: There is no ascites or free air. There is no focal abdominal wall hernia. There is some infiltration and hyperdensity in the fat anterior and inferior to the pubic symphysis extending to the level of the penis similar to prior. No soft tissue gas. No definitive fluid collection identified. Musculoskeletal: No acute fractures are seen. Review of the MIP images confirms the above findings. IMPRESSION: 1. No evidence for aortic dissection or aneurysm. 2. Diffuse distal esophageal wall thickening may represent esophagitis.  3. Infiltration and hyperdensity in the fat anterior and inferior to the pubic symphysis extending to the level of the penis similar to prior. No soft tissue gas or definitive fluid collection identified. Findings may be related to chronic inflammation, hematoma, infection or other soft tissue abnormality. Correlate clinically. 4. Mildly enlarged bilateral inguinal lymph nodes, likely reactive. 5. Bilateral renal cysts. No follow-up imaging necessary. Electronically Signed   By: Greig Pique M.D.   On: 12/12/2023 21:57    EKG: Independently reviewed.  Normal sinus rhythm   Elsie JAYSON Montclair DO [facility] For contact please use secure messenger on Epic  If 7PM-7AM, please contact night-coverage located on [email or web address]   12/14/2023, 4:43 PM

## 2023-12-14 NOTE — ED Triage Notes (Signed)
 Pt c/o right side abdominal pain and upper right side back pain since Friday. Pt states he has not had a BM since Friday. Pt has Crohn's disease and history of blockages.

## 2023-12-14 NOTE — Consult Note (Signed)
 Greg [name]  [birth year] [id number]  CARE TEAM:  PCP: Loreli Elsie [name], MD  Outpatient Care Team: Patient Care Team: Loreli Elsie [name], MD as PCP - General (Internal Medicine) Loreli Elsie [name], MD (Internal Medicine) Sheffield, Andrez SAUNDERS, PA-C (Inactive) as Physician Assistant (Dermatology) Eletha Boas, MD as Consulting Physician (General Surgery) Rosalie Kitchens, MD as Consulting Physician (Gastroenterology)  Inpatient Treatment Team: Treatment Team:  Lue Elsie BROCKS, MD Kennyth Avelina KIDD, NT Janina Lowery LABOR, NT Rankin, Laporte, NT Bobbette Hercules, MD Claudina Barrows, [place] Ccs, Md, MD   This patient is a 58 y.o.male who presents today for surgical evaluation at the request of Dr Aletha.   Chief complaint / Reason for evaluation: Abdominal pain probable cholecystitis  58 year old male.  Familiar with our group.  History of Crohn's disease.  Port ileal resection in 1998.  Required ileocolonic resection by Dr. Ethyl with our group in 2003.  Followed by [facility] gastroenterology.  Chronically immunosuppressed on Humira .  Initially followed by Dr. Donnald but since he is retired is transition over to Dr. Rosalie.  No major Crohn's flares that he is aware of.  Has had worsening abdominal pain for the past week.  Primarily right-sided.  Feels like it is more lower than upper abdomen.  Came to the emergency department 2 days ago.  Workup negative for any Crohn's flare or cholecystitis or other major concerns.  Immunosuppression admission offered but he he was feeling better and decided to hold off.  Initially did well but then started having worsening cramping.  Has been constipated for several days.  Now obstipated.  More intense cramping with some nausea.  Decreased appetite.  Had been prescribed narcotics to help manage pain but he held off since he uses alprazolam  for anxiety and did not want to overdo it.  Came emergency department.  Reevaluation more concerning  for gallbladder wall thickening and edema concerning for cholecystitis.  Surgical consultation requested.  Patient wife and Dr. Lue with internal medicine at bedside.  Patient claims he can walk 1/2-hour without difficulty.  He has had the 2 abdominal surgeries for bowel resections for Crohn's but no other abdominal surgery.  Some irregular bowels and chronic diarrhea but that is relatively stable and felt most likely due to Crohn's and possible IBS.  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  Denies any urinary incontinence or UTIs.  No history of stroke or heart attack.  Had a soft tissue mass excised by Dr. Eletha with our group last year of benign pathology without any major concerns.   Assessment  Greg [name]  58 y.o. male       Problem List:  Principal Problem:   Acute cholecystitis Active Problems:   Crohn's disease, question perirectal involvement   Worsening crampy abdominal pain right-sided with evidence of Murphy sign.  Seems to be more in the right lateral subcostal/flank region.  Concerning for cholecystitis  Plan:  Admission  IV fluids.  Nausea pain control.  IV antibiotics.  No strong evidence of Crohn's flare to my evaluation with no ileal thickening or other concerns.  Chronically suppressed on Humira .  I think he would benefit from cholecystectomy especially in light of him being immunosuppressed.  Dr. Lue is skeptical of any other etiologies for his symptoms as well.  Hopefully can do it perhaps as soon as tomorrow.  Dr. Ebbie coming on the service.  Will tentatively post and  final recommendations pending on reevaluation in the morning. The anatomy & physiology of hepatobiliary & pancreatic function was discussed.  The pathophysiology of gallbladder dysfunction was discussed.  Natural history risks without surgery was discussed.   I feel the risks of no intervention will lead to serious problems that  outweigh the operative risks; therefore, I recommended cholecystectomy to remove the pathology.  I explained laparoscopic techniques with possible need for an open approach.  Probable cholangiogram to evaluate the bilary tract was explained as well.    Risks such as bleeding, infection, diarrhea and other bowel changes, abscess, leak, injury to other organs, need for repair of tissues / organs, need for further treatment, stroke, heart attack, death, and other risks were discussed.  I noted a good likelihood this will help address the problem, but there is a chance it may not help.  Possibility that this will not correct all abdominal symptoms was explained.  Goals of post-operative recovery were discussed as well.  We will work to minimize complications.  An educational handout further explaining the pathology and treatment options was given as well.  Questions were answered.  The patient and his wife both expressed understanding and wished to proceed with surgery.  Some hyponatremia.  Perhaps some normal saline hydration and reevaluate.  Seen by Dr. Lue thinks.  -monitor electrolytes & replace as needed  Keep K>4, Mg>2, Phos>3  -VTE prophylaxis- SCDs.  Anticoagulation prophyllaxis SQ as appropriate  -mobilize as tolerated to help recovery.  Enlist therapies in moderate/high risk patients as appropriate  I updated the patient's status to the patient, his spouse, and Dr. Lue with internal medicine service recommendations were made.  Questions were answered.  They expressed understanding & appreciation.       I reviewed nursing notes, ED provider notes, hospitalist notes, last 24 h vitals and pain scores, last 48 h intake and output, last 24 h labs and trends, and last 24 h imaging results. I have reviewed this patient's available data, including medical history, events of note, test results, etc as part of my evaluation.  A significant portion of that time was spent in counseling.   Care during the described time interval was provided by me.  This care required moderate level of medical decision making.  12/14/2023  Elspeth KYM Schultze, MD, FACS, MASCRS Esophageal, Gastrointestinal & Colorectal Surgery Robotic and Minimally Invasive Surgery  [facility] [address] [place], [place] [address] [phone] Fax [phone] Main  CONTACT INFORMATION: Weekday (9AM-5PM): Call [facility] main office at [phone] Weeknight (5PM-9AM) or Weekend/Holiday: Check EPIC Web Links tab & use AMION (password  [email or web address]) for General Surgery [facility] coverage  Please, DO NOT use SecureChat  (it is not reliable communication to reach operating surgeons & will lead to a delay in care).   Epic staff messaging available for outptient concerns needing 1-2 business day response.      12/14/2023      Past Medical History:  Diagnosis Date   Anxiety    Crohn's disease (HCC)    Depression    History of fatty infiltration of liver    IBS (irritable bowel syndrome)    Other vitamin B12 deficiency anemia 03/16/2013   Perianal fistula    Perirectal abscess    Vitamin D deficiency     Past Surgical History:  Procedure Laterality Date   ADENOIDECTOMY     MASS EXCISION N/A 10/22/2022   Procedure: EXCISION POSTERIOR NECK MASS;  Surgeon: Eletha Boas, MD;  Location: [facility];  Service: General;  Laterality: N/A;  GEN AND LMA   resections     done twice, ileal resections    Social History   Socioeconomic History   Marital status: Single    Spouse name: Not on file   Number of children: 0   Years of education: college   Highest education level: Not on file  Occupational History   Occupation: self employed    Employer: [employer]  Tobacco Use   Smoking status: Never   Smokeless tobacco: Never  Vaping Use   Vaping status: Never Used  Substance and Sexual Activity   Alcohol  use: Never   Drug use: No   Sexual  activity: Never  Other Topics Concern   Not on file  Social History Narrative   Not on file   Social Drivers of Health   Financial Resource Strain: Not on file  Food Insecurity: Not on file  Transportation Needs: Not on file  Physical Activity: Not on file  Stress: Not on file  Social Connections: Not on file  Intimate Partner Violence: Not on file    Family History  Problem Relation Age of Onset   Cancer Mother        lung   Other Mother        pulmonary fibrosis    Current Facility-Administered Medications  Medication Dose Route Frequency Provider Last Rate Last Admin   heparin  injection 5,000 Units  5,000 Units Subcutaneous Q8H Lue Elsie BROCKS, MD       HYDROmorphone  (DILAUDID ) injection 0.5 mg  0.5 mg Intravenous Q8H PRN Lue Elsie BROCKS, MD       lactated ringers  infusion   Intravenous Continuous Lue Elsie BROCKS, MD       metroNIDAZOLE  (FLAGYL ) IVPB 500 mg  500 mg Intravenous Once Francesca Elsie CROME, MD       oxyCODONE  (Oxy IR/ROXICODONE ) immediate release tablet 2.5-5 mg  2.5-5 mg Oral Q6H PRN Lue Elsie BROCKS, MD       Current Outpatient Medications  Medication Sig Dispense Refill   acetaminophen  (TYLENOL ) 500 MG tablet Take 1,000 mg by mouth every 8 (eight) hours as needed.     Adalimumab  (HUMIRA ) 40 MG/0.8ML PSKT Inject 40 mg into the skin every 14 (fourteen) days.     ALPRAZolam  (XANAX ) 0.5 MG tablet Take 0.5 mg by mouth daily as needed for anxiety.     dicyclomine  (BENTYL ) 20 MG tablet Take 1 tablet (20 mg total) by mouth 2 (two) times daily. 20 tablet 0   omega-3 acid ethyl esters (LOVAZA) 1 g capsule Take 2 capsules by mouth 2 (two) times daily.     ondansetron  (ZOFRAN -ODT) 4 MG disintegrating tablet Take 1 tablet (4 mg total) by mouth every 8 (eight) hours as needed. 20 tablet 0   oxyCODONE  (ROXICODONE ) 5 MG immediate release tablet Take 1 tablet (5 mg total) by mouth every 4 (four) hours as needed for severe pain (pain score 7-10). 15 tablet  0   pantoprazole  (PROTONIX ) 40 MG tablet Take 40 mg by mouth daily as needed.       No Known Allergies  ROS:   All other systems reviewed & are negative except per HPI or as noted below: Constitutional:  No fevers, chills, sweats.  Weight stable Eyes:  No vision changes, No discharge HENT:  No sore throats, nasal drainage Lymph: No neck swelling, No bruising easily Pulmonary:  No cough, productive sputum CV: No orthopnea, PND  Patient walks 30 minutes without difficulty.  No exertional chest/neck/shoulder/arm pain.  GI: Crohn's disease as noted above stable on Humira  followed by [facility].  No recent flares.  No personal nor family history of GI/colon cancer, allergy such as Celiac Sprue, dietary/dairy problems, colitis, ulcers nor gastritis.  No recent sick contacts/gastroenteritis.  No travel outside the country.  No changes in diet.  Renal: No UTIs, No hematuria Genital:  No drainage, bleeding, masses Musculoskeletal: No severe joint pain.  Good ROM major joints Skin:  No sores or lesions Heme/Lymph:  No easy bleeding.  No swollen lymph nodes   BP (!) 136/91 (BP Location: Right Arm)   Pulse (!) 101   Temp 99.8 F (37.7 C) (Oral)   Resp 20   SpO2 97%   Physical Exam:  Constitutional: Not cachectic.  Hygeine adequate.  Vitals signs as above.   Eyes: Pupils reactive, normal extraocular movements. Sclera nonicteric.  Wears glasses.  Vision adequate Neuro: CN II-XII intact.  No major focal sensory defects.  No major motor deficits. Lymph: No head/neck/groin lymphadenopathy Psych:  No severe agitation.  No severe anxiety.  Judgment & insight Adequate, Oriented x4, HENT: Normocephalic, Mucus membranes moist.  No thrush.   Neck: Supple, No tracheal deviation.  No obvious thyromegaly Chest: No pain to chest wall compression.  Good respiratory excursion.  No audible wheezing CV:  Pulses intact.  regular rhythm.  No major extremity edema  Abdomen:  Obese Hernia: Not  present. Diastasis recti: Not present. Soft.   Mildly distended.  Discomfort in right upper quadrant on lateral subcostal ridge near anterior axillary line.  Right lower quadrant now particularly tender.  No epigastric, left upper quadrant, periumbilical pain..  No hepatomegaly.  No splenomegaly  Gen:  Inguinal hernia: Not present.  Inguinal lymph nodes: without lymphadenopathy.    Rectal: (Deferred)  Ext: No obvious deformity or contracture.  Edema: Not present.  No cyanosis Skin: No major subcutaneous nodules.  Warm and dry Musculoskeletal: Severe joint rigidity not present.  No obvious clubbing.  No digital petechiae.     Results:   Labs: Results for orders placed or performed during the hospital encounter of 12/14/23 (from the past 48 hours)  Lipase, blood     Status: None   Collection Time: 12/14/23  1:47 PM  Result Value Ref Range   Lipase 29 11 - 51 U/L    Comment: Performed at [facility], [address], [place], [place] [address]  Comprehensive metabolic panel     Status: Abnormal   Collection Time: 12/14/23  1:47 PM  Result Value Ref Range   Sodium 125 (L) 135 - 145 mmol/L   Potassium 3.7 3.5 - 5.1 mmol/L   Chloride 94 (L) 98 - 111 mmol/L   CO2 21 (L) 22 - 32 mmol/L   Glucose, Bld 146 (H) 70 - 99 mg/dL    Comment: Glucose reference range applies only to samples taken after fasting for at least 8 hours.   BUN 12 6 - 20 mg/dL   Creatinine, Ser 8.82 0.61 - 1.24 mg/dL   Calcium 8.7 (L) 8.9 - 10.3 mg/dL   Total Protein 8.4 (H) 6.5 - 8.1 g/dL   Albumin 3.6 3.5 - 5.0 g/dL   AST 26 15 - 41 U/L   ALT 25 0 - 44 U/L   Alkaline Phosphatase 60 38 - 126 U/L   Total Bilirubin 1.8 (H) 0.0 - 1.2 mg/dL   GFR, Estimated >39 >39 mL/min  Comment: (NOTE) Calculated using the CKD-EPI Creatinine Equation (2021)    Anion gap 10 5 - 15    Comment: Performed at [facility], [address] [place], [place] [address]  CBC     Status: Abnormal    Collection Time: 12/14/23  1:47 PM  Result Value Ref Range   WBC 12.8 (H) 4.0 - 10.5 K/uL   RBC 5.81 4.22 - 5.81 MIL/uL   Hemoglobin 16.3 13.0 - 17.0 g/dL   HCT 50.7 60.9 - 47.9 %   MCV 84.7 80.0 - 100.0 fL   MCH 28.1 26.0 - 34.0 pg   MCHC 33.1 30.0 - 36.0 g/dL   RDW 87.0 88.4 - 84.4 %   Platelets 204 150 - 400 K/uL   nRBC 0.0 0.0 - 0.2 %    Comment: Performed at [facility], [address] [place], [place] [address]    Imaging / Studies: US  Abdomen Limited RUQ (LIVER/GB) Result Date: 12/14/2023 CLINICAL DATA:  Cholelithiasis. EXAM: ULTRASOUND ABDOMEN LIMITED RIGHT UPPER QUADRANT COMPARISON:  12/14/2023. FINDINGS: Gallbladder: Stones are noted at the gallbladder neck/cystic duct. There is mild gallbladder wall thickening at 4.7 mm. Pericholecystic fluid is noted. No sonographic Murphy sign noted by sonographer. Common bile duct: Diameter: 7.8 mm Liver: No focal lesion identified. Increased parenchymal echogenicity. Portal vein is patent on color Doppler imaging with normal direction of blood flow towards the liver. Other: None. IMPRESSION: 1. Stones in the region of the gallbladder neck/cystic duct with gallbladder wall thickening and pericholecystic fluid. Sonographer reports a negative Murphy sign. Correlate clinically to exclude acute cholecystitis. 2. Hepatic steatosis. Electronically Signed   By: Leita Birmingham M.D.   On: 12/14/2023 16:46   CT ABDOMEN PELVIS W CONTRAST Result Date: 12/14/2023 CLINICAL DATA:  Right-sided abdominal pain for several days EXAM: CT ABDOMEN AND PELVIS WITH CONTRAST TECHNIQUE: Multidetector CT imaging of the abdomen and pelvis was performed using the standard protocol following bolus administration of intravenous contrast. RADIATION DOSE REDUCTION: This exam was performed according to the departmental dose-optimization program which includes automated exposure control, adjustment of the mA and/or kV according to patient size and/or use of  iterative reconstruction technique. CONTRAST:  OMNIPAQUE  IOHEXOL  300 MG/ML  SOLN COMPARISON:  12/12/2023 FINDINGS: Lower chest: Mild atelectatic changes are noted in the bases bilaterally. Fluid is noted in the distal esophagus which may be related to reflux. Hepatobiliary: Fatty infiltration of the liver is noted. The gallbladder is well distended with gallstones and significant wall thickening and mild pericholecystic fluid. These changes are consistent with acute cholecystitis in the appropriate clinical setting. Ultrasound may be helpful for further evaluation. Pancreas: Unremarkable. No pancreatic ductal dilatation or surrounding inflammatory changes. Spleen: Normal in size without focal abnormality. Adrenals/Urinary Tract: Adrenal glands are within normal limits. Bilateral simple renal cysts are again seen and stable. No follow-up is recommended. No renal calculi or obstructive changes are noted. The bladder is partially distended. Stomach/Bowel: No obstructive or inflammatory changes of the colon are noted. Postsurgical changes in the region of the proximal transverse colon are seen. Some mild stenosis is noted in this region likely related to postoperative scarring. No definitive obstructive changes are seen. The appendix has been surgically removed. Small bowel and stomach are unremarkable. Vascular/Lymphatic: No significant vascular findings are present. No enlarged abdominal or pelvic lymph nodes. Reproductive: Uterus and bilateral adnexa are unremarkable. Other: Persistent soft tissue swelling is noted over the mons pubis similar to that seen on the prior exam. No  discrete abscess is seen. Stable reactive inguinal nodes are noted. No free fluid is noted. Musculoskeletal: No acute or significant osseous findings. IMPRESSION: Significant change in the gallbladder when compared with the prior exam. Gallstones are now in the region of the gallbladder neck with wall thickening and pericholecystic fluid  highly suspicious for acute cholecystitis in the appropriate clinical setting. Ultrasound may be helpful for further evaluation. Fatty liver. Esophageal reflux. Persistent soft tissue swelling in the region of the mons pubis stable from the prior exam. Some reactive adenopathy is noted. Electronically Signed   By: Oneil Devonshire M.D.   On: 12/14/2023 15:40   CT Angio Chest/Abd/Pel for Dissection W and/or Wo Contrast Result Date: 12/12/2023 CLINICAL DATA:  Aortic aneurysm suspected. Crohn's disease and abdominal pain. EXAM: CT ANGIOGRAPHY CHEST, ABDOMEN AND PELVIS TECHNIQUE: Noncontrast CT chest was performed. Multidetector CT imaging through the chest, abdomen and pelvis was performed using the standard protocol during bolus administration of intravenous contrast. Multiplanar reconstructed images and MIPs were obtained and reviewed to evaluate the vascular anatomy. RADIATION DOSE REDUCTION: This exam was performed according to the departmental dose-optimization program which includes automated exposure control, adjustment of the mA and/or kV according to patient size and/or use of iterative reconstruction technique. CONTRAST:  OMNIPAQUE  IOHEXOL  350 MG/ML SOLN COMPARISON:  CT abdomen and pelvis 02/10/2019 FINDINGS: CTA CHEST FINDINGS Cardiovascular: Preferential opacification of the thoracic aorta. No evidence of thoracic aortic aneurysm or dissection. Normal heart size. No pericardial effusion. Mediastinum/Nodes: No enlarged lymph nodes are seen. Visualized thyroid gland is within normal limits. There is diffuse distal esophageal wall thickening. Lungs/Pleura: There are small bands of atelectasis in the left lower lobe and lingula. The lungs are otherwise clear. There is no pleural effusion or pneumothorax. Musculoskeletal: No chest wall abnormality. No acute or significant osseous findings. Review of the MIP images confirms the above findings. CTA ABDOMEN AND PELVIS FINDINGS VASCULAR Aorta: Normal caliber  aorta without aneurysm, dissection, vasculitis or significant stenosis. Celiac: Patent without evidence of aneurysm, dissection, vasculitis or significant stenosis. SMA: Patent without evidence of aneurysm, dissection, vasculitis or significant stenosis. Renals: Both renal arteries are patent without evidence of aneurysm, dissection, vasculitis, fibromuscular dysplasia or significant stenosis. IMA: Patent without evidence of aneurysm, dissection, vasculitis or significant stenosis. Inflow: Patent without evidence of aneurysm, dissection, vasculitis or significant stenosis. Veins: No obvious venous abnormality within the limitations of this arterial phase study. Review of the MIP images confirms the above findings. NON-VASCULAR Hepatobiliary: No focal liver abnormality is seen. No gallstones, gallbladder wall thickening, or biliary dilatation. Pancreas: Unremarkable. No pancreatic ductal dilatation or surrounding inflammatory changes. Spleen: Normal in size without focal abnormality. Adrenals/Urinary Tract: There is a 5.7 cm cyst in the right kidney. Additional smaller cyst is seen in the superior pole the right kidney. There is a 4.1 cm cyst in the left kidney. There is no hydronephrosis or perinephric fat stranding. The adrenal glands and bladder are within normal limits. Stomach/Bowel: Stomach is within normal limits. Appendix is not visualized. No evidence of bowel wall thickening, distention, or inflammatory changes. Lymphatic: There are mildly enlarged bilateral inguinal lymph nodes measuring up to 13 mm. No other enlarged abdominal or pelvic lymph nodes are seen. Reproductive: Prostate is unremarkable. Other: There is no ascites or free air. There is no focal abdominal wall hernia. There is some infiltration and hyperdensity in the fat anterior and inferior to the pubic symphysis extending to the level of the penis similar to prior. No soft tissue gas. No  definitive fluid collection identified.  Musculoskeletal: No acute fractures are seen. Review of the MIP images confirms the above findings. IMPRESSION: 1. No evidence for aortic dissection or aneurysm. 2. Diffuse distal esophageal wall thickening may represent esophagitis. 3. Infiltration and hyperdensity in the fat anterior and inferior to the pubic symphysis extending to the level of the penis similar to prior. No soft tissue gas or definitive fluid collection identified. Findings may be related to chronic inflammation, hematoma, infection or other soft tissue abnormality. Correlate clinically. 4. Mildly enlarged bilateral inguinal lymph nodes, likely reactive. 5. Bilateral renal cysts. No follow-up imaging necessary. Electronically Signed   By: Greig Pique M.D.   On: 12/12/2023 21:57    Medications / Allergies: per chart  Antibiotics: Anti-infectives (From admission, onward)    Start     Dose/Rate Route Frequency Ordered Stop   12/14/23 1615  cefTRIAXone  (ROCEPHIN ) 2 g in sodium chloride  0.9 % 100 mL IVPB        2 g 200 mL/hr over 30 Minutes Intravenous  Once 12/14/23 1602 12/14/23 1658   12/14/23 1615  metroNIDAZOLE  (FLAGYL ) IVPB 500 mg        500 mg 100 mL/hr over 60 Minutes Intravenous  Once 12/14/23 1602           Note: Portions of this report may have been transcribed using voice recognition software. Every effort was made to ensure accuracy; however, inadvertent computerized transcription errors may be present.   Any transcriptional errors that result from this process are unintentional.    Elspeth KYM Schultze, MD, FACS, MASCRS Esophageal, Gastrointestinal & Colorectal Surgery Robotic and Minimally Invasive Surgery  [facility] [address] [place], [place] [address] [phone] Fax [phone] Main  CONTACT INFORMATION: Weekday (9AM-5PM): Call [facility] main office at [phone] Weeknight (5PM-9AM) or Weekend/Holiday: Check EPIC Web Links tab  & use AMION (password  [email or web address]) for General Surgery [facility] coverage  Please, DO NOT use SecureChat  (it is not reliable communication to reach operating surgeons & will lead to a delay in care).   Epic staff messaging available for outptient concerns needing 1-2 business day response.       12/14/2023  5:39 PM

## 2023-12-14 NOTE — ED Provider Notes (Signed)
 [place] EMERGENCY DEPARTMENT AT [facility] Provider Note  CSN: [id number] Arrival date & time: 12/14/23 1302  Chief Complaint(s) Abdominal Pain  HPI Greg [name] is a 58 y.o. male history of Crohn's disease, prior bowel obstruction presenting to the emergency department with abdominal pain.  Reports pain in the right upper and lower abdomen, initially went to ER on Friday had CT scan which was negative.  Symptoms improved but then worsened over the weekend.  Reports that he has not had a bowel movement since Friday.  Not having much nausea and has not vomited.  Was prescribed oxycodone  but did not take it as he also takes Xanax  for anxiety and was worried that it could interfere.  No fevers, chills.  No other new symptoms.  Reports that Crohn's has been well-controlled since being on Humira .   Past Medical History Past Medical History:  Diagnosis Date   Anxiety    Crohn's disease (HCC)    Depression    History of fatty infiltration of liver    IBS (irritable bowel syndrome)    Other vitamin B12 deficiency anemia 03/16/2013   Perianal fistula    Perirectal abscess    Vitamin D deficiency    Patient Active Problem List   Diagnosis Date Noted   Acute cholecystitis 12/14/2023   Mass of soft tissue of neck 10/14/2022   SBO (small bowel obstruction) (HCC) 09/18/2013   Suprapubic mass 07/27/2013   Small bowel obstruction (HCC) 07/24/2013   Acute Crohn's disease with intestinal obstruction (HCC) 07/24/2013   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 Authorizing Provider  Adalimumab  (HUMIRA ) 40 MG/0.8ML PSKT Inject 40 mg into the skin every 14 (fourteen) days.    [provider]  ALPRAZolam  (XANAX )  0.5 MG tablet Take 0.5 mg by mouth daily as needed for anxiety. 09/04/18   [provider]  COVID-19 mRNA vaccine [phone] (COMIRNATY ) syringe Inject into the muscle. 02/19/22   Luiz Channel, MD  dicyclomine  (BENTYL ) 20 MG tablet Take 1 tablet (20 mg total) by mouth 2 (two) times daily. 12/12/23   Henderly, Britni A, PA-C  influenza vac split quadrivalent PF (FLUARIX) 0.5 ML injection Inject into the muscle. 04/01/22   Luiz Channel, MD  ondansetron  (ZOFRAN -ODT) 4 MG disintegrating tablet Take 1 tablet (4 mg total) by mouth every 8 (eight) hours as needed. 12/12/23   Henderly, Britni A, PA-C  oxyCODONE  (ROXICODONE ) 5 MG immediate release tablet Take 1 tablet (5 mg total) by mouth every 4 (four) hours as needed for severe pain (pain score 7-10). 12/12/23   Henderly, Britni A, PA-C  traMADol  (ULTRAM ) 50 MG tablet Take 1 tablet (50 mg total) by mouth every 6 (six) hours as needed for moderate pain. 10/22/22   Eletha Boas, MD  Past Surgical History Past Surgical History:  Procedure Laterality Date   ADENOIDECTOMY     MASS EXCISION N/A 10/22/2022   Procedure: EXCISION POSTERIOR NECK MASS;  Surgeon: Eletha Boas, MD;  Location: [facility];  Service: General;  Laterality: N/A;  GEN AND LMA   resections     done twice, ileal resections   Family History Family History  Problem Relation Age of Onset   Cancer Mother        lung   Other Mother        pulmonary fibrosis    Social History Social History   Tobacco Use   Smoking status: Never   Smokeless tobacco: Never  Vaping Use   Vaping status: Never Used  Substance Use Topics   Alcohol  use: Never   Drug use: No   Allergies Patient has no known allergies.  Review of Systems Review of Systems  All other systems reviewed and are negative.   Physical Exam Vital Signs  I have reviewed the triage vital  signs BP (!) 135/96 (BP Location: Right Arm)   Pulse (!) 112   Temp 99.4 F (37.4 C) (Oral)   Resp 20   SpO2 95%  Physical Exam Vitals and nursing note reviewed.  Constitutional:      General: He is not in acute distress.    Appearance: Normal appearance.  HENT:     Mouth/Throat:     Mouth: Mucous membranes are dry.  Eyes:     Conjunctiva/sclera: Conjunctivae normal.  Cardiovascular:     Rate and Rhythm: Normal rate and regular rhythm.  Pulmonary:     Effort: Pulmonary effort is normal. No respiratory distress.     Breath sounds: Normal breath sounds.  Abdominal:     General: Abdomen is flat.     Palpations: Abdomen is soft.     Tenderness: There is abdominal tenderness in the right upper quadrant and right lower quadrant.  Musculoskeletal:     Right lower leg: No edema.     Left lower leg: No edema.  Skin:    General: Skin is warm and dry.     Capillary Refill: Capillary refill takes less than 2 seconds.  Neurological:     Mental Status: He is alert and oriented to person, place, and time. Mental status is at baseline.  Psychiatric:        Mood and Affect: Mood normal.        Behavior: Behavior normal.     ED Results and Treatments Labs (all labs ordered are listed, but only abnormal results are displayed) Labs Reviewed  COMPREHENSIVE METABOLIC PANEL WITH GFR - Abnormal; Notable for the following components:      Result Value   Sodium 125 (*)    Chloride 94 (*)    CO2 21 (*)    Glucose, Bld 146 (*)    Calcium 8.7 (*)    Total Protein 8.4 (*)    Total Bilirubin 1.8 (*)    All other components within normal limits  CBC - Abnormal; Notable for the following components:   WBC 12.8 (*)    All other components within normal limits  LIPASE, BLOOD  URINALYSIS, ROUTINE W REFLEX MICROSCOPIC  HIV ANTIBODY (ROUTINE TESTING W REFLEX)  Radiology US   Abdomen Limited RUQ (LIVER/GB) Result Date: 12/14/2023 CLINICAL DATA:  Cholelithiasis. EXAM: ULTRASOUND ABDOMEN LIMITED RIGHT UPPER QUADRANT COMPARISON:  12/14/2023. FINDINGS: Gallbladder: Stones are noted at the gallbladder neck/cystic duct. There is mild gallbladder wall thickening at 4.7 mm. Pericholecystic fluid is noted. No sonographic Murphy sign noted by sonographer. Common bile duct: Diameter: 7.8 mm Liver: No focal lesion identified. Increased parenchymal echogenicity. Portal vein is patent on color Doppler imaging with normal direction of blood flow towards the liver. Other: None. IMPRESSION: 1. Stones in the region of the gallbladder neck/cystic duct with gallbladder wall thickening and pericholecystic fluid. Sonographer reports a negative Murphy sign. Correlate clinically to exclude acute cholecystitis. 2. Hepatic steatosis. Electronically Signed   By: Leita Birmingham M.D.   On: 12/14/2023 16:46   CT ABDOMEN PELVIS W CONTRAST Result Date: 12/14/2023 CLINICAL DATA:  Right-sided abdominal pain for several days EXAM: CT ABDOMEN AND PELVIS WITH CONTRAST TECHNIQUE: Multidetector CT imaging of the abdomen and pelvis was performed using the standard protocol following bolus administration of intravenous contrast. RADIATION DOSE REDUCTION: This exam was performed according to the departmental dose-optimization program which includes automated exposure control, adjustment of the mA and/or kV according to patient size and/or use of iterative reconstruction technique. CONTRAST:  OMNIPAQUE  IOHEXOL  300 MG/ML  SOLN COMPARISON:  12/12/2023 FINDINGS: Lower chest: Mild atelectatic changes are noted in the bases bilaterally. Fluid is noted in the distal esophagus which may be related to reflux. Hepatobiliary: Fatty infiltration of the liver is noted. The gallbladder is well distended with gallstones and significant wall thickening and mild pericholecystic fluid. These changes are consistent with acute  cholecystitis in the appropriate clinical setting. Ultrasound may be helpful for further evaluation. Pancreas: Unremarkable. No pancreatic ductal dilatation or surrounding inflammatory changes. Spleen: Normal in size without focal abnormality. Adrenals/Urinary Tract: Adrenal glands are within normal limits. Bilateral simple renal cysts are again seen and stable. No follow-up is recommended. No renal calculi or obstructive changes are noted. The bladder is partially distended. Stomach/Bowel: No obstructive or inflammatory changes of the colon are noted. Postsurgical changes in the region of the proximal transverse colon are seen. Some mild stenosis is noted in this region likely related to postoperative scarring. No definitive obstructive changes are seen. The appendix has been surgically removed. Small bowel and stomach are unremarkable. Vascular/Lymphatic: No significant vascular findings are present. No enlarged abdominal or pelvic lymph nodes. Reproductive: Uterus and bilateral adnexa are unremarkable. Other: Persistent soft tissue swelling is noted over the mons pubis similar to that seen on the prior exam. No discrete abscess is seen. Stable reactive inguinal nodes are noted. No free fluid is noted. Musculoskeletal: No acute or significant osseous findings. IMPRESSION: Significant change in the gallbladder when compared with the prior exam. Gallstones are now in the region of the gallbladder neck with wall thickening and pericholecystic fluid highly suspicious for acute cholecystitis in the appropriate clinical setting. Ultrasound may be helpful for further evaluation. Fatty liver. Esophageal reflux. Persistent soft tissue swelling in the region of the mons pubis stable from the prior exam. Some reactive adenopathy is noted. Electronically Signed   By: Oneil Devonshire M.D.   On: 12/14/2023 15:40    Pertinent labs & imaging results that were available during my care of the patient were reviewed by me and  considered in my medical decision making (see MDM for details).  Medications Ordered in ED Medications  cefTRIAXone  (ROCEPHIN ) 2 g in sodium chloride  0.9 % 100 mL IVPB (2  g Intravenous New Bag/Given 12/14/23 1628)  metroNIDAZOLE  (FLAGYL ) IVPB 500 mg (has no administration in time range)  heparin  injection 5,000 Units (has no administration in time range)  lactated ringers  infusion (has no administration in time range)  oxyCODONE  (Oxy IR/ROXICODONE ) immediate release tablet 2.5-5 mg (has no administration in time range)  HYDROmorphone  (DILAUDID ) injection 0.5 mg (has no administration in time range)  HYDROmorphone  (DILAUDID ) injection 0.5 mg (0.5 mg Intravenous Given 12/14/23 1435)  sodium chloride  0.9 % bolus 1,000 mL (1,000 mLs Intravenous New Bag/Given 12/14/23 1435)  ondansetron  (ZOFRAN ) injection 4 mg (4 mg Intravenous Given 12/14/23 1434)  iohexol  (OMNIPAQUE ) 300 MG/ML solution 100 mL (100 mLs Intravenous Contrast Given 12/14/23 1447)                                                                                                                                     Procedures Procedures  (including critical care time)  Medical Decision Making / ED Course   MDM:  58 year old presenting to the emergency with abdominal pain.  Patient appears somewhat dehydrated.  Labs with mild hyponatremia new from previous.  Does have some tenderness on exam.  Differential includes Crohn's exacerbation, abscess, perforation, volvulus, obstruction.  Had CT scan a couple days ago without evidence of any vascular process.  Will repeat CT scan given persistent/worsening symptoms and reassess.  Clinical Course as of 12/14/23 1654  Sun Dec 14, 2023  1649 Workup notable for evidence of cholecystitis on testing.  Discussed with Dr. [name] with surgery.  Recommends medicine admission and they will consult.  Discussed with Dr. Lue with hospitalist.  They will admit patient. [WS]    Clinical Course User  Index [WS] Francesca Elsie CROME, MD     Additional history obtained: -Additional history obtained from family -External records from outside source obtained and reviewed including: Chart review including previous notes, labs, imaging, consultation notes including prior ER visit/imaging    Lab Tests: -I ordered, reviewed, and interpreted labs.   The pertinent results include:   Labs Reviewed  COMPREHENSIVE METABOLIC PANEL WITH GFR - Abnormal; Notable for the following components:      Result Value   Sodium 125 (*)    Chloride 94 (*)    CO2 21 (*)    Glucose, Bld 146 (*)    Calcium 8.7 (*)    Total Protein 8.4 (*)    Total Bilirubin 1.8 (*)    All other components within normal limits  CBC - Abnormal; Notable for the following components:   WBC 12.8 (*)    All other components within normal limits  LIPASE, BLOOD  URINALYSIS, ROUTINE W REFLEX MICROSCOPIC  HIV ANTIBODY (ROUTINE TESTING W REFLEX)    Notable for leukocytosis, hyponatremia   Imaging Studies ordered: I ordered imaging studies including Ct abdomen, RUQ US   On my interpretation imaging demonstrates cholecystitis  I independently visualized and interpreted imaging. I agree with the  radiologist interpretation   Medicines ordered and prescription drug management: Meds ordered this encounter  Medications   HYDROmorphone  (DILAUDID ) injection 0.5 mg   sodium chloride  0.9 % bolus 1,000 mL   ondansetron  (ZOFRAN ) injection 4 mg   iohexol  (OMNIPAQUE ) 300 MG/ML solution 100 mL   cefTRIAXone  (ROCEPHIN ) 2 g in sodium chloride  0.9 % 100 mL IVPB    Antibiotic Indication::   Intra-abdominal   metroNIDAZOLE  (FLAGYL ) IVPB 500 mg    Antibiotic Indication::   Intra-abdominal Infection   heparin  injection 5,000 Units   lactated ringers  infusion   DISCONTD: HYDROmorphone  (DILAUDID ) injection 0.5 mg   oxyCODONE  (Oxy IR/ROXICODONE ) immediate release tablet 2.5-5 mg    Refill:  0   HYDROmorphone  (DILAUDID ) injection 0.5 mg     -I have reviewed the patients home medicines and have made adjustments as needed   Consultations Obtained: I requested consultation with the general surgeon ,  and discussed lab and imaging findings as well as pertinent plan - they recommend: admission   Cardiac Monitoring: The patient was maintained on a cardiac monitor.  I personally viewed and interpreted the cardiac monitored which showed an underlying rhythm of: NSR  Reevaluation: After the interventions noted above, I reevaluated the patient and found that their symptoms have improved  Co morbidities that complicate the patient evaluation  Past Medical History:  Diagnosis Date   Anxiety    Crohn's disease (HCC)    Depression    History of fatty infiltration of liver    IBS (irritable bowel syndrome)    Other vitamin B12 deficiency anemia 03/16/2013   Perianal fistula    Perirectal abscess    Vitamin D deficiency       Dispostion: Disposition decision including need for hospitalization was considered, and patient admitted to the hospital.    Final Clinical Impression(s) / ED Diagnoses Final diagnoses:  Acute cholecystitis     This chart was dictated using voice recognition software.  Despite best efforts to proofread,  errors can occur which can change the documentation meaning.    Francesca Elsie CROME, MD 12/14/23 [phone]

## 2023-12-15 ENCOUNTER — Encounter (HOSPITAL_COMMUNITY): Payer: Self-pay | Admitting: Internal Medicine

## 2023-12-15 ENCOUNTER — Other Ambulatory Visit: Payer: Self-pay

## 2023-12-15 ENCOUNTER — Inpatient Hospital Stay (HOSPITAL_COMMUNITY): Admitting: Anesthesiology

## 2023-12-15 ENCOUNTER — Encounter (HOSPITAL_COMMUNITY)
Admission: EM | Disposition: A | Discharge status: Home / Self Care | Payer: Self-pay | Source: Home / Self Care | Attending: Internal Medicine

## 2023-12-15 DIAGNOSIS — K810 Acute cholecystitis: Secondary | ICD-10-CM | POA: Diagnosis not present

## 2023-12-15 HISTORY — PX: CHOLECYSTECTOMY: SHX55

## 2023-12-15 LAB — COMPREHENSIVE METABOLIC PANEL WITH GFR
ALT: 18 U/L (ref 0–44)
AST: 18 U/L (ref 15–41)
Albumin: 3.1 g/dL — ABNORMAL LOW (ref 3.5–5.0)
Alkaline Phosphatase: 58 U/L (ref 38–126)
Anion gap: 12 (ref 5–15)
BUN: 16 mg/dL (ref 6–20)
CO2: 23 mmol/L (ref 22–32)
Calcium: 8.4 mg/dL — ABNORMAL LOW (ref 8.9–10.3)
Chloride: 96 mmol/L — ABNORMAL LOW (ref 98–111)
Creatinine, Ser: 1.18 mg/dL (ref 0.61–1.24)
GFR, Estimated: >60 mL/min (ref >60)
Glucose, Bld: 104 mg/dL — ABNORMAL HIGH (ref 70–99)
Potassium: 3.8 mmol/L (ref 3.5–5.1)
Sodium: 131 mmol/L — ABNORMAL LOW (ref 135–145)
Total Bilirubin: 1.6 mg/dL — ABNORMAL HIGH (ref 0.0–1.2)
Total Protein: 7.8 g/dL (ref 6.5–8.1)

## 2023-12-15 LAB — CBC
HCT: 48.7 % (ref 39.0–52.0)
Hemoglobin: 15.9 g/dL (ref 13.0–17.0)
MCH: 28.9 pg (ref 26.0–34.0)
MCHC: 32.6 g/dL (ref 30.0–36.0)
MCV: 88.4 fL (ref 80.0–100.0)
Platelets: 188 K/uL (ref 150–400)
RBC: 5.51 MIL/uL (ref 4.22–5.81)
RDW: 13.4 % (ref 11.5–15.5)
WBC: 15.6 K/uL — ABNORMAL HIGH (ref 4.0–10.5)
nRBC: 0 % (ref 0.0–0.2)

## 2023-12-15 LAB — HIV ANTIBODY (ROUTINE TESTING W REFLEX): HIV Screen 4th Generation wRfx: NONREACTIVE

## 2023-12-15 LAB — MRSA NEXT GEN BY PCR, NASAL: MRSA by PCR Next Gen: DETECTED — AB

## 2023-12-15 SURGERY — LAPAROSCOPIC CHOLECYSTECTOMY
Anesthesia: General

## 2023-12-15 MED ORDER — PROPOFOL 10 MG/ML IV BOLUS
INTRAVENOUS | Status: DC | PRN
  Administered 2023-12-15 (×2): 160 mg via INTRAVENOUS

## 2023-12-15 MED ORDER — DEXAMETHASONE SODIUM PHOSPHATE 10 MG/ML IJ SOLN
INTRAMUSCULAR | Status: AC
  Filled 2023-12-15: qty 5

## 2023-12-15 MED ORDER — ROCURONIUM BROMIDE 100 MG/10ML IV SOLN
INTRAVENOUS | Status: DC | PRN
  Administered 2023-12-15: 10 mg via INTRAVENOUS
  Administered 2023-12-15: 60 mg via INTRAVENOUS
  Administered 2023-12-15: 20 mg via INTRAVENOUS
  Administered 2023-12-15: 10 mg via INTRAVENOUS
  Administered 2023-12-15: 20 mg via INTRAVENOUS
  Administered 2023-12-15: 10 mg via INTRAVENOUS
  Administered 2023-12-15: 60 mg via INTRAVENOUS
  Administered 2023-12-15: 10 mg via INTRAVENOUS

## 2023-12-15 MED ORDER — LACTATED RINGERS IV SOLN: INTRAVENOUS | Status: DC

## 2023-12-15 MED ORDER — OXYCODONE HCL 5 MG/5ML PO SOLN
ORAL | Status: AC
  Filled 2023-12-15: qty 5

## 2023-12-15 MED ORDER — 0.9 % SODIUM CHLORIDE (POUR BTL) OPTIME
TOPICAL | Status: DC | PRN
Start: 2023-12-15 — End: 2023-12-15
  Administered 2023-12-15 (×2): 1000 mL

## 2023-12-15 MED ORDER — MIDAZOLAM HCL 2 MG/2ML IJ SOLN
INTRAMUSCULAR | Status: AC
Start: 2023-12-15 — End: 2023-12-15
  Filled 2023-12-15: qty 2

## 2023-12-15 MED ORDER — FENTANYL CITRATE (PF) 100 MCG/2ML IJ SOLN
INTRAMUSCULAR | Status: AC
  Filled 2023-12-15: qty 2

## 2023-12-15 MED ORDER — CELECOXIB 200 MG PO CAPS
ORAL_CAPSULE | ORAL | Status: AC
  Filled 2023-12-15: qty 1

## 2023-12-15 MED ORDER — LABETALOL HCL 5 MG/ML IV SOLN
5.0000 mg | INTRAVENOUS | Status: DC | PRN
  Administered 2023-12-15 (×4): 5 mg via INTRAVENOUS

## 2023-12-15 MED ORDER — CEFAZOLIN SODIUM 1 G IJ SOLR
INTRAMUSCULAR | Status: AC
  Filled 2023-12-15: qty 20

## 2023-12-15 MED ORDER — LACTATED RINGERS IR SOLN
Status: DC | PRN
  Administered 2023-12-15 (×4): 1000 mL

## 2023-12-15 MED ORDER — HYDRALAZINE HCL 20 MG/ML IJ SOLN
INTRAMUSCULAR | Status: AC
  Administered 2023-12-15 (×2): 10 mg via INTRAVENOUS
  Filled 2023-12-15: qty 1

## 2023-12-15 MED ORDER — BUPIVACAINE-EPINEPHRINE 0.25% -1:200000 IJ SOLN
INTRAMUSCULAR | Status: DC | PRN
  Administered 2023-12-15 (×2): 5 mL

## 2023-12-15 MED ORDER — DEXAMETHASONE SODIUM PHOSPHATE 10 MG/ML IJ SOLN
INTRAMUSCULAR | Status: DC | PRN
  Administered 2023-12-15 (×2): 10 mg via INTRAVENOUS

## 2023-12-15 MED ORDER — ONDANSETRON HCL 4 MG/2ML IJ SOLN
INTRAMUSCULAR | Status: DC | PRN
  Administered 2023-12-15 (×2): 4 mg via INTRAVENOUS

## 2023-12-15 MED ORDER — OXYCODONE HCL 5 MG/5ML PO SOLN
5.0000 mg | Freq: Once | ORAL | Status: AC | PRN
  Administered 2023-12-15 (×2): 5 mg via ORAL

## 2023-12-15 MED ORDER — ACETAMINOPHEN 500 MG PO TABS
ORAL_TABLET | ORAL | Status: AC
  Administered 2023-12-15 (×2): 1000 mg via ORAL
  Filled 2023-12-15: qty 2

## 2023-12-15 MED ORDER — LABETALOL HCL 5 MG/ML IV SOLN
INTRAVENOUS | Status: AC
  Filled 2023-12-15: qty 4

## 2023-12-15 MED ORDER — GABAPENTIN 300 MG PO CAPS
ORAL_CAPSULE | ORAL | Status: AC
  Administered 2023-12-15 (×2): 300 mg via ORAL
  Filled 2023-12-15: qty 1

## 2023-12-15 MED ORDER — METRONIDAZOLE 500 MG/100ML IV SOLN
500.0000 mg | Freq: Two times a day (BID) | INTRAVENOUS | Status: DC
  Administered 2023-12-15 – 2023-12-23 (×22): 500 mg via INTRAVENOUS
  Filled 2023-12-15 (×17): qty 100

## 2023-12-15 MED ORDER — ONDANSETRON HCL 4 MG/2ML IJ SOLN
INTRAMUSCULAR | Status: AC
  Filled 2023-12-15: qty 10

## 2023-12-15 MED ORDER — FENTANYL CITRATE PF 50 MCG/ML IJ SOSY
PREFILLED_SYRINGE | INTRAMUSCULAR | Status: AC
Start: 2023-12-15 — End: 2023-12-15
  Filled 2023-12-15: qty 1

## 2023-12-15 MED ORDER — PROPOFOL 10 MG/ML IV BOLUS
INTRAVENOUS | Status: AC
  Filled 2023-12-15: qty 20

## 2023-12-15 MED ORDER — ONDANSETRON HCL 4 MG/2ML IJ SOLN: 4.0000 mg | Freq: Once | INTRAMUSCULAR | Status: DC | PRN

## 2023-12-15 MED ORDER — HEMOSTATIC AGENTS (NO CHARGE) OPTIME
TOPICAL | Status: DC | PRN
Start: 2023-12-15 — End: 2023-12-15
  Administered 2023-12-15 (×4): 1 via TOPICAL

## 2023-12-15 MED ORDER — FENTANYL CITRATE (PF) 100 MCG/2ML IJ SOLN
INTRAMUSCULAR | Status: DC | PRN
  Administered 2023-12-15: 100 ug via INTRAVENOUS
  Administered 2023-12-15 (×4): 50 ug via INTRAVENOUS
  Administered 2023-12-15: 100 ug via INTRAVENOUS

## 2023-12-15 MED ORDER — MUPIROCIN 2 % EX OINT
TOPICAL_OINTMENT | Freq: Two times a day (BID) | CUTANEOUS | Status: DC
  Administered 2023-12-17 – 2023-12-18 (×4): 1 via NASAL
  Filled 2023-12-15 (×2): qty 22

## 2023-12-15 MED ORDER — LIDOCAINE HCL (CARDIAC) PF 100 MG/5ML IV SOSY
PREFILLED_SYRINGE | INTRAVENOUS | Status: DC | PRN
  Administered 2023-12-15 (×2): 100 mg via INTRAVENOUS

## 2023-12-15 MED ORDER — SODIUM CHLORIDE 0.9 % IV SOLN
2.0000 g | INTRAVENOUS | Status: DC
  Administered 2023-12-16 – 2023-12-22 (×9): 2 g via INTRAVENOUS
  Filled 2023-12-15 (×8): qty 20

## 2023-12-15 MED ORDER — CHLORHEXIDINE GLUCONATE 0.12 % MT SOLN
15.0000 mL | Freq: Once | OROMUCOSAL | Status: AC
  Administered 2023-12-15 (×2): 15 mL via OROMUCOSAL

## 2023-12-15 MED ORDER — INDOCYANINE GREEN 25 MG IV SOLR: 25.0000 mg | Freq: Once | INTRAVENOUS | Status: DC

## 2023-12-15 MED ORDER — MIDAZOLAM HCL 5 MG/5ML IJ SOLN
INTRAMUSCULAR | Status: DC | PRN
  Administered 2023-12-15 (×2): 2 mg via INTRAVENOUS

## 2023-12-15 MED ORDER — ENSURE PRE-SURGERY PO LIQD
296.0000 mL | Freq: Once | ORAL | Status: AC
  Administered 2023-12-15 (×2): 296 mL via ORAL
  Filled 2023-12-15: qty 296

## 2023-12-15 MED ORDER — SPY AGENT GREEN - (INDOCYANINE FOR INJECTION)
1.2500 mg | Freq: Once | INTRAMUSCULAR | Status: AC
  Administered 2023-12-15 (×2): 1.25 mg via INTRAVENOUS

## 2023-12-15 MED ORDER — ALPRAZOLAM 0.25 MG PO TABS
0.2500 mg | ORAL_TABLET | Freq: Every day | ORAL | Status: DC | PRN
  Administered 2023-12-15 – 2023-12-16 (×4): 0.25 mg via ORAL
  Filled 2023-12-15 (×3): qty 1

## 2023-12-15 MED ORDER — FENTANYL CITRATE PF 50 MCG/ML IJ SOSY
25.0000 ug | PREFILLED_SYRINGE | INTRAMUSCULAR | Status: DC | PRN
  Administered 2023-12-15 (×6): 50 ug via INTRAVENOUS

## 2023-12-15 MED ORDER — FENTANYL CITRATE PF 50 MCG/ML IJ SOSY
PREFILLED_SYRINGE | INTRAMUSCULAR | Status: AC
  Filled 2023-12-15: qty 2

## 2023-12-15 MED ORDER — BUPIVACAINE-EPINEPHRINE (PF) 0.25% -1:200000 IJ SOLN
INTRAMUSCULAR | Status: AC
  Filled 2023-12-15: qty 30

## 2023-12-15 MED ORDER — ACETAMINOPHEN 10 MG/ML IV SOLN
1000.0000 mg | Freq: Once | INTRAVENOUS | Status: DC | PRN
Start: 2023-12-15 — End: 2023-12-15

## 2023-12-15 MED ORDER — SUGAMMADEX SODIUM 200 MG/2ML IV SOLN
INTRAVENOUS | Status: DC | PRN
  Administered 2023-12-15 (×2): 250 mg via INTRAVENOUS

## 2023-12-15 MED ORDER — OXYCODONE HCL 5 MG PO TABS: 5.0000 mg | ORAL_TABLET | Freq: Once | ORAL | Status: AC | PRN

## 2023-12-15 SURGICAL SUPPLY — 41 items
BAG COUNTER SPONGE SURGICOUNT (BAG) IMPLANT
BENZOIN TINCTURE PRP APPL 2/3 (GAUZE/BANDAGES/DRESSINGS) IMPLANT
CABLE HIGH FREQUENCY MONO STRZ (ELECTRODE) ×1 IMPLANT
CHLORAPREP W/TINT 26 (MISCELLANEOUS) ×1 IMPLANT
CLIP APPLIE 5 13 M/L LIGAMAX5 (MISCELLANEOUS) ×1 IMPLANT
CLIP APPLIE ROT 10 11.4 M/L (STAPLE) IMPLANT
COVER MAYO STAND XLG (MISCELLANEOUS) ×1 IMPLANT
COVER SURGICAL LIGHT HANDLE (MISCELLANEOUS) ×1 IMPLANT
DEFOGGER SCOPE WARM SEASHARP (MISCELLANEOUS) IMPLANT
DERMABOND ADVANCED .7 DNX12 (GAUZE/BANDAGES/DRESSINGS) IMPLANT
DRAIN CHANNEL 19F RND (DRAIN) IMPLANT
DRAPE C-ARM 42X120 X-RAY (DRAPES) IMPLANT
ELECT PENCIL ROCKER SW 15FT (MISCELLANEOUS) IMPLANT
ELECT REM PT RETURN 15FT ADLT (MISCELLANEOUS) ×1 IMPLANT
ELECTRODE L-HOOK LAP 45CM DISP (ELECTROSURGICAL) IMPLANT
GLOVE BIO SURGEON STRL SZ7 (GLOVE) ×1 IMPLANT
GLOVE BIOGEL PI IND STRL 7.5 (GLOVE) ×1 IMPLANT
GOWN STRL REUS W/ TWL LRG LVL3 (GOWN DISPOSABLE) ×1 IMPLANT
GRASPER SUT TROCAR 14GX15 (MISCELLANEOUS) IMPLANT
HEMOSTAT SNOW SURGICEL 2X4 (HEMOSTASIS) IMPLANT
IRRIGATION SUCT STRKRFLW 2 WTP (MISCELLANEOUS) ×1 IMPLANT
KIT BASIN OR (CUSTOM PROCEDURE TRAY) ×1 IMPLANT
KIT TURNOVER KIT A (KITS) ×1 IMPLANT
PENCIL SMOKE EVACUATOR (MISCELLANEOUS) IMPLANT
POUCH RETRIEVAL ECOSAC 10 (ENDOMECHANICALS) ×1 IMPLANT
PROTECTOR NERVE ULNAR (MISCELLANEOUS) IMPLANT
SCISSORS LAP 5X35 DISP (ENDOMECHANICALS) ×1 IMPLANT
SET CHOLANGIOGRAPH MIX (MISCELLANEOUS) IMPLANT
SET TUBE SMOKE EVAC HIGH FLOW (TUBING) ×1 IMPLANT
SLEEVE Z-THREAD 5X100MM (TROCAR) ×2 IMPLANT
SPIKE FLUID TRANSFER (MISCELLANEOUS) ×1 IMPLANT
STRIP CLOSURE SKIN 1/2X4 (GAUZE/BANDAGES/DRESSINGS) IMPLANT
SUT MNCRL AB 4-0 PS2 18 (SUTURE) ×1 IMPLANT
SUT VICRYL 0 TIES 12 18 (SUTURE) IMPLANT
SUT VICRYL 0 UR6 27IN ABS (SUTURE) IMPLANT
TOWEL OR 17X26 10 PK STRL BLUE (TOWEL DISPOSABLE) ×1 IMPLANT
TRAY LAPAROSCOPIC (CUSTOM PROCEDURE TRAY) ×1 IMPLANT
TROCAR 11X100 Z THREAD (TROCAR) IMPLANT
TROCAR BALLN 12MMX100 BLUNT (TROCAR) ×1 IMPLANT
TROCAR XCEL NON-BLD 5MMX100MML (ENDOMECHANICALS) IMPLANT
TROCAR Z-THREAD OPTICAL 5X100M (TROCAR) ×1 IMPLANT

## 2023-12-15 NOTE — Op Note (Signed)
 Diagnosis: Acute cholecystitis Procedure laparoscopic fenestrated subtotal cholecystectomy.,  Laparoscopic lysis of adhesions x 1 hour Surgeon: Dr. Adina Bury Anesthesia: General Specimens none Specimens: Gallbladder Complications: None Drains: 19 French Blake drain to the right upper quadrant  sponge needle count was correct completion Estimated blood loss: 75 cc  disposition to recovery in stable condition  Indications: This is a 59 year old male with a history of Crohn's disease and a couple prior surgeries who presents with right sided ab pain.  He presented Friday and had CT and was sent home.this persisted and he had another CT and US  that shows stones at gb neck/cystic duct with gb wall thickening. He clinically has cholecystitis. Discussed lap chole.   Procedure: After informed consent obtained he was taken to the OR.  Placed under general anesthesia without complication. Antibiotics given, SCDs in place.  He was prepped and draped in standard sterile surgical fashion.  Surgical timeout was then performed.  I made a small stab incision in the midclavicular line on the left side.  I accessed the abdomen with a Veress needle without difficulty.  The abdomen was insufflated to 15 mmHg pressure.  I then inserted a 5 mm optical entry trocar without injury.  He had adhesions throughout his midline from his prior surgery.  I then placed 2 additional 5 mm trocars in the left lower quadrant.  I then proceeded for about an hour to lyse adhesions using a combination of blunt and sharp dissection.  He had bowel adherent to his abdominal wall but I reviewed this several times and there was no evidence of any injury.  I was eventually after an hour able to visualize his gallbladder.  I placed a epigastric and right sided trocars at that point.  His gallbladder was noted to be necrotic.  I aspirated and then he had white fluid present in his gallbladder.  I then attempted to dissect down to the  triangle.  I really was unable to identify any normal anatomy in that region.  And his gallbladder eventually just ripped where it was dead.  I did not think I was going to be able to remove his entire gallbladder and I did not not want to make an incision in part due to the fact that he is on Humira  and I did not think I was able to really visualize this any better open that I did laparoscopically.  I elected to remove the entire front wall of the gallbladder that I could see as well as some of the back wall.  This was a very adherent to the liver and resulted in a fair amount of bleeding every time that I got close to the liver.  I remove this portion of the gallbladder.  I irrigated inside the gallbladder and did remove what appeared to be a stone.  I think the proceeding any further would cause harm.  I elected to place some snow and then a 40 [place] Blake drain that I secured with a 2-0 nylon suture.  I left this is a subtotal fenestrated cholecystectomy.  I then removed my 11 mm trocar that I had near the umbilicus.  I closed this with 2-0 Vicryl sutures.  I then removed the remaining trocars and  desufflated the abdomen.   I then closed all the incisions with 4-0 Monocryl and glue.  He tolerated this well and was transferred to recovery.

## 2023-12-15 NOTE — Progress Notes (Signed)
 PROGRESS NOTE    Greg [name]  FMW:[id number] DOB: June 25, 1965 DOA: 12/14/2023 PCP: Greg [name]., MD   Brief Narrative:  Greg [name] is a 58 y.o. male with medical history significant of Chrons disease who presents back to our facility after worsening abdominal pain, right upper quadrant.  Admitted for acute cholecystitis-General Surgery called in consult, plan for laparoscopic cholecystectomy later today per their schedule.   Assessment & Plan: Principal Problem:   Acute cholecystitis Active Problems:   Crohn's disease, question perirectal involvement   Gastroesophageal reflux disease   Generalized anxiety disorder   Insomnia   Venous insufficiency of lower extremity   Immunosuppression due to drug therapy (Humira  for Crohns disease)   Hyponatremia  Acute cholecystitis, questionably calculus, POA - General Surgery following along, appreciate insight and recommendations - lap chole scheduled 12/15/23 - Continue n.p.o. status - LR at 100 cc/h; low-dose Dilaudid  IV, oxycodone  IR p.o. as needed - Broad-spectrum antibiotics with ceftriaxone /metronidazole  ongoing - While patient does not meet sepsis criteria, and he has no fever, minimally elevated leukocytosis of 12.8 he is on Humira  and given his immunocompromise state may prolong his antibiotic course pending his surgery and postop course.   Crohn's disease, without acute flare - Continue Humira (not due until 8/22), not currently in exacerbation   General Anxiety - Resume home low-dose Xanax , patient family reassured that given low-dose Xanax  and low-dose narcotics as above it is reasonable to take these together when needed   DVT prophylaxis: heparin  injection 5,000 Units Start: 12/15/23 0600 SCD's Start: 12/14/23 1755   Code Status:   Code Status: Full Code  Family Communication: At bedside  Status is: Inpatient  Dispo: The patient is from: Home              Anticipated d/c is to: Home               Anticipated d/c date is: 24 to 48 hours              Patient currently not medically stable for discharge  Consultants:  General Surgery  Procedures:  Laparoscopic cholecystectomy 12/15/2023  Antimicrobials:  Ceftriaxone , Flagyl   Subjective: No acute issues or events overnight, pain currently well-controlled denies nausea vomiting diarrhea constipation headache fevers chills or chest pain  Objective: Vitals:   12/14/23 1735 12/14/23 2139 12/15/23 0112 12/15/23 0446  BP: (!) 136/91 115/78 119/77 117/82  Pulse: (!) 101 (!) 102 94 91  Resp: 20 18 15 19   Temp: 99.8 F (37.7 C) (!) 97.5 F (36.4 C) 98.8 F (37.1 C) 99.7 F (37.6 C)  TempSrc: Oral Oral Oral Oral  SpO2: 97% 92% 92% 93%    Intake/Output Summary (Last 24 hours) at 12/15/2023 9281 Last data filed at 12/15/2023 0600 Gross per 24 hour  Intake 1525.87 ml  Output 300 ml  Net 1225.87 ml   There were no vitals filed for this visit.  Examination:  General:  Pleasantly resting in bed, No acute distress. HEENT:  Normocephalic atraumatic.  Sclerae nonicteric, noninjected.  Extraocular movements intact bilaterally. Neck:  Without mass or deformity.  Trachea is midline. Lungs:  Clear to auscultate bilaterally without rhonchi, wheeze, or rales. Heart:  Regular rate and rhythm.  Without murmurs, rubs, or gallops. Abdomen: Soft, moderately tender right upper quadrant Extremities: Without cyanosis, clubbing, edema, or obvious deformity. Skin:  Warm and dry, no erythema.  Data Reviewed: I have personally reviewed following labs and imaging studies  CBC: Recent Labs  Lab 12/12/23 2046  12/14/23 1347 12/15/23 0425  WBC 11.3* 12.8* 15.6*  HGB 17.3* 16.3 15.9  HCT 49.8 49.2 48.7  MCV 83.7 84.7 88.4  PLT 217 204 188   Basic Metabolic Panel: Recent Labs  Lab 12/12/23 2046 12/14/23 1347 12/15/23 0425  NA 133* 125* 131*  K 3.6 3.7 3.8  CL 95* 94* 96*  CO2 22 21* 23  GLUCOSE 152* 146* 104*  BUN 12 12 16    CREATININE 1.23 1.17 1.18  CALCIUM 10.1 8.7* 8.4*   GFR: CrCl cannot be calculated (Unknown ideal weight.). Liver Function Tests: Recent Labs  Lab 12/12/23 2046 12/14/23 1347 12/15/23 0425  AST 46* 26 18  ALT 43 25 18  ALKPHOS 84 60 58  BILITOT 0.9 1.8* 1.6*  PROT 9.1* 8.4* 7.8  ALBUMIN 4.6 3.6 3.1*   Recent Labs  Lab 12/12/23 2046 12/14/23 1347  LIPASE 29 29   No results for input(s): AMMONIA in the last 168 hours. Coagulation Profile: No results for input(s): INR, PROTIME in the last 168 hours. Cardiac Enzymes: No results for input(s): CKTOTAL, CKMB, CKMBINDEX, TROPONINI in the last 168 hours. BNP (last 3 results) No results for input(s): PROBNP in the last 8760 hours. HbA1C: No results for input(s): HGBA1C in the last 72 hours. CBG: No results for input(s): GLUCAP in the last 168 hours. Lipid Profile: No results for input(s): CHOL, HDL, LDLCALC, TRIG, CHOLHDL, LDLDIRECT in the last 72 hours. Thyroid Function Tests: No results for input(s): TSH, T4TOTAL, FREET4, T3FREE, THYROIDAB in the last 72 hours. Anemia Panel: No results for input(s): VITAMINB12, FOLATE, FERRITIN, TIBC, IRON, RETICCTPCT in the last 72 hours. Sepsis Labs: No results for input(s): PROCALCITON, LATICACIDVEN in the last 168 hours.  Recent Results (from the past 240 hours)  MRSA Next Gen by PCR, Nasal     Status: Abnormal   Collection Time: 12/14/23  9:05 PM   Specimen: Nasal Mucosa; Nasal Swab  Result Value Ref Range Status   MRSA by PCR Next Gen DETECTED (A) NOT DETECTED Final    Comment: RESULT CALLED TO, READ BACK BY AND VERIFIED WITH: B. PAUDEA, RN [phone] 12/15/23 BY ALONSO CORDS (NOTE) The GeneXpert MRSA Assay (FDA approved for NASAL specimens only), is one component of a comprehensive MRSA colonization surveillance program. It is not intended to diagnose MRSA infection nor to guide or monitor treatment for MRSA infections. Test  performance is not FDA approved in patients less than 72 years old. Performed at [facility], [address], [place], [place] [address]          Radiology Studies: US  Abdomen Limited RUQ (LIVER/GB) Result Date: 12/14/2023 CLINICAL DATA:  Cholelithiasis. EXAM: ULTRASOUND ABDOMEN LIMITED RIGHT UPPER QUADRANT COMPARISON:  12/14/2023. FINDINGS: Gallbladder: Stones are noted at the gallbladder neck/cystic duct. There is mild gallbladder wall thickening at 4.7 mm. Pericholecystic fluid is noted. No sonographic Murphy sign noted by sonographer. Common bile duct: Diameter: 7.8 mm Liver: No focal lesion identified. Increased parenchymal echogenicity. Portal vein is patent on color Doppler imaging with normal direction of blood flow towards the liver. Other: None. IMPRESSION: 1. Stones in the region of the gallbladder neck/cystic duct with gallbladder wall thickening and pericholecystic fluid. Sonographer reports a negative Murphy sign. Correlate clinically to exclude acute cholecystitis. 2. Hepatic steatosis. Electronically Signed   By: Leita Birmingham M.D.   On: 12/14/2023 16:46   CT ABDOMEN PELVIS W CONTRAST Result Date: 12/14/2023 CLINICAL DATA:  Right-sided abdominal pain for several days EXAM: CT ABDOMEN AND PELVIS WITH  CONTRAST TECHNIQUE: Multidetector CT imaging of the abdomen and pelvis was performed using the standard protocol following bolus administration of intravenous contrast. RADIATION DOSE REDUCTION: This exam was performed according to the departmental dose-optimization program which includes automated exposure control, adjustment of the mA and/or kV according to patient size and/or use of iterative reconstruction technique. CONTRAST:  OMNIPAQUE  IOHEXOL  300 MG/ML  SOLN COMPARISON:  12/12/2023 FINDINGS: Lower chest: Mild atelectatic changes are noted in the bases bilaterally. Fluid is noted in the distal esophagus which may be related to reflux. Hepatobiliary: Fatty  infiltration of the liver is noted. The gallbladder is well distended with gallstones and significant wall thickening and mild pericholecystic fluid. These changes are consistent with acute cholecystitis in the appropriate clinical setting. Ultrasound may be helpful for further evaluation. Pancreas: Unremarkable. No pancreatic ductal dilatation or surrounding inflammatory changes. Spleen: Normal in size without focal abnormality. Adrenals/Urinary Tract: Adrenal glands are within normal limits. Bilateral simple renal cysts are again seen and stable. No follow-up is recommended. No renal calculi or obstructive changes are noted. The bladder is partially distended. Stomach/Bowel: No obstructive or inflammatory changes of the colon are noted. Postsurgical changes in the region of the proximal transverse colon are seen. Some mild stenosis is noted in this region likely related to postoperative scarring. No definitive obstructive changes are seen. The appendix has been surgically removed. Small bowel and stomach are unremarkable. Vascular/Lymphatic: No significant vascular findings are present. No enlarged abdominal or pelvic lymph nodes. Reproductive: Uterus and bilateral adnexa are unremarkable. Other: Persistent soft tissue swelling is noted over the mons pubis similar to that seen on the prior exam. No discrete abscess is seen. Stable reactive inguinal nodes are noted. No free fluid is noted. Musculoskeletal: No acute or significant osseous findings. IMPRESSION: Significant change in the gallbladder when compared with the prior exam. Gallstones are now in the region of the gallbladder neck with wall thickening and pericholecystic fluid highly suspicious for acute cholecystitis in the appropriate clinical setting. Ultrasound may be helpful for further evaluation. Fatty liver. Esophageal reflux. Persistent soft tissue swelling in the region of the mons pubis stable from the prior exam. Some reactive adenopathy is noted.  Electronically Signed   By: Oneil Devonshire M.D.   On: 12/14/2023 15:40        Scheduled Meds:  [START ON 12/26/2023] adalimumab   40 mg Subcutaneous Q14 Days   bupivacaine  liposome  20 mL Infiltration Once   heparin   5,000 Units Subcutaneous Q8H   mupirocin  ointment   Nasal BID   pantoprazole   40 mg Oral BID   Continuous Infusions:  cefTRIAXone  (ROCEPHIN )  IV     lactated ringers  100 mL/hr at 12/15/23 0503   metronidazole  500 mg (12/15/23 0518)     LOS: 1 day   Time spent:  Elsie JAYSON Mont[place], DO [facility]  If 7PM-7AM, please contact night-coverage [email or web address]  12/15/2023, 7:18 AM

## 2023-12-15 NOTE — Anesthesia Preprocedure Evaluation (Signed)
 Anesthesia Evaluation  Patient identified by MRN, date of birth, ID band Patient awake    Reviewed: Allergy & Precautions, NPO status , Patient's Chart, lab work & pertinent test results  History of Anesthesia Complications Negative for: history of anesthetic complications  Airway Mallampati: III  TM Distance: >3 FB Neck ROM: Full  Mouth opening: Limited Mouth Opening  Dental no notable dental hx. (+) Teeth Intact   Pulmonary neg pulmonary ROS, neg sleep apnea, neg COPD, Patient abstained from smoking.Not current smoker   Pulmonary exam normal breath sounds clear to auscultation       Cardiovascular Exercise Tolerance: Good METS(-) hypertension(-) CAD and (-) Past MI negative cardio ROS (-) dysrhythmias  Rhythm:Regular Rate:Normal - Systolic murmurs    Neuro/Psych  PSYCHIATRIC DISORDERS Anxiety Depression       GI/Hepatic ,GERD  Medicated,,(+)     (-) substance abuse  Crohn's disease. 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 No date: Perirectal abscess No date: Vitamin D deficiency  Reproductive/Obstetrics                              Anesthesia Physical Anesthesia Plan  ASA: 2  Anesthesia Plan: General   Post-op Pain Management: Ofirmev  IV (intra-op)* and Toradol IV (intra-op)*   Induction: Intravenous  PONV Risk Score and Plan: 4 or greater and Ondansetron , Dexamethasone  and Midazolam   Airway Management Planned: Oral ETT  Additional Equipment: None  Intra-op Plan:    Post-operative Plan: Extubation in OR  Informed Consent: I have reviewed the patients History and Physical, chart, labs and discussed the procedure including the risks, benefits and alternatives for the proposed anesthesia with the patient or authorized representative who has indicated his/her understanding and acceptance.     Dental advisory given  Plan Discussed with: CRNA and Surgeon  Anesthesia Plan Comments: (Discussed risks of anesthesia with patient, including PONV, sore throat, lip/dental/eye damage. Rare risks discussed as well, such as cardiorespiratory and neurological sequelae, and allergic reactions. Discussed the role of CRNA in patient's perioperative care. Patient understands.)        Anesthesia Quick Evaluation

## 2023-12-15 NOTE — Plan of Care (Signed)
 ?  Problem: Clinical Measurements: ?Goal: Ability to maintain clinical measurements within normal limits will improve ?Outcome: Progressing ?Goal: Will remain free from infection ?Outcome: Progressing ?Goal: Diagnostic test results will improve ?Outcome: Progressing ?  ?

## 2023-12-15 NOTE — Anesthesia Postprocedure Evaluation (Signed)
 Anesthesia Post Note  Patient: Greg [name]  Procedure(s) Performed: LAPAROSCOPIC CHOLECYSTECTOMY; LYSIS OF ADHESIONS     Patient location during evaluation: PACU Anesthesia Type: General Level of consciousness: awake and alert Pain management: pain level controlled Vital Signs Assessment: post-procedure vital signs reviewed and stable Respiratory status: spontaneous breathing, nonlabored ventilation, respiratory function stable and patient connected to nasal cannula oxygen Cardiovascular status: blood pressure returned to baseline and stable Postop Assessment: no apparent nausea or vomiting Anesthetic complications: no   No notable events documented.  Last Vitals:  Vitals:   12/15/23 1423 12/15/23 1430  BP:  (!) 159/99  Pulse: 92 86  Resp: (!) 27 17  Temp:  36.9 C  SpO2: 92% 95%    Last Pain:  Vitals:   12/15/23 1430  TempSrc:   PainSc: Asleep                 Rome Ade

## 2023-12-15 NOTE — Progress Notes (Signed)
 * Day of Surgery *   Subjective/Chief Complaint: Ruq pain, history and imaging all reviewed, case discussed with Dr [name]   Objective: Vital signs in last 24 hours: Temp:  [97.5 F (36.4 C)-99.8 F (37.7 C)] 98.8 F (37.1 C) (08/11 0938) Pulse Rate:  [82-116] 82 (08/11 0938) Resp:  [15-20] 18 (08/11 0938) BP: (115-136)/(77-96) 117/77 (08/11 0938) SpO2:  [92 %-97 %] 93 % (08/11 0938) Weight:  [103 kg] 103 kg (08/11 0717) Last BM Date : 12/12/23  Intake/Output from previous day: 08/10 0701 - 08/11 0700 In: 1525.9 [P.O.:326; I.V.:1030.9; IV Piggyback:169] Out: 300 [Urine:300] Intake/Output this shift: No intake/output data recorded.  Ab midline incision, soft tender ruq   Lab Results:  Recent Labs    12/14/23 1347 12/15/23 0425  WBC 12.8* 15.6*  HGB 16.3 15.9  HCT 49.2 48.7  PLT 204 188   BMET Recent Labs    12/14/23 1347 12/15/23 0425  NA 125* 131*  K 3.7 3.8  CL 94* 96*  CO2 21* 23  GLUCOSE 146* 104*  BUN 12 16  CREATININE 1.17 1.18  CALCIUM 8.7* 8.4*   PT/INR No results for input(s): LABPROT, INR in the last 72 hours. ABG No results for input(s): PHART, HCO3 in the last 72 hours.  Invalid input(s): PCO2, PO2  Studies/Results: US  Abdomen Limited RUQ (LIVER/GB) Result Date: 12/14/2023 CLINICAL DATA:  Cholelithiasis. EXAM: ULTRASOUND ABDOMEN LIMITED RIGHT UPPER QUADRANT COMPARISON:  12/14/2023. FINDINGS: Gallbladder: Stones are noted at the gallbladder neck/cystic duct. There is mild gallbladder wall thickening at 4.7 mm. Pericholecystic fluid is noted. No sonographic Murphy sign noted by sonographer. Common bile duct: Diameter: 7.8 mm Liver: No focal lesion identified. Increased parenchymal echogenicity. Portal vein is patent on color Doppler imaging with normal direction of blood flow towards the liver. Other: None. IMPRESSION: 1. Stones in the region of the gallbladder neck/cystic duct with gallbladder wall thickening and pericholecystic  fluid. Sonographer reports a negative Murphy sign. Correlate clinically to exclude acute cholecystitis. 2. Hepatic steatosis. Electronically Signed   By: Leita Birmingham M.D.   On: 12/14/2023 16:46   CT ABDOMEN PELVIS W CONTRAST Result Date: 12/14/2023 CLINICAL DATA:  Right-sided abdominal pain for several days EXAM: CT ABDOMEN AND PELVIS WITH CONTRAST TECHNIQUE: Multidetector CT imaging of the abdomen and pelvis was performed using the standard protocol following bolus administration of intravenous contrast. RADIATION DOSE REDUCTION: This exam was performed according to the departmental dose-optimization program which includes automated exposure control, adjustment of the mA and/or kV according to patient size and/or use of iterative reconstruction technique. CONTRAST:  OMNIPAQUE  IOHEXOL  300 MG/ML  SOLN COMPARISON:  12/12/2023 FINDINGS: Lower chest: Mild atelectatic changes are noted in the bases bilaterally. Fluid is noted in the distal esophagus which may be related to reflux. Hepatobiliary: Fatty infiltration of the liver is noted. The gallbladder is well distended with gallstones and significant wall thickening and mild pericholecystic fluid. These changes are consistent with acute cholecystitis in the appropriate clinical setting. Ultrasound may be helpful for further evaluation. Pancreas: Unremarkable. No pancreatic ductal dilatation or surrounding inflammatory changes. Spleen: Normal in size without focal abnormality. Adrenals/Urinary Tract: Adrenal glands are within normal limits. Bilateral simple renal cysts are again seen and stable. No follow-up is recommended. No renal calculi or obstructive changes are noted. The bladder is partially distended. Stomach/Bowel: No obstructive or inflammatory changes of the colon are noted. Postsurgical changes in the region of the proximal transverse colon are seen. Some mild stenosis is noted in this region  likely related to postoperative scarring. No definitive  obstructive changes are seen. The appendix has been surgically removed. Small bowel and stomach are unremarkable. Vascular/Lymphatic: No significant vascular findings are present. No enlarged abdominal or pelvic lymph nodes. Reproductive: Uterus and bilateral adnexa are unremarkable. Other: Persistent soft tissue swelling is noted over the mons pubis similar to that seen on the prior exam. No discrete abscess is seen. Stable reactive inguinal nodes are noted. No free fluid is noted. Musculoskeletal: No acute or significant osseous findings. IMPRESSION: Significant change in the gallbladder when compared with the prior exam. Gallstones are now in the region of the gallbladder neck with wall thickening and pericholecystic fluid highly suspicious for acute cholecystitis in the appropriate clinical setting. Ultrasound may be helpful for further evaluation. Fatty liver. Esophageal reflux. Persistent soft tissue swelling in the region of the mons pubis stable from the prior exam. Some reactive adenopathy is noted. Electronically Signed   By: Oneil Devonshire M.D.   On: 12/14/2023 15:40    Anti-infectives: Anti-infectives (From admission, onward)    Start     Dose/Rate Route Frequency Ordered Stop   12/15/23 1630  cefTRIAXone  (ROCEPHIN ) 2 g in sodium chloride  0.9 % 100 mL IVPB        2 g 200 mL/hr over 30 Minutes Intravenous Every 24 hours 12/15/23 0103     12/15/23 0600  metroNIDAZOLE  (FLAGYL ) IVPB 500 mg        500 mg 100 mL/hr over 60 Minutes Intravenous Every 12 hours 12/15/23 0103     12/14/23 1615  cefTRIAXone  (ROCEPHIN ) 2 g in sodium chloride  0.9 % 100 mL IVPB        2 g 200 mL/hr over 30 Minutes Intravenous  Once 12/14/23 1602 12/14/23 1658   12/14/23 1615  metroNIDAZOLE  (FLAGYL ) IVPB 500 mg        500 mg 100 mL/hr over 60 Minutes Intravenous  Once 12/14/23 1602 12/14/23 1910       Assessment/Plan: Acute cholecystitis -I do think has cholecystitis on exam, imaging and with wbc -discussed lap  chole.  Prior surgery may make more difficult and increase risk of open procedure as well as injury.  Humira  taken Friday also increases some risk but I think best choice is to proceed -I discussed the procedure in detail.  We discussed the risks and benefits of a laparoscopic cholecystectomy and possible cholangiogram including, but not limited to bleeding, infection, injury to surrounding structures such as the intestine or liver, bile leak, retained gallstones, need to convert to an open procedure, prolonged diarrhea, blood clots such as  DVT, common bile duct injury, anesthesia risks, and possible need for additional procedures. We also discussed small risk of subtotal cholecystectomy.  The likelihood of improvement in symptoms and return to the patient's normal status is good. We discussed the typical post-operative recovery course.   Greg [name] 12/15/2023

## 2023-12-15 NOTE — Transfer of Care (Signed)
 Immediate Anesthesia Transfer of Care Note  Patient: Greg [name]  Procedure(s) Performed: LAPAROSCOPIC CHOLECYSTECTOMY; LYSIS OF ADHESIONS  Patient Location: PACU  Anesthesia Type:General  Level of Consciousness: awake, alert , and oriented  Airway & Oxygen Therapy: Patient Spontanous Breathing and Patient connected to face mask oxygen  Post-op Assessment: Report given to RN and Post -op Vital signs reviewed and stable  Post vital signs: Reviewed and stable  Last Vitals:  Vitals Value Taken Time  BP 134/91 12/15/23 14:02  Temp    Pulse 92 12/15/23 14:06  Resp 23 12/15/23 14:06  SpO2 98 % 12/15/23 14:06  Vitals shown include unfiled device data.  Last Pain:  Vitals:   12/15/23 1106  TempSrc: Oral  PainSc:       Patients Stated Pain Goal: 2 (12/14/23 1735)  Complications: No notable events documented.

## 2023-12-15 NOTE — Anesthesia Procedure Notes (Signed)
 Procedure Name: Intubation Date/Time: 12/15/2023 11:43 AM  Performed by: Thomos Janetta DEL, CRNAPre-anesthesia Checklist: Patient identified, Emergency Drugs available, Suction available and Patient being monitored Patient Re-evaluated:Patient Re-evaluated prior to induction Oxygen Delivery Method: Circle system utilized Preoxygenation: Pre-oxygenation with 100% oxygen Induction Type: IV induction Ventilation: Mask ventilation without difficulty Laryngoscope Size: Mac and 4 Grade View: Grade I Tube type: Oral Tube size: 7.5 mm Number of attempts: 1 Airway Equipment and Method: Stylet Placement Confirmation: ETT inserted through vocal cords under direct vision, positive ETCO2 and breath sounds checked- equal and bilateral Secured at: 25 cm Tube secured with: Tape Dental Injury: Teeth and Oropharynx as per pre-operative assessment

## 2023-12-16 ENCOUNTER — Encounter (HOSPITAL_COMMUNITY): Payer: Self-pay | Admitting: General Surgery

## 2023-12-16 DIAGNOSIS — K810 Acute cholecystitis: Secondary | ICD-10-CM | POA: Diagnosis not present

## 2023-12-16 LAB — CBC
HCT: 47.3 % (ref 39.0–52.0)
Hemoglobin: 15.3 g/dL (ref 13.0–17.0)
MCH: 29 pg (ref 26.0–34.0)
MCHC: 32.3 g/dL (ref 30.0–36.0)
MCV: 89.8 fL (ref 80.0–100.0)
Platelets: 221 K/uL (ref 150–400)
RBC: 5.27 MIL/uL (ref 4.22–5.81)
RDW: 13.4 % (ref 11.5–15.5)
WBC: 13.8 K/uL — ABNORMAL HIGH (ref 4.0–10.5)
nRBC: 0 % (ref 0.0–0.2)

## 2023-12-16 LAB — COMPREHENSIVE METABOLIC PANEL WITH GFR
ALT: 24 U/L (ref 0–44)
AST: 26 U/L (ref 15–41)
Albumin: 3 g/dL — ABNORMAL LOW (ref 3.5–5.0)
Alkaline Phosphatase: 66 U/L (ref 38–126)
Anion gap: 12 (ref 5–15)
BUN: 16 mg/dL (ref 6–20)
CO2: 25 mmol/L (ref 22–32)
Calcium: 8.4 mg/dL — ABNORMAL LOW (ref 8.9–10.3)
Chloride: 94 mmol/L — ABNORMAL LOW (ref 98–111)
Creatinine, Ser: 1.34 mg/dL — ABNORMAL HIGH (ref 0.61–1.24)
GFR, Estimated: >60 mL/min (ref >60)
Glucose, Bld: 140 mg/dL — ABNORMAL HIGH (ref 70–99)
Potassium: 3.9 mmol/L (ref 3.5–5.1)
Sodium: 131 mmol/L — ABNORMAL LOW (ref 135–145)
Total Bilirubin: 1.2 mg/dL (ref 0.0–1.2)
Total Protein: 8.3 g/dL — ABNORMAL HIGH (ref 6.5–8.1)

## 2023-12-16 LAB — SURGICAL PATHOLOGY

## 2023-12-16 MED ORDER — ALPRAZOLAM 0.5 MG PO TABS
0.5000 mg | ORAL_TABLET | Freq: Two times a day (BID) | ORAL | Status: DC | PRN
  Administered 2023-12-16 – 2023-12-17 (×4): 0.5 mg via ORAL
  Filled 2023-12-16 (×2): qty 1

## 2023-12-16 MED ORDER — SODIUM CHLORIDE 0.9 % IV SOLN: INTRAVENOUS | Status: AC

## 2023-12-16 MED ORDER — ALUM & MAG HYDROXIDE-SIMETH 200-200-20 MG/5ML PO SUSP
30.0000 mL | ORAL | Status: DC | PRN
  Administered 2023-12-16 – 2023-12-21 (×3): 30 mL via ORAL
  Filled 2023-12-16 (×2): qty 30

## 2023-12-16 MED ORDER — HEPARIN SODIUM (PORCINE) 5000 UNIT/ML IJ SOLN
5000.0000 [IU] | Freq: Three times a day (TID) | INTRAMUSCULAR | Status: AC
  Administered 2023-12-16 – 2023-12-21 (×22): 5000 [IU] via SUBCUTANEOUS
  Filled 2023-12-16 (×17): qty 1

## 2023-12-16 MED ORDER — HYDROXYZINE HCL 10 MG PO TABS
10.0000 mg | ORAL_TABLET | Freq: Three times a day (TID) | ORAL | Status: DC | PRN
  Filled 2023-12-16: qty 1

## 2023-12-16 NOTE — Plan of Care (Signed)
 ?  Problem: Clinical Measurements: ?Goal: Ability to maintain clinical measurements within normal limits will improve ?Outcome: Progressing ?Goal: Will remain free from infection ?Outcome: Progressing ?Goal: Diagnostic test results will improve ?Outcome: Progressing ?  ?

## 2023-12-16 NOTE — Progress Notes (Addendum)
 Progress Note  1 Day Post-Op  Subjective: Patient ambulating well. Minimal abdominal discomfort. Has tolerated eggs this morning. Has not had a bowel movement or flatulence yet. Denies nausea or vomiting.  Patient reports that he works in the [employer]. Patient reports that this requires heavy lifting and movement.  ROS  All negative with the exception of above.  Objective: Vital signs in last 24 hours: Temp:  [97.6 F (36.4 C)-99.7 F (37.6 C)] 98 F (36.7 C) (08/12 0533) Pulse Rate:  [70-107] 107 (08/12 0533) Resp:  [14-27] 18 (08/12 0533) BP: (111-172)/(71-111) 124/96 (08/12 0533) SpO2:  [92 %-98 %] 96 % (08/12 0533) Last BM Date : 12/12/23  Intake/Output from previous day: 08/11 0701 - 08/12 0700 In: 2295.1 [P.O.:730; I.V.:1352.7; IV Piggyback:212.5] Out: 2595 [Urine:2000; Drains:495; Blood:100] Intake/Output this shift: Total I/O In: 480 [P.O.:480] Out: 330 [Urine:250; Drains:80]  PE: General: Pleasant, male who is laying in bed in NAD. Lungs: Respiratory effort nonlabored Abd: Soft and ND, Mild generalized tenderness to palpation. Incisions C/D/I. Drain present of right abdomen with brown thin fluid present in bulb. Psych: A&Ox3 with an appropriate affect.    Lab Results:  Recent Labs    12/15/23 0425 12/16/23 0441  WBC 15.6* 13.8*  HGB 15.9 15.3  HCT 48.7 47.3  PLT 188 221   BMET Recent Labs    12/15/23 0425 12/16/23 0441  NA 131* 131*  K 3.8 3.9  CL 96* 94*  CO2 23 25  GLUCOSE 104* 140*  BUN 16 16  CREATININE 1.18 1.34*  CALCIUM 8.4* 8.4*   PT/INR No results for input(s): LABPROT, INR in the last 72 hours. CMP     Component Value Date/Time   NA 131 (L) 12/16/2023 0441   K 3.9 12/16/2023 0441   CL 94 (L) 12/16/2023 0441   CO2 25 12/16/2023 0441   GLUCOSE 140 (H) 12/16/2023 0441   BUN 16 12/16/2023 0441   CREATININE 1.34 (H) 12/16/2023 0441   CALCIUM 8.4 (L) 12/16/2023 0441   PROT 8.3 (H) 12/16/2023  0441   ALBUMIN 3.0 (L) 12/16/2023 0441   AST 26 12/16/2023 0441   ALT 24 12/16/2023 0441   ALKPHOS 66 12/16/2023 0441   BILITOT 1.2 12/16/2023 0441   GFRNONAA >60 12/16/2023 0441   GFRAA >90 09/19/2013 0450   Lipase     Component Value Date/Time   LIPASE 29 12/14/2023 1347       Studies/Results: US  Abdomen Limited RUQ (LIVER/GB) Result Date: 12/14/2023 CLINICAL DATA:  Cholelithiasis. EXAM: ULTRASOUND ABDOMEN LIMITED RIGHT UPPER QUADRANT COMPARISON:  12/14/2023. FINDINGS: Gallbladder: Stones are noted at the gallbladder neck/cystic duct. There is mild gallbladder wall thickening at 4.7 mm. Pericholecystic fluid is noted. No sonographic Murphy sign noted by sonographer. Common bile duct: Diameter: 7.8 mm Liver: No focal lesion identified. Increased parenchymal echogenicity. Portal vein is patent on color Doppler imaging with normal direction of blood flow towards the liver. Other: None. IMPRESSION: 1. Stones in the region of the gallbladder neck/cystic duct with gallbladder wall thickening and pericholecystic fluid. Sonographer reports a negative Murphy sign. Correlate clinically to exclude acute cholecystitis. 2. Hepatic steatosis. Electronically Signed   By: Leita Birmingham M.D.   On: 12/14/2023 16:46   CT ABDOMEN PELVIS W CONTRAST Result Date: 12/14/2023 CLINICAL DATA:  Right-sided abdominal pain for several days EXAM: CT ABDOMEN AND PELVIS WITH CONTRAST TECHNIQUE: Multidetector CT imaging of the abdomen and pelvis was performed using the standard protocol following bolus administration of intravenous contrast.  RADIATION DOSE REDUCTION: This exam was performed according to the departmental dose-optimization program which includes automated exposure control, adjustment of the mA and/or kV according to patient size and/or use of iterative reconstruction technique. CONTRAST:  OMNIPAQUE  IOHEXOL  300 MG/ML  SOLN COMPARISON:  12/12/2023 FINDINGS: Lower chest: Mild atelectatic changes are noted  in the bases bilaterally. Fluid is noted in the distal esophagus which may be related to reflux. Hepatobiliary: Fatty infiltration of the liver is noted. The gallbladder is well distended with gallstones and significant wall thickening and mild pericholecystic fluid. These changes are consistent with acute cholecystitis in the appropriate clinical setting. Ultrasound may be helpful for further evaluation. Pancreas: Unremarkable. No pancreatic ductal dilatation or surrounding inflammatory changes. Spleen: Normal in size without focal abnormality. Adrenals/Urinary Tract: Adrenal glands are within normal limits. Bilateral simple renal cysts are again seen and stable. No follow-up is recommended. No renal calculi or obstructive changes are noted. The bladder is partially distended. Stomach/Bowel: No obstructive or inflammatory changes of the colon are noted. Postsurgical changes in the region of the proximal transverse colon are seen. Some mild stenosis is noted in this region likely related to postoperative scarring. No definitive obstructive changes are seen. The appendix has been surgically removed. Small bowel and stomach are unremarkable. Vascular/Lymphatic: No significant vascular findings are present. No enlarged abdominal or pelvic lymph nodes. Reproductive: Uterus and bilateral adnexa are unremarkable. Other: Persistent soft tissue swelling is noted over the mons pubis similar to that seen on the prior exam. No discrete abscess is seen. Stable reactive inguinal nodes are noted. No free fluid is noted. Musculoskeletal: No acute or significant osseous findings. IMPRESSION: Significant change in the gallbladder when compared with the prior exam. Gallstones are now in the region of the gallbladder neck with wall thickening and pericholecystic fluid highly suspicious for acute cholecystitis in the appropriate clinical setting. Ultrasound may be helpful for further evaluation. Fatty liver. Esophageal reflux.  Persistent soft tissue swelling in the region of the mons pubis stable from the prior exam. Some reactive adenopathy is noted. Electronically Signed   By: Oneil Devonshire M.D.   On: 12/14/2023 15:40    Anti-infectives: Anti-infectives (From admission, onward)    Start     Dose/Rate Route Frequency Ordered Stop   12/15/23 1630  cefTRIAXone  (ROCEPHIN ) 2 g in sodium chloride  0.9 % 100 mL IVPB        2 g 200 mL/hr over 30 Minutes Intravenous Every 24 hours 12/15/23 0103     12/15/23 0600  metroNIDAZOLE  (FLAGYL ) IVPB 500 mg        500 mg 100 mL/hr over 60 Minutes Intravenous Every 12 hours 12/15/23 0103     12/14/23 1615  cefTRIAXone  (ROCEPHIN ) 2 g in sodium chloride  0.9 % 100 mL IVPB        2 g 200 mL/hr over 30 Minutes Intravenous  Once 12/14/23 1602 12/14/23 1658   12/14/23 1615  metroNIDAZOLE  (FLAGYL ) IVPB 500 mg        500 mg 100 mL/hr over 60 Minutes Intravenous  Once 12/14/23 1602 12/14/23 1910        Assessment/Plan POD1: S/P Procedure laparoscopic fenestrated subtotal cholecystectomy by Dr. Donnice Bury on 12/15/2023 -Mild tachycardia. Mild hypertension -Cr 1.34 will defer IV to hospital team -WBC 13.8 from 15.6. Hemoglobin 15.3 -Tolerated soft diet. No BM/flatulence yet.  -Drain currently with 25. Total 495. -Discussed post-operative restrictions. Will provide letter for work. Will arrange outpatient follow up. -Will continue to follow   FEN:  Regular VTE: Okay to initiate sub Q hep ID: Ceftriaxone  and metronidazole     LOS: 2 days   I reviewed specialist notes nursing notes, last 24 h vitals and pain scores, last 48 h intake and output, last 24 h labs and trends, and last 24 h imaging results.   Marjorie Carlyon Favre, [facility] 12/16/2023, 8:57 AM Please see Amion for pager number during day hours 7:00am-4:30pm

## 2023-12-16 NOTE — Progress Notes (Signed)
  Progress Note   Patient: Greg [name] FMW:[id number] DOB: 05-31-1965 DOA: 12/14/2023     2 DOS: the patient was seen and examined on 12/16/2023   Brief hospital course: 58 y.o. male with medical history significant of Chrons disease who presents back to our facility after worsening abdominal pain, right upper quadrant.  Admitted for acute cholecystitis-General Surgery called in consult, plan for laparoscopic cholecystectomy later today per their schedule.   Assessment and Plan: Acute cholecystitis, questionably calculus, POA - General Surgery following along, appreciate insight and recommendations - lap chole scheduled 12/15/23 - Now on full liquids - LR at 75cc/h; low-dose Dilaudid  IV, oxycodone  IR p.o. as needed - Broad-spectrum antibiotics with ceftriaxone /metronidazole  ongoing - While patient does not meet sepsis criteria, and he has no fever, minimally elevated leukocytosis of 12.8 he is on Humira  and given his immunocompromise state may prolong his antibiotic course pending his surgery and postop course.   Crohn's disease, without acute flare - Stable, on Humira (not due until 8/22), not currently in exacerbation   General Anxiety - resume home xanax . Confirmed pt was prescribed xanax  0.5mg  BID PRN -Given increased anxiety, also added PRN vistaril  for breakthrough anxiety  ARF -Cr up to 1.34 today -Cont IVF as per above -Recheck bmet in AM       Subjective: Complains of generalized abd discomfort with increased gas/bloating  Physical Exam: Vitals:   12/16/23 0149 12/16/23 0533 12/16/23 1020 12/16/23 1401  BP: (!) 123/91 (!) 124/96 115/82 119/85  Pulse: (!) 105 (!) 107 (!) 110 96  Resp: 18 18 16 18   Temp: 97.7 F (36.5 C) 98 F (36.7 C) 99.3 F (37.4 C) 98.2 F (36.8 C)  TempSrc: Oral Oral Oral Oral  SpO2: 93% 96% 95% 95%  Weight:      Height:       General exam: Awake, laying in bed, in nad Respiratory system: Normal respiratory effort, no  wheezing Cardiovascular system: regular rate, s1, s2 Gastrointestinal system: Soft, decreased BS Central nervous system: CN2-12 grossly intact, strength intact Extremities: Perfused, no clubbing Skin: Normal skin turgor, no notable skin lesions seen Psychiatry: Mood normal // no visual hallucinations   Data Reviewed:  Labs reviewed: Na 131, K 3.9, Cr 1.34, WBC 13.8, Hgb 135.3, Plts 221  Family Communication: Pt in room, family not at bedside  Disposition: Status is: Inpatient Remains inpatient appropriate because: severity of illness  Planned Discharge Destination: Home    Author: Garnette Pelt, MD 12/16/2023 4:03 PM  For on call review [email or web address].

## 2023-12-16 NOTE — Hospital Course (Signed)
 58 y.o. male with medical history significant of Chrons disease who presents back to our facility after worsening abdominal pain, right upper quadrant.  Admitted for acute cholecystitis-General Surgery called in consult, plan for laparoscopic cholecystectomy later today per their schedule.

## 2023-12-16 NOTE — Plan of Care (Signed)

## 2023-12-16 NOTE — Progress Notes (Signed)
   12/16/23 1039  TOC Brief Assessment  Insurance and Status Reviewed  Patient has primary care physician Yes  Home environment has been reviewed home alone  Prior level of function: independent  Prior/Current Home Services No current home services  Social Drivers of Health Review SDOH reviewed no interventions necessary  Readmission risk has been reviewed Yes  Transition of care needs no transition of care needs at this time

## 2023-12-17 DIAGNOSIS — Z79899 Other long term (current) drug therapy: Secondary | ICD-10-CM

## 2023-12-17 DIAGNOSIS — F411 Generalized anxiety disorder: Secondary | ICD-10-CM | POA: Diagnosis not present

## 2023-12-17 DIAGNOSIS — E871 Hypo-osmolality and hyponatremia: Secondary | ICD-10-CM | POA: Diagnosis not present

## 2023-12-17 DIAGNOSIS — K50819 Crohn's disease of both small and large intestine with unspecified complications: Secondary | ICD-10-CM | POA: Diagnosis not present

## 2023-12-17 DIAGNOSIS — K810 Acute cholecystitis: Secondary | ICD-10-CM | POA: Diagnosis not present

## 2023-12-17 DIAGNOSIS — K219 Gastro-esophageal reflux disease without esophagitis: Secondary | ICD-10-CM

## 2023-12-17 DIAGNOSIS — D84821 Immunodeficiency due to drugs: Secondary | ICD-10-CM

## 2023-12-17 LAB — COMPREHENSIVE METABOLIC PANEL WITH GFR
ALT: 17 U/L (ref 0–44)
AST: 18 U/L (ref 15–41)
Albumin: 2.4 g/dL — ABNORMAL LOW (ref 3.5–5.0)
Alkaline Phosphatase: 51 U/L (ref 38–126)
Anion gap: 11 (ref 5–15)
BUN: 13 mg/dL (ref 6–20)
CO2: 25 mmol/L (ref 22–32)
Calcium: 7.6 mg/dL — ABNORMAL LOW (ref 8.9–10.3)
Chloride: 96 mmol/L — ABNORMAL LOW (ref 98–111)
Creatinine, Ser: 1.27 mg/dL — ABNORMAL HIGH (ref 0.61–1.24)
GFR, Estimated: >60 mL/min (ref >60)
Glucose, Bld: 126 mg/dL — ABNORMAL HIGH (ref 70–99)
Potassium: 3.8 mmol/L (ref 3.5–5.1)
Sodium: 132 mmol/L — ABNORMAL LOW (ref 135–145)
Total Bilirubin: 1.1 mg/dL (ref 0.0–1.2)
Total Protein: 6.6 g/dL (ref 6.5–8.1)

## 2023-12-17 LAB — CBC
HCT: 40.8 % (ref 39.0–52.0)
Hemoglobin: 12.8 g/dL — ABNORMAL LOW (ref 13.0–17.0)
MCH: 27.9 pg (ref 26.0–34.0)
MCHC: 31.4 g/dL (ref 30.0–36.0)
MCV: 88.9 fL (ref 80.0–100.0)
Platelets: 196 K/uL (ref 150–400)
RBC: 4.59 MIL/uL (ref 4.22–5.81)
RDW: 13.6 % (ref 11.5–15.5)
WBC: 10.4 K/uL (ref 4.0–10.5)
nRBC: 0 % (ref 0.0–0.2)

## 2023-12-17 MED ORDER — ALPRAZOLAM 0.25 MG PO TABS
0.2500 mg | ORAL_TABLET | Freq: Two times a day (BID) | ORAL | Status: DC | PRN
  Administered 2023-12-17 – 2023-12-18 (×3): 0.25 mg via ORAL
  Filled 2023-12-17 (×2): qty 1

## 2023-12-17 MED ORDER — SODIUM CHLORIDE 0.9 % IV SOLN: INTRAVENOUS | Status: DC

## 2023-12-17 NOTE — Progress Notes (Signed)
 PROGRESS NOTE    Greg [name]  FMW:[id number] DOB: 1965-12-01 DOA: 12/14/2023 PCP: Loreli Elsie [name], MD    Chief Complaint  Patient presents with   Abdominal Pain    Brief Narrative:  58 y.o. male with medical history significant of Chrons disease who presents back to our facility after worsening abdominal pain, right upper quadrant.  Admitted for acute cholecystitis-General Surgery consulted and patient underwent laparoscopic cholecystectomy 12/15/2023.     Assessment & Plan:   Principal Problem:   Acute cholecystitis Active Problems:   Crohn's disease, question perirectal involvement   Gastroesophageal reflux disease   Generalized anxiety disorder   Insomnia   Venous insufficiency of lower extremity   Immunosuppression due to drug therapy (Humira  for Crohns disease)   Hyponatremia  #1 acute cholecystitis, POA -Patient noted to have presented with worsening abdominal pain, right upper quadrant. -Right upper quadrant ultrasound done with stones in the region of gallbladder neck/cystic duct with gallbladder wall thickening and pericholecystic fluid.  Negative Murphy sign.  Hepatic steatosis. - CT abdomen and pelvis done with significant change in gallbladder when compared with prior exam.  Gallstones in the region of gallbladder neck with wall thickening and pericholecystic fluid highly suspicious for acute cholecystitis in appropriate clinical setting.  Fatty liver.  GERD.  Persistent soft tissue swelling in the region of the mons pubis stable from prior exam. - Patient seen in consultation by general surgery and patient underwent laparoscopic fenestrated subtotal cholecystectomy, laparoscopic lysis of adhesions on 12/15/2023. - Patient currently tolerating full liquids. - Continue IV fluids. -Continue empiric IV Flagyl  and IV Rocephin . -While patient does not meet sepsis criteria, and he has no fever, minimally elevated leukocytosis of 12.8 he is on Humira  and given his  immunocompromise state may prolong his antibiotic course pending his surgery and postop course.  - Per general surgery will not advance diet until patient with flatus. - Per general surgery.  2.  Crohn's disease without acute flare -Stable. - Patient on Humira  not due until 12/26/2023. - Outpatient follow-up with GI.  3.  Generalized anxiety -Patient states on Xanax  0.25 mg twice daily as needed. - Change Xanax  to 0.25 mg twice daily as needed.  4.  Acute renal failure -Likely secondary to prerenal azotemia. - Improving with hydration.  5.  Hyponatremia -Continue IV fluids.  6.  GERD -PPI.   DVT prophylaxis: Heparin  Code Status: Full Family Communication: Updated patient and sister at bedside. Disposition: Likely home when clinically improved and cleared by general surgery.  Status is: Inpatient Remains inpatient appropriate because: Severity of illness.   Consultants:  General Surgery: Dr. [name] 12/14/2023  Procedures:  CT abdomen and pelvis 12/14/2023 Right upper quadrant ultrasound 12/14/2023 Laparoscopic fenestrated subtotal cholecystectomy, laparoscopic lysis of adhesions per general surgery: Dr. Ebbie 12/15/2023  Antimicrobials:  Anti-infectives (From admission, onward)    Start     Dose/Rate Route Frequency Ordered Stop   12/15/23 1630  cefTRIAXone  (ROCEPHIN ) 2 g in sodium chloride  0.9 % 100 mL IVPB        2 g 200 mL/hr over 30 Minutes Intravenous Every 24 hours 12/15/23 0103     12/15/23 0600  metroNIDAZOLE  (FLAGYL ) IVPB 500 mg        500 mg 100 mL/hr over 60 Minutes Intravenous Every 12 hours 12/15/23 0103     12/14/23 1615  cefTRIAXone  (ROCEPHIN ) 2 g in sodium chloride  0.9 % 100 mL IVPB        2 g 200 mL/hr over 30  Minutes Intravenous  Once 12/14/23 1602 12/14/23 1658   12/14/23 1615  metroNIDAZOLE  (FLAGYL ) IVPB 500 mg        500 mg 100 mL/hr over 60 Minutes Intravenous  Once 12/14/23 1602 12/14/23 1910         Subjective: Patient laying in bed.   Some complaints of right upper quadrant soreness which is worse today than it was yesterday.  No nausea or emesis.  Tolerating liquid diet.  Denies any flatus.  No bowel movement.  States has been ambulating in hallway.  Sister at bedside.  Objective: Vitals:   12/16/23 1828 12/16/23 2007 12/17/23 0502 12/17/23 1341  BP: 128/86 131/86 110/68 123/72  Pulse: (!) 104 96 94 97  Resp: 18 18 18 18   Temp: 98.3 F (36.8 C) 99.5 F (37.5 C) 99.7 F (37.6 C)   TempSrc: Oral Oral Oral   SpO2: 95% 92% 95% 92%  Weight:      Height:        Intake/Output Summary (Last 24 hours) at 12/17/2023 1819 Last data filed at 12/17/2023 1800 Gross per 24 hour  Intake 3596.36 ml  Output 2530 ml  Net 1066.36 ml   Filed Weights   12/15/23 0717  Weight: 103 kg    Examination:  General exam: Appears calm and comfortable  Respiratory system: Clear to auscultation. Respiratory effort normal. Cardiovascular system: S1 & S2 heard, RRR. No JVD, murmurs, rubs, gallops or clicks. No pedal edema. Gastrointestinal system: Abdomen is nondistended, soft and tender to palpation right upper quadrant.  Right JP drain in place.  Hypoactive bowel sounds.  Central nervous system: Alert and oriented. No focal neurological deficits. Extremities: Symmetric 5 x 5 power. Skin: No rashes, lesions or ulcers Psychiatry: Judgement and insight appear normal. Mood & affect appropriate.     Data Reviewed: I have personally reviewed following labs and imaging studies  CBC: Recent Labs  Lab 12/12/23 2046 12/14/23 1347 12/15/23 0425 12/16/23 0441 12/17/23 0434  WBC 11.3* 12.8* 15.6* 13.8* 10.4  HGB 17.3* 16.3 15.9 15.3 12.8*  HCT 49.8 49.2 48.7 47.3 40.8  MCV 83.7 84.7 88.4 89.8 88.9  PLT 217 204 188 221 196    Basic Metabolic Panel: Recent Labs  Lab 12/12/23 2046 12/14/23 1347 12/15/23 0425 12/16/23 0441 12/17/23 0434  NA 133* 125* 131* 131* 132*  K 3.6 3.7 3.8 3.9 3.8  CL 95* 94* 96* 94* 96*  CO2 22 21* 23  25 25   GLUCOSE 152* 146* 104* 140* 126*  BUN 12 12 16 16 13   CREATININE 1.23 1.17 1.18 1.34* 1.27*  CALCIUM 10.1 8.7* 8.4* 8.4* 7.6*    GFR: Estimated Creatinine Clearance: 78.9 mL/min (A) (by C-G formula based on SCr of 1.27 mg/dL (H)).  Liver Function Tests: Recent Labs  Lab 12/12/23 2046 12/14/23 1347 12/15/23 0425 12/16/23 0441 12/17/23 0434  AST 46* 26 18 26 18   ALT 43 25 18 24 17   ALKPHOS 84 60 58 66 51  BILITOT 0.9 1.8* 1.6* 1.2 1.1  PROT 9.1* 8.4* 7.8 8.3* 6.6  ALBUMIN 4.6 3.6 3.1* 3.0* 2.4*    CBG: No results for input(s): GLUCAP in the last 168 hours.   Recent Results (from the past 240 hours)  MRSA Next Gen by PCR, Nasal     Status: Abnormal   Collection Time: 12/14/23  9:05 PM   Specimen: Nasal Mucosa; Nasal Swab  Result Value Ref Range Status   MRSA by PCR Next Gen DETECTED (A) NOT DETECTED Final  Comment: RESULT CALLED TO, READ BACK BY AND VERIFIED WITH: B. PAUDEA, RN [phone] 12/15/23 BY ALONSO CORDS (NOTE) The GeneXpert MRSA Assay (FDA approved for NASAL specimens only), is one component of a comprehensive MRSA colonization surveillance program. It is not intended to diagnose MRSA infection nor to guide or monitor treatment for MRSA infections. Test performance is not FDA approved in patients less than 56 years old. Performed at [facility], [address] [place], [place] [address]          Radiology Studies: No results found.      Scheduled Meds:  heparin  injection (subcutaneous)  5,000 Units Subcutaneous Q8H   mupirocin  ointment   Nasal BID   pantoprazole   40 mg Oral BID   Continuous Infusions:  sodium chloride      cefTRIAXone  (ROCEPHIN )  IV 2 g (12/17/23 1548)   metronidazole  500 mg (12/17/23 1747)     LOS: 3 days    Time spent: 40 minutes    Toribio Hummer, MD [facility]   To contact the attending provider between 7A-7P or the covering provider during after hours 7P-7A, please log into  the web site [email or web address] and access using universal [place] password for that web site. If you do not have the password, please call the hospital operator.  12/17/2023, 6:19 PM

## 2023-12-17 NOTE — Plan of Care (Signed)
  Problem: Education: Goal: Knowledge of General Education information will improve Description: Including pain rating scale, medication(s)/side effects and non-pharmacologic comfort measures Outcome: Progressing   Problem: Health Behavior/Discharge Planning: Goal: Ability to manage health-related needs will improve Outcome: Progressing   Problem: Nutrition: Goal: Adequate nutrition will be maintained Outcome: Progressing   Problem: Coping: Goal: Level of anxiety will decrease Outcome: Progressing   Problem: Elimination: Goal: Will not experience complications related to bowel motility Outcome: Progressing   Problem: Pain Managment: Goal: General experience of comfort will improve and/or be controlled Outcome: Progressing

## 2023-12-17 NOTE — Progress Notes (Signed)
 2 Days Post-Op   Subjective/Chief Complaint: No flatus yet, doing fine, walking alot   Objective: Vital signs in last 24 hours: Temp:  [98.2 F (36.8 C)-99.7 F (37.6 C)] 99.7 F (37.6 C) (08/13 0502) Pulse Rate:  [94-110] 94 (08/13 0502) Resp:  [16-18] 18 (08/13 0502) BP: (110-131)/(68-86) 110/68 (08/13 0502) SpO2:  [92 %-95 %] 95 % (08/13 0502) Last BM Date : 12/12/23  Intake/Output from previous day: 08/12 0701 - 08/13 0700 In: 3093.5 [P.O.:1620; I.V.:1222.5; IV Piggyback:251] Out: 2070 [Urine:1700; Drains:370] Intake/Output this shift: No intake/output data recorded.  Ab mild distended drain with old blood mostly approp tender incisions clean  Lab Results:  Recent Labs    12/16/23 0441 12/17/23 0434  WBC 13.8* 10.4  HGB 15.3 12.8*  HCT 47.3 40.8  PLT 221 196   BMET Recent Labs    12/16/23 0441 12/17/23 0434  NA 131* 132*  K 3.9 3.8  CL 94* 96*  CO2 25 25  GLUCOSE 140* 126*  BUN 16 13  CREATININE 1.34* 1.27*  CALCIUM 8.4* 7.6*   PT/INR No results for input(s): LABPROT, INR in the last 72 hours. ABG No results for input(s): PHART, HCO3 in the last 72 hours.  Invalid input(s): PCO2, PO2  Studies/Results: No results found.  Anti-infectives: Anti-infectives (From admission, onward)    Start     Dose/Rate Route Frequency Ordered Stop   12/15/23 1630  cefTRIAXone  (ROCEPHIN ) 2 g in sodium chloride  0.9 % 100 mL IVPB        2 g 200 mL/hr over 30 Minutes Intravenous Every 24 hours 12/15/23 0103     12/15/23 0600  metroNIDAZOLE  (FLAGYL ) IVPB 500 mg        500 mg 100 mL/hr over 60 Minutes Intravenous Every 12 hours 12/15/23 0103     12/14/23 1615  cefTRIAXone  (ROCEPHIN ) 2 g in sodium chloride  0.9 % 100 mL IVPB        2 g 200 mL/hr over 30 Minutes Intravenous  Once 12/14/23 1602 12/14/23 1658   12/14/23 1615  metroNIDAZOLE  (FLAGYL ) IVPB 500 mg        500 mg 100 mL/hr over 60 Minutes Intravenous  Once 12/14/23 1602 12/14/23 1910        Assessment/Plan: POD 2S/P Procedure laparoscopic fenestrated subtotal cholecystectomy by Dr. Donnice Bury on 12/15/2023 -tachycardia resolved -Cr better, reasonable to continue iv fluids for now -WBC normal, hb lower as expected with hydrdation -will not advance diet until flatus  -monitor drain -Discussed post-operative restrictions. Will provide letter for work. Will arrange outpatient follow up. -Will continue to follow     FEN: fulls VTE:  sub Q hep ID: Ceftriaxone  and metronidazole  for 5 days Donnice Bury 12/17/2023

## 2023-12-18 ENCOUNTER — Inpatient Hospital Stay (HOSPITAL_COMMUNITY)

## 2023-12-18 DIAGNOSIS — K810 Acute cholecystitis: Secondary | ICD-10-CM | POA: Diagnosis not present

## 2023-12-18 DIAGNOSIS — K567 Ileus, unspecified: Secondary | ICD-10-CM

## 2023-12-18 DIAGNOSIS — K50819 Crohn's disease of both small and large intestine with unspecified complications: Secondary | ICD-10-CM | POA: Diagnosis not present

## 2023-12-18 DIAGNOSIS — E871 Hypo-osmolality and hyponatremia: Secondary | ICD-10-CM | POA: Diagnosis not present

## 2023-12-18 DIAGNOSIS — F411 Generalized anxiety disorder: Secondary | ICD-10-CM | POA: Diagnosis not present

## 2023-12-18 DIAGNOSIS — K9189 Other postprocedural complications and disorders of digestive system: Secondary | ICD-10-CM | POA: Insufficient documentation

## 2023-12-18 LAB — CBC
HCT: 40.3 % (ref 39.0–52.0)
Hemoglobin: 12.6 g/dL — ABNORMAL LOW (ref 13.0–17.0)
MCH: 28.1 pg (ref 26.0–34.0)
MCHC: 31.3 g/dL (ref 30.0–36.0)
MCV: 89.8 fL (ref 80.0–100.0)
Platelets: 220 K/uL (ref 150–400)
RBC: 4.49 MIL/uL (ref 4.22–5.81)
RDW: 13.8 % (ref 11.5–15.5)
WBC: 9.8 K/uL (ref 4.0–10.5)
nRBC: 0 % (ref 0.0–0.2)

## 2023-12-18 LAB — COMPREHENSIVE METABOLIC PANEL WITH GFR
ALT: 14 U/L (ref 0–44)
AST: 18 U/L (ref 15–41)
Albumin: 2.2 g/dL — ABNORMAL LOW (ref 3.5–5.0)
Alkaline Phosphatase: 53 U/L (ref 38–126)
Anion gap: 7 (ref 5–15)
BUN: 8 mg/dL (ref 6–20)
CO2: 27 mmol/L (ref 22–32)
Calcium: 7.4 mg/dL — ABNORMAL LOW (ref 8.9–10.3)
Chloride: 96 mmol/L — ABNORMAL LOW (ref 98–111)
Creatinine, Ser: 1.18 mg/dL (ref 0.61–1.24)
GFR, Estimated: >60 mL/min (ref >60)
Glucose, Bld: 121 mg/dL — ABNORMAL HIGH (ref 70–99)
Potassium: 3.7 mmol/L (ref 3.5–5.1)
Sodium: 130 mmol/L — ABNORMAL LOW (ref 135–145)
Total Bilirubin: 1.2 mg/dL (ref 0.0–1.2)
Total Protein: 6.6 g/dL (ref 6.5–8.1)

## 2023-12-18 LAB — MAGNESIUM: Magnesium: 2 mg/dL (ref 1.7–2.4)

## 2023-12-18 MED ORDER — ALPRAZOLAM 0.25 MG PO TABS
0.2500 mg | ORAL_TABLET | Freq: Once | ORAL | Status: AC
  Administered 2023-12-18: 0.25 mg via ORAL
  Filled 2023-12-18: qty 1

## 2023-12-18 MED ORDER — ONDANSETRON HCL 4 MG/2ML IJ SOLN
4.0000 mg | Freq: Once | INTRAMUSCULAR | Status: AC
  Administered 2023-12-18: 4 mg via INTRAVENOUS
  Filled 2023-12-18: qty 2

## 2023-12-18 MED ORDER — POTASSIUM CHLORIDE 10 MEQ/100ML IV SOLN
10.0000 meq | INTRAVENOUS | Status: AC
  Administered 2023-12-18 – 2023-12-19 (×4): 10 meq via INTRAVENOUS
  Filled 2023-12-18 (×4): qty 100

## 2023-12-18 MED ORDER — LORAZEPAM 2 MG/ML IJ SOLN: 0.2500 mg | Freq: Two times a day (BID) | INTRAMUSCULAR | Status: DC | PRN

## 2023-12-18 MED ORDER — PANTOPRAZOLE SODIUM 40 MG IV SOLR
40.0000 mg | Freq: Two times a day (BID) | INTRAVENOUS | Status: DC
  Administered 2023-12-18 – 2023-12-21 (×7): 40 mg via INTRAVENOUS
  Filled 2023-12-18 (×7): qty 10

## 2023-12-18 MED ORDER — SODIUM CHLORIDE 0.9 % IV SOLN: INTRAVENOUS | Status: AC

## 2023-12-18 MED ORDER — ONDANSETRON HCL 4 MG/2ML IJ SOLN: 4.0000 mg | Freq: Four times a day (QID) | INTRAMUSCULAR | Status: DC | PRN

## 2023-12-18 MED ORDER — ACETAMINOPHEN 325 MG PO TABS
650.0000 mg | ORAL_TABLET | ORAL | Status: DC | PRN
  Administered 2023-12-20 – 2023-12-23 (×10): 650 mg via ORAL
  Filled 2023-12-18 (×10): qty 2

## 2023-12-18 MED ORDER — ACETAMINOPHEN 10 MG/ML IV SOLN
1000.0000 mg | Freq: Four times a day (QID) | INTRAVENOUS | Status: AC
  Administered 2023-12-18 – 2023-12-19 (×4): 1000 mg via INTRAVENOUS
  Filled 2023-12-18 (×4): qty 100

## 2023-12-18 NOTE — Progress Notes (Signed)
 Progress Note  3 Days Post-Op  Subjective: Reports abdominal soreness that is manageable. No bowel movement or flatus yet. Tolerating FLD. Had some nausea this morning that has dissipated. Denies vomiting.  ROS  All negative with the exception of above.  Objective: Vital signs in last 24 hours: Temp:  [98.6 F (37 C)] 98.6 F (37 C) (08/14 0556) Pulse Rate:  [92-97] 92 (08/14 0556) Resp:  [16-18] 17 (08/14 0556) BP: (123-133)/(72-88) 133/88 (08/14 0556) SpO2:  [90 %-92 %] 90 % (08/14 0556) Last BM Date : 12/12/23  Intake/Output from previous day: 08/13 0701 - 08/14 0700 In: 3475.2 [P.O.:1660; I.V.:1547.3; IV Piggyback:267.9] Out: 1800 [Urine:1400; Drains:400] Intake/Output this shift: No intake/output data recorded.  PE: General: Pleasant male who is laying in bed in NAD. Lungs: Respiratory effort nonlabored Abd: Soft with distention. Generalized tenderness to palpation. Incisions C/D/I. Drain present of right abdomen with brown thin fluid in bulb.  Psych: A&Ox3 with an appropriate affect.    Lab Results:  Recent Labs    12/17/23 0434 12/18/23 0427  WBC 10.4 9.8  HGB 12.8* 12.6*  HCT 40.8 40.3  PLT 196 220   BMET Recent Labs    12/17/23 0434 12/18/23 0427  NA 132* 130*  K 3.8 3.7  CL 96* 96*  CO2 25 27  GLUCOSE 126* 121*  BUN 13 8  CREATININE 1.27* 1.18  CALCIUM 7.6* 7.4*   PT/INR No results for input(s): LABPROT, INR in the last 72 hours. CMP     Component Value Date/Time   NA 130 (L) 12/18/2023 0427   K 3.7 12/18/2023 0427   CL 96 (L) 12/18/2023 0427   CO2 27 12/18/2023 0427   GLUCOSE 121 (H) 12/18/2023 0427   BUN 8 12/18/2023 0427   CREATININE 1.18 12/18/2023 0427   CALCIUM 7.4 (L) 12/18/2023 0427   PROT 6.6 12/18/2023 0427   ALBUMIN 2.2 (L) 12/18/2023 0427   AST 18 12/18/2023 0427   ALT 14 12/18/2023 0427   ALKPHOS 53 12/18/2023 0427   BILITOT 1.2 12/18/2023 0427   GFRNONAA >60 12/18/2023 0427   GFRAA >90 09/19/2013 0450    Lipase     Component Value Date/Time   LIPASE 29 12/14/2023 1347       Studies/Results: No results found.  Anti-infectives: Anti-infectives (From admission, onward)    Start     Dose/Rate Route Frequency Ordered Stop   12/15/23 1630  cefTRIAXone  (ROCEPHIN ) 2 g in sodium chloride  0.9 % 100 mL IVPB        2 g 200 mL/hr over 30 Minutes Intravenous Every 24 hours 12/15/23 0103     12/15/23 0600  metroNIDAZOLE  (FLAGYL ) IVPB 500 mg        500 mg 100 mL/hr over 60 Minutes Intravenous Every 12 hours 12/15/23 0103     12/14/23 1615  cefTRIAXone  (ROCEPHIN ) 2 g in sodium chloride  0.9 % 100 mL IVPB        2 g 200 mL/hr over 30 Minutes Intravenous  Once 12/14/23 1602 12/14/23 1658   12/14/23 1615  metroNIDAZOLE  (FLAGYL ) IVPB 500 mg        500 mg 100 mL/hr over 60 Minutes Intravenous  Once 12/14/23 1602 12/14/23 1910        Assessment/Plan POD3: S/P Procedure laparoscopic fenestrated subtotal cholecystectomy by Dr. Donnice Bury on 12/15/2023 -Vitals stable. Afebrile -Cr 1.18 -WBC 9.8. Hemoglobin 12.6 -Tolerated FLD. No BM/flatulence yet.  -Drain currently with 25. Total 400 from 8/13-8/14. -Will continue to follow; Patient aware  of post-operative restrictions. Work letter to be provided and outpatient follow up to be arranged.     FEN: FLD VTE: Heparin  injections ID: Ceftriaxone  and metronidazole     LOS: 4 days   I reviewed nursing notes, hospitalist notes, last 24 h vitals and pain scores, last 48 h intake and output, last 24 h labs and trends, and last 24 h imaging results.   Marjorie Carlyon Favre, [facility] 12/18/2023, 7:58 AM Please see Amion for pager number during day hours 7:00am-4:30pm

## 2023-12-18 NOTE — Progress Notes (Addendum)
 PROGRESS NOTE    Greg [name]  FMW:[id number] DOB: July 20, 1965 DOA: 12/14/2023 PCP: Greg Elsie [name], MD    Chief Complaint  Patient presents with   Abdominal Pain    Brief Narrative:  58 y.o. male with medical history significant of Chrons disease who presents back to our facility after worsening abdominal pain, right upper quadrant.  Admitted for acute cholecystitis-General Surgery consulted and patient underwent laparoscopic cholecystectomy 12/15/2023.     Assessment & Plan:   Principal Problem:   Acute cholecystitis Active Problems:   Crohn's disease, question perirectal involvement   Gastroesophageal reflux disease   Generalized anxiety disorder   Insomnia   Venous insufficiency of lower extremity   Immunosuppression due to drug therapy (Humira  for Crohns disease)   Hyponatremia   Postoperative ileus (HCC)  #1 acute cholecystitis, POA -Patient noted to have presented with worsening abdominal pain, right upper quadrant. -Right upper quadrant ultrasound done with stones in the region of gallbladder neck/cystic duct with gallbladder wall thickening and pericholecystic fluid.  Negative Murphy sign.  Hepatic steatosis. - CT abdomen and pelvis done with significant change in gallbladder when compared with prior exam.  Gallstones in the region of gallbladder neck with wall thickening and pericholecystic fluid highly suspicious for acute cholecystitis in appropriate clinical setting.  Fatty liver.  GERD.  Persistent soft tissue swelling in the region of the mons pubis stable from prior exam. - Patient seen in consultation by general surgery and patient underwent laparoscopic fenestrated subtotal cholecystectomy, laparoscopic lysis of adhesions on 12/15/2023. - Patient noted to be tolerating full liquids.   - Patient noted to have abdominal distention, some nausea this morning with concerns for postop ileus and as such patient made NPO.  -Abdominal films obtained this morning  with findings suggestive of postop ileus, surgical drain right upper quadrant noted.  -Keep potassium approximately 4, magnesium  approximately 2. - Continue IV fluids. -Continue empiric IV Flagyl  and IV Rocephin . -Mobilize. -While patient does not meet sepsis criteria, and he has no fever, minimally elevated leukocytosis of 12.8 he is on Humira  and given his immunocompromise state may prolong his antibiotic course pending his surgery and postop course.  - Per general surgery.  2.  Postoperative ileus -Patient noted with abdominal distention, nausea, hypoactive bowel sounds. - Abdominal films ordered this morning consistent with postoperative ileus. - Replete electrolytes to keep potassium approximately 4, magnesium  approximately 2. - Mobilize. - Serial abdominal films.  3.  Crohn's disease without acute flare -Stable. - Patient on Humira  not due until 12/26/2023. - Outpatient follow-up with GI.  4.  Generalized anxiety - Change Xanax  to IV Ativan  as needed.   5.  Acute renal failure -Likely secondary to prerenal azotemia. - Improved with hydration.   6.  Hyponatremia - IV fluids.   7.  GERD - Change PPI to IV PPI.      DVT prophylaxis: Heparin  Code Status: Full Family Communication: Updated patient and sister at bedside. Disposition: Likely home when clinically improved and cleared by general surgery.  Status is: Inpatient Remains inpatient appropriate because: Severity of illness.   Consultants:  General Surgery: Dr. [name] 12/14/2023  Procedures:  CT abdomen and pelvis 12/14/2023 Right upper quadrant ultrasound 12/14/2023 Laparoscopic fenestrated subtotal cholecystectomy, laparoscopic lysis of adhesions per general surgery: Dr. Ebbie 12/15/2023 Abdominal films 12/18/2023  Antimicrobials:  Anti-infectives (From admission, onward)    Start     Dose/Rate Route Frequency Ordered Stop   12/15/23 1630  cefTRIAXone  (ROCEPHIN ) 2 g in sodium chloride   0.9 % 100 mL IVPB         2 g 200 mL/hr over 30 Minutes Intravenous Every 24 hours 12/15/23 0103     12/15/23 0600  metroNIDAZOLE  (FLAGYL ) IVPB 500 mg        500 mg 100 mL/hr over 60 Minutes Intravenous Every 12 hours 12/15/23 0103     12/14/23 1615  cefTRIAXone  (ROCEPHIN ) 2 g in sodium chloride  0.9 % 100 mL IVPB        2 g 200 mL/hr over 30 Minutes Intravenous  Once 12/14/23 1602 12/14/23 1658   12/14/23 1615  metroNIDAZOLE  (FLAGYL ) IVPB 500 mg        500 mg 100 mL/hr over 60 Minutes Intravenous  Once 12/14/23 1602 12/14/23 1910         Subjective: Patient sitting up in chair.  Denies any chest pain, no shortness of breath.  Some complaints of abdominal discomfort earlier on this morning which has since improved.  Patient with abdominal distention.  Patient stated had a bout of nausea drank some cranberry juice and nausea has since resolved.  Denies any emesis.  No flatus.  No bowel movement.  Objective: Vitals:   12/17/23 1341 12/17/23 2104 12/18/23 0556 12/18/23 1401  BP: 123/72 129/77 133/88 (!) 150/100  Pulse: 97 92 92 93  Resp: 18 16 17 16   Temp:  98.6 F (37 C) 98.6 F (37 C) 98.4 F (36.9 C)  TempSrc:  Oral Oral Oral  SpO2: 92% 91% 90% 92%  Weight:      Height:        Intake/Output Summary (Last 24 hours) at 12/18/2023 1614 Last data filed at 12/18/2023 1402 Gross per 24 hour  Intake 3372.27 ml  Output 1330 ml  Net 2042.27 ml   Filed Weights   12/15/23 0717  Weight: 103 kg    Examination:  General exam: NAD. Respiratory system: Lungs clear to auscultation bilaterally.  No wheezes, no crackles, no rhonchi.  Normal respiratory effort.  Cardiovascular system: RRR no murmurs rubs or gallops.  No JVD.  No lower extremity edema.  Gastrointestinal system: Abdomen is distended, soft, some tenderness to palpation right upper quadrant.  Right JP drain in place with sanguinous drainage.  Hypoactive bowel sounds.   Central nervous system: Alert and oriented. No focal neurological  deficits. Extremities: Symmetric 5 x 5 power. Skin: No rashes, lesions or ulcers Psychiatry: Judgement and insight appear normal. Mood & affect appropriate.     Data Reviewed: I have personally reviewed following labs and imaging studies  CBC: Recent Labs  Lab 12/14/23 1347 12/15/23 0425 12/16/23 0441 12/17/23 0434 12/18/23 0427  WBC 12.8* 15.6* 13.8* 10.4 9.8  HGB 16.3 15.9 15.3 12.8* 12.6*  HCT 49.2 48.7 47.3 40.8 40.3  MCV 84.7 88.4 89.8 88.9 89.8  PLT 204 188 221 196 220    Basic Metabolic Panel: Recent Labs  Lab 12/14/23 1347 12/15/23 0425 12/16/23 0441 12/17/23 0434 12/18/23 0427  NA 125* 131* 131* 132* 130*  K 3.7 3.8 3.9 3.8 3.7  CL 94* 96* 94* 96* 96*  CO2 21* 23 25 25 27   GLUCOSE 146* 104* 140* 126* 121*  BUN 12 16 16 13 8   CREATININE 1.17 1.18 1.34* 1.27* 1.18  CALCIUM 8.7* 8.4* 8.4* 7.6* 7.4*  MG  --   --   --   --  2.0    GFR: Estimated Creatinine Clearance: 84.9 mL/min (by C-G formula based on SCr of 1.18 mg/dL).  Liver Function Tests: Recent  Labs  Lab 12/14/23 1347 12/15/23 0425 12/16/23 0441 12/17/23 0434 12/18/23 0427  AST 26 18 26 18 18   ALT 25 18 24 17 14   ALKPHOS 60 58 66 51 53  BILITOT 1.8* 1.6* 1.2 1.1 1.2  PROT 8.4* 7.8 8.3* 6.6 6.6  ALBUMIN 3.6 3.1* 3.0* 2.4* 2.2*    CBG: No results for input(s): GLUCAP in the last 168 hours.   Recent Results (from the past 240 hours)  MRSA Next Gen by PCR, Nasal     Status: Abnormal   Collection Time: 12/14/23  9:05 PM   Specimen: Nasal Mucosa; Nasal Swab  Result Value Ref Range Status   MRSA by PCR Next Gen DETECTED (A) NOT DETECTED Final    Comment: RESULT CALLED TO, READ BACK BY AND VERIFIED WITH: B. PAUDEA, RN [phone] 12/15/23 BY ALONSO CORDS (NOTE) The GeneXpert MRSA Assay (FDA approved for NASAL specimens only), is one component of a comprehensive MRSA colonization surveillance program. It is not intended to diagnose MRSA infection nor to guide or monitor treatment for MRSA  infections. Test performance is not FDA approved in patients less than 17 years old. Performed at [facility], [address] [place], [place] [address]          Radiology Studies: DG Abd 2 Views Result Date: 12/18/2023 CLINICAL DATA:  Abdominal distension EXAM: ABDOMEN - 2 VIEW COMPARISON:  None Available. FINDINGS: Surgical drain in the RIGHT upper quadrant. Two gallstones noted the RIGHT upper quadrant. Mildly dilated loops of small bowel. No dynamic air-fluid levels. Gas in the LEFT colon. No gas the rectum. IMPRESSION: 1. Findings most suggestive of postoperative ileus. 2. Surgical drain RIGHT upper quadrant Electronically Signed   By: Jackquline Boxer M.D.   On: 12/18/2023 11:03        Scheduled Meds:  heparin  injection (subcutaneous)  5,000 Units Subcutaneous Q8H   mupirocin  ointment   Nasal BID   pantoprazole  (PROTONIX ) IV  40 mg Intravenous Q12H   Continuous Infusions:  sodium chloride  125 mL/hr at 12/18/23 1227   acetaminophen  1,000 mg (12/18/23 1402)   cefTRIAXone  (ROCEPHIN )  IV 2 g (12/18/23 1601)   metronidazole  500 mg (12/18/23 0648)     LOS: 4 days    Time spent: 40 minutes    Toribio Hummer, MD [facility]   To contact the attending provider between 7A-7P or the covering provider during after hours 7P-7A, please log into the web site [email or web address] and access using universal [place] password for that web site. If you do not have the password, please call the hospital operator.  12/18/2023, 4:14 PM

## 2023-12-18 NOTE — Plan of Care (Signed)
  Problem: Health Behavior/Discharge Planning: Goal: Ability to manage health-related needs will improve Outcome: Progressing   Problem: Clinical Measurements: Goal: Ability to maintain clinical measurements within normal limits will improve Outcome: Progressing Goal: Will remain free from infection Outcome: Progressing Goal: Diagnostic test results will improve Outcome: Progressing   Problem: Nutrition: Goal: Adequate nutrition will be maintained Outcome: Progressing   Problem: Pain Managment: Goal: General experience of comfort will improve and/or be controlled Outcome: Progressing

## 2023-12-18 NOTE — Progress Notes (Signed)
 Patient complaining of cramping, discomfort and gaseous pain. Patient ambulated overnight, given PRN oxy @ 0015, then IV Dilaudid  @ approx. 0400.   At 6AM, patient c/o nausea and was dry heaving. Night NP ordered once dose of Zofran .

## 2023-12-18 NOTE — Progress Notes (Signed)
 Called to see patient for chills, despite normal temp, HR 113, BP 150/100, but no N/V.  He is not having any pain.  His chills have resolved as well as his HR.  His drain however is now bilious.  Had a length discussion with patient and his family that was present regarding treatment of a bile leak, high output vs low output.  The patient is stable at this time and is not ill appearing.  If he becomes ill-appearing, he will need a CT scan to make sure this bile is well controlled with his current drain.  Patient and family understand. May have gum and hard candy.  Burnard FORBES Banter 3:32 PM 12/18/2023

## 2023-12-19 ENCOUNTER — Inpatient Hospital Stay (HOSPITAL_COMMUNITY)

## 2023-12-19 ENCOUNTER — Encounter (HOSPITAL_COMMUNITY): Payer: Self-pay | Admitting: Internal Medicine

## 2023-12-19 DIAGNOSIS — K810 Acute cholecystitis: Secondary | ICD-10-CM | POA: Diagnosis not present

## 2023-12-19 DIAGNOSIS — E871 Hypo-osmolality and hyponatremia: Secondary | ICD-10-CM | POA: Diagnosis not present

## 2023-12-19 DIAGNOSIS — F411 Generalized anxiety disorder: Secondary | ICD-10-CM | POA: Diagnosis not present

## 2023-12-19 DIAGNOSIS — K50819 Crohn's disease of both small and large intestine with unspecified complications: Secondary | ICD-10-CM | POA: Diagnosis not present

## 2023-12-19 LAB — CBC
HCT: 40.9 % (ref 39.0–52.0)
Hemoglobin: 12.8 g/dL — ABNORMAL LOW (ref 13.0–17.0)
MCH: 28.6 pg (ref 26.0–34.0)
MCHC: 31.3 g/dL (ref 30.0–36.0)
MCV: 91.3 fL (ref 80.0–100.0)
Platelets: 215 K/uL (ref 150–400)
RBC: 4.48 MIL/uL (ref 4.22–5.81)
RDW: 13.6 % (ref 11.5–15.5)
WBC: 8.9 K/uL (ref 4.0–10.5)
nRBC: 0 % (ref 0.0–0.2)

## 2023-12-19 LAB — COMPREHENSIVE METABOLIC PANEL WITH GFR
ALT: 11 U/L (ref 0–44)
AST: 17 U/L (ref 15–41)
Albumin: 1.9 g/dL — ABNORMAL LOW (ref 3.5–5.0)
Alkaline Phosphatase: 60 U/L (ref 38–126)
Anion gap: 10 (ref 5–15)
BUN: 7 mg/dL (ref 6–20)
CO2: 23 mmol/L (ref 22–32)
Calcium: 7.3 mg/dL — ABNORMAL LOW (ref 8.9–10.3)
Chloride: 99 mmol/L (ref 98–111)
Creatinine, Ser: 0.94 mg/dL (ref 0.61–1.24)
GFR, Estimated: >60 mL/min (ref >60)
Glucose, Bld: 126 mg/dL — ABNORMAL HIGH (ref 70–99)
Potassium: 3.6 mmol/L (ref 3.5–5.1)
Sodium: 132 mmol/L — ABNORMAL LOW (ref 135–145)
Total Bilirubin: 0.8 mg/dL (ref 0.0–1.2)
Total Protein: 5.6 g/dL — ABNORMAL LOW (ref 6.5–8.1)

## 2023-12-19 LAB — MAGNESIUM: Magnesium: 1.9 mg/dL (ref 1.7–2.4)

## 2023-12-19 MED ORDER — HYDROMORPHONE HCL 1 MG/ML IJ SOLN
1.0000 mg | INTRAMUSCULAR | Status: DC | PRN
  Administered 2023-12-19 – 2023-12-22 (×4): 1 mg via INTRAVENOUS
  Filled 2023-12-19 (×2): qty 1
  Filled 2023-12-19: qty 2
  Filled 2023-12-19 (×2): qty 1

## 2023-12-19 MED ORDER — ALPRAZOLAM 0.25 MG PO TABS: 0.2500 mg | ORAL_TABLET | Freq: Two times a day (BID) | ORAL | Status: DC | PRN

## 2023-12-19 MED ORDER — SODIUM CHLORIDE 0.9 % IV SOLN: INTRAVENOUS | Status: AC

## 2023-12-19 MED ORDER — IOHEXOL 300 MG/ML  SOLN
100.0000 mL | Freq: Once | INTRAMUSCULAR | Status: AC | PRN
  Administered 2023-12-19: 100 mL via INTRAVENOUS

## 2023-12-19 MED ORDER — HYDROMORPHONE HCL 1 MG/ML IJ SOLN
0.5000 mg | Freq: Once | INTRAMUSCULAR | Status: AC
  Administered 2023-12-19: 0.5 mg via INTRAVENOUS
  Filled 2023-12-19: qty 0.5

## 2023-12-19 MED ORDER — DIAZEPAM 5 MG/ML IJ SOLN
2.5000 mg | Freq: Three times a day (TID) | INTRAMUSCULAR | Status: DC | PRN
  Filled 2023-12-19: qty 2

## 2023-12-19 MED ORDER — POTASSIUM CHLORIDE 10 MEQ/100ML IV SOLN
10.0000 meq | INTRAVENOUS | Status: AC
  Administered 2023-12-19 (×5): 10 meq via INTRAVENOUS
  Filled 2023-12-19 (×5): qty 100

## 2023-12-19 MED ORDER — MAGNESIUM SULFATE 2 GM/50ML IV SOLN
2.0000 g | Freq: Once | INTRAVENOUS | Status: AC
  Administered 2023-12-19: 2 g via INTRAVENOUS
  Filled 2023-12-19: qty 50

## 2023-12-19 MED ORDER — MIDAZOLAM HCL 2 MG/2ML IJ SOLN
0.5000 mg | Freq: Two times a day (BID) | INTRAMUSCULAR | Status: DC | PRN
  Administered 2023-12-19: 0.5 mg via INTRAVENOUS
  Filled 2023-12-19: qty 2

## 2023-12-19 MED ORDER — OXYCODONE HCL 5 MG PO TABS
5.0000 mg | ORAL_TABLET | ORAL | Status: DC | PRN
  Administered 2023-12-21 (×2): 5 mg via ORAL
  Filled 2023-12-19: qty 2
  Filled 2023-12-19: qty 1
  Filled 2023-12-19: qty 2

## 2023-12-19 NOTE — Plan of Care (Signed)
  Problem: Health Behavior/Discharge Planning: Goal: Ability to manage health-related needs will improve Outcome: Progressing   Problem: Clinical Measurements: Goal: Diagnostic test results will improve Outcome: Progressing   Problem: Activity: Goal: Risk for activity intolerance will decrease Outcome: Progressing   Problem: Coping: Goal: Level of anxiety will decrease Outcome: Progressing   Problem: Elimination: Goal: Will not experience complications related to bowel motility Outcome: Progressing   Problem: Pain Managment: Goal: General experience of comfort will improve and/or be controlled Outcome: Progressing

## 2023-12-19 NOTE — Progress Notes (Signed)
 PROGRESS NOTE    Greg [name]  FMW:[id number] DOB: 03-16-1966 DOA: 12/14/2023 PCP: Loreli Elsie [name], MD    Chief Complaint  Patient presents with   Abdominal Pain    Brief Narrative:  58 y.o. male with medical history significant of Chrons disease who presents back to our facility after worsening abdominal pain, right upper quadrant.  Admitted for acute cholecystitis-General Surgery consulted and patient underwent laparoscopic cholecystectomy 12/15/2023.     Assessment & Plan:   Principal Problem:   Acute cholecystitis Active Problems:   Crohn's disease, question perirectal involvement   Gastroesophageal reflux disease   Generalized anxiety disorder   Insomnia   Venous insufficiency of lower extremity   Immunosuppression due to drug therapy (Humira  for Crohns disease)   Hyponatremia   Postoperative ileus (HCC)  #1 acute cholecystitis, POA -Patient noted to have presented with worsening abdominal pain, right upper quadrant. -Right upper quadrant ultrasound done with stones in the region of gallbladder neck/cystic duct with gallbladder wall thickening and pericholecystic fluid.  Negative Murphy sign.  Hepatic steatosis. - CT abdomen and pelvis done with significant change in gallbladder when compared with prior exam.  Gallstones in the region of gallbladder neck with wall thickening and pericholecystic fluid highly suspicious for acute cholecystitis in appropriate clinical setting.  Fatty liver.  GERD.  Persistent soft tissue swelling in the region of the mons pubis stable from prior exam. - Patient seen in consultation by general surgery and patient underwent laparoscopic fenestrated subtotal cholecystectomy, laparoscopic lysis of adhesions on 12/15/2023. - Patient noted to be tolerating full liquids.   - Patient noted to have abdominal distention, some nausea this morning with concerns for postop ileus and as such patient made NPO.  -Abdominal films obtained with findings  suggestive of postop ileus, surgical drain right upper quadrant noted.  -Also concern for possible bile leak. -CT abdomen and pelvis pending -Keep potassium approximately 4, magnesium  approximately 2. - Continue IV fluids. -Continue empiric IV Flagyl  and IV Rocephin . -Mobilize. -While patient does not meet sepsis criteria, and he has no fever, minimally elevated leukocytosis of 12.8 he is on Humira  and given his immunocompromise state may prolong his antibiotic course pending his surgery and postop course.  - Per general surgery.  2.  Postoperative ileus -Patient noted with abdominal distention, nausea, hypoactive bowel sounds. - Abdominal films ordered consistent with postoperative ileus. -Patient still with significant abdominal pain. -CT abdomen and pelvis pending. - Replete electrolytes to keep potassium approximately 4, magnesium  approximately 2. - Mobilize. - Serial abdominal films. - Pain management per general surgery.  3.  Crohn's disease without acute flare -Stable. - Patient on Humira  not due until 12/26/2023. - Outpatient follow-up with GI.  4.  Generalized anxiety - Patient currently with a postop ileus and SI Xanax  was discontinued and patient placed on IV Versed .   - Patient however states IV Versed  was too short acting and would prefer IV Ativan .   - Current shortage of IV Ativan  in the hospital and as such we will place on as needed IV Valium .  5.  Acute renal failure -Likely secondary to prerenal azotemia. - Improved with hydration.   6.  Hyponatremia - Continue hydration with IV fluids.   7.  GERD - Continue IV PPI.      DVT prophylaxis: Heparin  Code Status: Full Family Communication: Updated patient and sister at bedside. Disposition: Likely home when clinically improved and cleared by general surgery.  Status is: Inpatient Remains inpatient appropriate because: Severity  of illness.   Consultants:  General Surgery: Dr. [name]  12/14/2023  Procedures:  CT abdomen and pelvis 12/14/2023, 12/19/2023 Right upper quadrant ultrasound 12/14/2023 Laparoscopic fenestrated subtotal cholecystectomy, laparoscopic lysis of adhesions per general surgery: Dr. Ebbie 12/15/2023 Abdominal films 12/18/2023  Antimicrobials:  Anti-infectives (From admission, onward)    Start     Dose/Rate Route Frequency Ordered Stop   12/15/23 1630  cefTRIAXone  (ROCEPHIN ) 2 g in sodium chloride  0.9 % 100 mL IVPB        2 g 200 mL/hr over 30 Minutes Intravenous Every 24 hours 12/15/23 0103     12/15/23 0600  metroNIDAZOLE  (FLAGYL ) IVPB 500 mg        500 mg 100 mL/hr over 60 Minutes Intravenous Every 12 hours 12/15/23 0103     12/14/23 1615  cefTRIAXone  (ROCEPHIN ) 2 g in sodium chloride  0.9 % 100 mL IVPB        2 g 200 mL/hr over 30 Minutes Intravenous  Once 12/14/23 1602 12/14/23 1658   12/14/23 1615  metroNIDAZOLE  (FLAGYL ) IVPB 500 mg        500 mg 100 mL/hr over 60 Minutes Intravenous  Once 12/14/23 1602 12/14/23 1910         Subjective: Patient laying in bed with significant complaints of abdominal pain.  Patient also with complaints of anxiety states IV Versed  did not help much this morning.  No bowel movement.  No flatus.  Awaiting CT abdomen and pelvis.  Sister at bedside and concerned about patient's abdominal pain.    Objective: Vitals:   12/18/23 1816 12/18/23 2128 12/19/23 0150 12/19/23 0523  BP: 132/81 123/79  124/83  Pulse: 84 85  72  Resp: 16 19  16   Temp: 98 F (36.7 C) 98.4 F (36.9 C) 99.1 F (37.3 C) 98.5 F (36.9 C)  TempSrc:  Oral  Oral  SpO2: 94% 92%  94%  Weight:      Height:        Intake/Output Summary (Last 24 hours) at 12/19/2023 1250 Last data filed at 12/19/2023 1227 Gross per 24 hour  Intake 3008.85 ml  Output 3625 ml  Net -616.15 ml   Filed Weights   12/15/23 0717  Weight: 103 kg    Examination:  General exam: NAD. Respiratory system: CTAB.  No wheezes, no crackles, no rhonchi.  Normal  respiratory effort.  Fair air movement.  Cardiovascular system: Regular rate rhythm no murmurs rubs or gallops.  No JVD.  No lower extremity edema.   Gastrointestinal system: Abdomen is soft, distended, some tenderness to palpation in the left lower quadrant and right upper quadrant.  Right JP drain in place with bilious drainage.  Hypoactive bowel sounds.    Central nervous system: Alert and oriented. No focal neurological deficits. Extremities: Symmetric 5 x 5 power. Skin: No rashes, lesions or ulcers Psychiatry: Judgement and insight appear normal. Mood & affect appropriate.     Data Reviewed: I have personally reviewed following labs and imaging studies  CBC: Recent Labs  Lab 12/15/23 0425 12/16/23 0441 12/17/23 0434 12/18/23 0427 12/19/23 0423  WBC 15.6* 13.8* 10.4 9.8 8.9  HGB 15.9 15.3 12.8* 12.6* 12.8*  HCT 48.7 47.3 40.8 40.3 40.9  MCV 88.4 89.8 88.9 89.8 91.3  PLT 188 221 196 220 215    Basic Metabolic Panel: Recent Labs  Lab 12/15/23 0425 12/16/23 0441 12/17/23 0434 12/18/23 0427 12/19/23 0423  NA 131* 131* 132* 130* 132*  K 3.8 3.9 3.8 3.7 3.6  CL 96* 94*  96* 96* 99  CO2 23 25 25 27 23   GLUCOSE 104* 140* 126* 121* 126*  BUN 16 16 13 8 7   CREATININE 1.18 1.34* 1.27* 1.18 0.94  CALCIUM 8.4* 8.4* 7.6* 7.4* 7.3*  MG  --   --   --  2.0 1.9    GFR: Estimated Creatinine Clearance: 106.6 mL/min (by C-G formula based on SCr of 0.94 mg/dL).  Liver Function Tests: Recent Labs  Lab 12/15/23 0425 12/16/23 0441 12/17/23 0434 12/18/23 0427 12/19/23 0423  AST 18 26 18 18 17   ALT 18 24 17 14 11   ALKPHOS 58 66 51 53 60  BILITOT 1.6* 1.2 1.1 1.2 0.8  PROT 7.8 8.3* 6.6 6.6 5.6*  ALBUMIN 3.1* 3.0* 2.4* 2.2* 1.9*    CBG: No results for input(s): GLUCAP in the last 168 hours.   Recent Results (from the past 240 hours)  MRSA Next Gen by PCR, Nasal     Status: Abnormal   Collection Time: 12/14/23  9:05 PM   Specimen: Nasal Mucosa; Nasal Swab  Result Value  Ref Range Status   MRSA by PCR Next Gen DETECTED (A) NOT DETECTED Final    Comment: RESULT CALLED TO, READ BACK BY AND VERIFIED WITH: B. PAUDEA, RN [phone] 12/15/23 BY ALONSO CORDS (NOTE) The GeneXpert MRSA Assay (FDA approved for NASAL specimens only), is one component of a comprehensive MRSA colonization surveillance program. It is not intended to diagnose MRSA infection nor to guide or monitor treatment for MRSA infections. Test performance is not FDA approved in patients less than 24 years old. Performed at [facility], [address], [place], [place] [address]          Radiology Studies: DG Abd 2 Views Result Date: 12/18/2023 CLINICAL DATA:  Abdominal distension EXAM: ABDOMEN - 2 VIEW COMPARISON:  None Available. FINDINGS: Surgical drain in the RIGHT upper quadrant. Two gallstones noted the RIGHT upper quadrant. Mildly dilated loops of small bowel. No dynamic air-fluid levels. Gas in the LEFT colon. No gas the rectum. IMPRESSION: 1. Findings most suggestive of postoperative ileus. 2. Surgical drain RIGHT upper quadrant Electronically Signed   By: Jackquline Boxer M.D.   On: 12/18/2023 11:03        Scheduled Meds:  heparin  injection (subcutaneous)  5,000 Units Subcutaneous Q8H   mupirocin  ointment   Nasal BID   pantoprazole  (PROTONIX ) IV  40 mg Intravenous Q12H   Continuous Infusions:  sodium chloride  125 mL/hr at 12/19/23 0930   cefTRIAXone  (ROCEPHIN )  IV 2 g (12/18/23 1601)   metronidazole  500 mg (12/19/23 0534)   potassium chloride  10 mEq (12/19/23 1213)     LOS: 5 days    Time spent: 40 minutes    Toribio Hummer, MD [facility]   To contact the attending provider between 7A-7P or the covering provider during after hours 7P-7A, please log into the web site [email or web address] and access using universal  password for that web site. If you do not have the password, please call the hospital operator.  12/19/2023, 12:50 PM

## 2023-12-19 NOTE — Plan of Care (Signed)
   Problem: Education: Goal: Knowledge of General Education information will improve Description Including pain rating scale, medication(s)/side effects and non-pharmacologic comfort measures Outcome: Progressing

## 2023-12-19 NOTE — Progress Notes (Signed)
 This patient dx with s/p lap chole, JP in, now ileus, He has temperature 101.1,  He is currently on antibiotic. He's NPO. They are resting his stomach. May require IV tylenol .  Nurse will encourage IS.

## 2023-12-19 NOTE — Progress Notes (Signed)
 Pt pain level is 9.5/10 pt is moaning in clear discomfort. General surgery and hospitalist both notified. Awaiting new orders. Pt made aware.

## 2023-12-19 NOTE — Progress Notes (Signed)
 I recheck this patients vital signs again about 2215 and his temperature  had returned to 98.7. Patient showed no signs of dyspnea, no changes in respirations, no shortness of breath, no coughing with sputum, etc., Patient stated he feels fine.

## 2023-12-19 NOTE — Progress Notes (Addendum)
     Patient Name: Greg [name]           DOB: June 21, 1965  MRN: [id number]      Admission Date: 12/14/2023  Attending Provider: Sebastian Toribio GAILS, MD  Primary Diagnosis: Acute cholecystitis   Level of care: Med-Surg   OVERNIGHT EVENT   Febrile 101.1 F All other vital stable.  Not meeting sepsis criteria.  No other acute changes reported. Denies chills, chest discomfort, shortness of breath, cough, sputum production, sore throat, nausea, vomiting, diarrhea, urinary changes. He underwent laparoscopic fenestrated subtotal cholecystectomy and laparoscopic lysis of adhesions on 8/11.  Postop complications including ileus and possible bile leak. Currently receiving empiric IV Flagyl  and Rocephin . Has not had prior fevers in the past 48 hrs.  WBC has improved from 15.6 --> 8.9.   Plan: Blood cultures CBC Lactic Continue incentive spirometry    Lavanda Horns, DNP, ACNPC- AG [facility]

## 2023-12-19 NOTE — Progress Notes (Signed)
 Progress Note  4 Days Post-Op  Subjective: Patient reports abdominal pain overnight that has now dissipated. Feels that pain has been controlled with current medication regimen. Has not had chills since yesterday afternoon. Tolerating gum and candy. Denies flatulence. Has not had BM.  ROS  All negative with the exception of above.  Objective: Vital signs in last 24 hours: Temp:  [98 F (36.7 C)-99.1 F (37.3 C)] 98.5 F (36.9 C) (08/15 0523) Pulse Rate:  [72-93] 72 (08/15 0523) Resp:  [16-19] 16 (08/15 0523) BP: (123-150)/(79-100) 124/83 (08/15 0523) SpO2:  [92 %-94 %] 94 % (08/15 0523) Last BM Date : 12/14/23  Intake/Output from previous day: 08/14 0701 - 08/15 0700 In: 2442 [P.O.:80; I.V.:1098.4; IV Piggyback:1263.5] Out: 2830 [Urine:2550; Drains:280] Intake/Output this shift: Total I/O In: 616.9 [P.O.:60; I.V.:394.7; IV Piggyback:162.2] Out: -   PE: General: Pleasant, male who is laying in bed in NAD. Lungs: Respiratory effort nonlabored Abd: Soft with distention. Generalized tenderness to palpation. Incisions C/D/I. Drain present of right abdomen with brown thin fluid in bulb.  Psych: A&Ox3 with an appropriate affect.    Lab Results:  Recent Labs    12/18/23 0427 12/19/23 0423  WBC 9.8 8.9  HGB 12.6* 12.8*  HCT 40.3 40.9  PLT 220 215   BMET Recent Labs    12/18/23 0427 12/19/23 0423  NA 130* 132*  K 3.7 3.6  CL 96* 99  CO2 27 23  GLUCOSE 121* 126*  BUN 8 7  CREATININE 1.18 0.94  CALCIUM 7.4* 7.3*   PT/INR No results for input(s): LABPROT, INR in the last 72 hours. CMP     Component Value Date/Time   NA 132 (L) 12/19/2023 0423   K 3.6 12/19/2023 0423   CL 99 12/19/2023 0423   CO2 23 12/19/2023 0423   GLUCOSE 126 (H) 12/19/2023 0423   BUN 7 12/19/2023 0423   CREATININE 0.94 12/19/2023 0423   CALCIUM 7.3 (L) 12/19/2023 0423   PROT 5.6 (L) 12/19/2023 0423   ALBUMIN 1.9 (L) 12/19/2023 0423   AST 17 12/19/2023 0423   ALT 11 12/19/2023  0423   ALKPHOS 60 12/19/2023 0423   BILITOT 0.8 12/19/2023 0423   GFRNONAA >60 12/19/2023 0423   GFRAA >90 09/19/2013 0450   Lipase     Component Value Date/Time   LIPASE 29 12/14/2023 1347       Studies/Results: DG Abd 2 Views Result Date: 12/18/2023 CLINICAL DATA:  Abdominal distension EXAM: ABDOMEN - 2 VIEW COMPARISON:  None Available. FINDINGS: Surgical drain in the RIGHT upper quadrant. Two gallstones noted the RIGHT upper quadrant. Mildly dilated loops of small bowel. No dynamic air-fluid levels. Gas in the LEFT colon. No gas the rectum. IMPRESSION: 1. Findings most suggestive of postoperative ileus. 2. Surgical drain RIGHT upper quadrant Electronically Signed   By: Jackquline Boxer M.D.   On: 12/18/2023 11:03    Anti-infectives: Anti-infectives (From admission, onward)    Start     Dose/Rate Route Frequency Ordered Stop   12/15/23 1630  cefTRIAXone  (ROCEPHIN ) 2 g in sodium chloride  0.9 % 100 mL IVPB        2 g 200 mL/hr over 30 Minutes Intravenous Every 24 hours 12/15/23 0103     12/15/23 0600  metroNIDAZOLE  (FLAGYL ) IVPB 500 mg        500 mg 100 mL/hr over 60 Minutes Intravenous Every 12 hours 12/15/23 0103     12/14/23 1615  cefTRIAXone  (ROCEPHIN ) 2 g in sodium chloride  0.9 % 100  mL IVPB        2 g 200 mL/hr over 30 Minutes Intravenous  Once 12/14/23 1602 12/14/23 1658   12/14/23 1615  metroNIDAZOLE  (FLAGYL ) IVPB 500 mg        500 mg 100 mL/hr over 60 Minutes Intravenous  Once 12/14/23 1602 12/14/23 1910        Assessment/Plan POD4: S/P Procedure laparoscopic fenestrated subtotal cholecystectomy by Dr. Donnice Bury on 12/15/2023 -DG Abd 2 views from 8/14 showed ileus -Vitals stable. Afebrile; No chills since yesterday afternoon. -Cr 0.94 from 1.18 -WBC 8.9 from 9.8. Hemoglobin 12.8 -Continue sips with chips -Drain currently with 75. Total 280 from 8/14-8/15. -Will continue to follow; Patient aware of post-operative restrictions. Work letter to be provided  and outpatient follow up to be arranged.     FEN: NPO VTE: Heparin  injections ID: Ceftriaxone  and metronidazole     LOS: 5 days   I reviewed hospital notes, nursing notes, last 24 h vitals and pain scores, last 48 h intake and output, last 24 h labs and trends, and last 24 h imaging results.   Marjorie Carlyon Favre, [facility] 12/19/2023, 8:37 AM Please see Amion for pager number during day hours 7:00am-4:30pm

## 2023-12-20 DIAGNOSIS — K50819 Crohn's disease of both small and large intestine with unspecified complications: Secondary | ICD-10-CM | POA: Diagnosis not present

## 2023-12-20 DIAGNOSIS — E871 Hypo-osmolality and hyponatremia: Secondary | ICD-10-CM | POA: Diagnosis not present

## 2023-12-20 DIAGNOSIS — F411 Generalized anxiety disorder: Secondary | ICD-10-CM | POA: Diagnosis not present

## 2023-12-20 DIAGNOSIS — K810 Acute cholecystitis: Secondary | ICD-10-CM | POA: Diagnosis not present

## 2023-12-20 LAB — CBC WITH DIFFERENTIAL/PLATELET
Abs Immature Granulocytes: 0.05 K/uL (ref 0.00–0.07)
Abs Immature Granulocytes: 0.08 K/uL — ABNORMAL HIGH (ref 0.00–0.07)
Basophils Absolute: 0 K/uL (ref 0.0–0.1)
Basophils Absolute: 0.1 K/uL (ref 0.0–0.1)
Basophils Relative: 0 %
Basophils Relative: 1 %
Eosinophils Absolute: 0 K/uL (ref 0.0–0.5)
Eosinophils Absolute: 0.1 K/uL (ref 0.0–0.5)
Eosinophils Relative: 0 %
Eosinophils Relative: 1 %
HCT: 39.8 % (ref 39.0–52.0)
HCT: 41.4 % (ref 39.0–52.0)
Hemoglobin: 11.8 g/dL — ABNORMAL LOW (ref 13.0–17.0)
Hemoglobin: 12.9 g/dL — ABNORMAL LOW (ref 13.0–17.0)
Immature Granulocytes: 1 %
Immature Granulocytes: 1 %
Lymphocytes Relative: 9 %
Lymphocytes Relative: 10 %
Lymphs Abs: 0.8 K/uL (ref 0.7–4.0)
Lymphs Abs: 1.1 K/uL (ref 0.7–4.0)
MCH: 27.3 pg (ref 26.0–34.0)
MCH: 28.5 pg (ref 26.0–34.0)
MCHC: 29.6 g/dL — ABNORMAL LOW (ref 30.0–36.0)
MCHC: 31.2 g/dL (ref 30.0–36.0)
MCV: 92.1 fL (ref 80.0–100.0)
MCV: 91.4 fL (ref 80.0–100.0)
Monocytes Absolute: 1.1 K/uL — ABNORMAL HIGH (ref 0.1–1.0)
Monocytes Absolute: 1.4 K/uL — ABNORMAL HIGH (ref 0.1–1.0)
Monocytes Relative: 12 %
Monocytes Relative: 14 %
Neutro Abs: 7.4 K/uL (ref 1.7–7.7)
Neutro Abs: 7.8 K/uL — ABNORMAL HIGH (ref 1.7–7.7)
Neutrophils Relative %: 78 %
Neutrophils Relative %: 73 %
Platelets: 259 K/uL (ref 150–400)
Platelets: 293 K/uL (ref 150–400)
RBC: 4.32 MIL/uL (ref 4.22–5.81)
RBC: 4.53 MIL/uL (ref 4.22–5.81)
RDW: 13.8 % (ref 11.5–15.5)
RDW: 14 % (ref 11.5–15.5)
WBC: 9.5 K/uL (ref 4.0–10.5)
WBC: 10.5 K/uL (ref 4.0–10.5)
nRBC: 0 % (ref 0.0–0.2)
nRBC: 0 % (ref 0.0–0.2)

## 2023-12-20 LAB — COMPREHENSIVE METABOLIC PANEL WITH GFR
ALT: 13 U/L (ref 0–44)
AST: 19 U/L (ref 15–41)
Albumin: 2.2 g/dL — ABNORMAL LOW (ref 3.5–5.0)
Alkaline Phosphatase: 77 U/L (ref 38–126)
Anion gap: 9 (ref 5–15)
BUN: 7 mg/dL (ref 6–20)
CO2: 26 mmol/L (ref 22–32)
Calcium: 7.7 mg/dL — ABNORMAL LOW (ref 8.9–10.3)
Chloride: 97 mmol/L — ABNORMAL LOW (ref 98–111)
Creatinine, Ser: 0.96 mg/dL (ref 0.61–1.24)
GFR, Estimated: >60 mL/min (ref >60)
Glucose, Bld: 120 mg/dL — ABNORMAL HIGH (ref 70–99)
Potassium: 3.6 mmol/L (ref 3.5–5.1)
Sodium: 132 mmol/L — ABNORMAL LOW (ref 135–145)
Total Bilirubin: 0.9 mg/dL (ref 0.0–1.2)
Total Protein: 7 g/dL (ref 6.5–8.1)

## 2023-12-20 LAB — MAGNESIUM: Magnesium: 2.1 mg/dL (ref 1.7–2.4)

## 2023-12-20 LAB — LACTIC ACID, PLASMA: Lactic Acid, Venous: 0.9 mmol/L (ref 0.5–1.9)

## 2023-12-20 MED ORDER — POTASSIUM CHLORIDE 10 MEQ/100ML IV SOLN
10.0000 meq | INTRAVENOUS | Status: AC
  Administered 2023-12-20 (×5): 10 meq via INTRAVENOUS
  Filled 2023-12-20 (×5): qty 100

## 2023-12-20 MED ORDER — ALPRAZOLAM 0.25 MG PO TABS
0.2500 mg | ORAL_TABLET | Freq: Two times a day (BID) | ORAL | Status: DC | PRN
  Administered 2023-12-20 – 2023-12-23 (×7): 0.5 mg via ORAL
  Filled 2023-12-20 (×7): qty 2

## 2023-12-20 NOTE — Progress Notes (Signed)
 PROGRESS NOTE    Greg [name]  FMW:[id number] DOB: Mar 12, 1966 DOA: 12/14/2023 PCP: Loreli Elsie [name], MD    Chief Complaint  Patient presents with   Abdominal Pain    Brief Narrative:  58 y.o. male with medical history significant of Chrons disease who presents back to our facility after worsening abdominal pain, right upper quadrant.  Admitted for acute cholecystitis-General Surgery consulted and patient underwent laparoscopic cholecystectomy 12/15/2023.     Assessment & Plan:   Principal Problem:   Acute cholecystitis Active Problems:   Crohn's disease, question perirectal involvement   Gastroesophageal reflux disease   Generalized anxiety disorder   Insomnia   Venous insufficiency of lower extremity   Immunosuppression due to drug therapy (Humira  for Crohns disease)   Hyponatremia   Postoperative ileus (HCC)  #1 acute cholecystitis, POA -Patient noted to have presented with worsening abdominal pain, right upper quadrant. -Right upper quadrant ultrasound done with stones in the region of gallbladder neck/cystic duct with gallbladder wall thickening and pericholecystic fluid.  Negative Murphy sign.  Hepatic steatosis. - CT abdomen and pelvis done with significant change in gallbladder when compared with prior exam.  Gallstones in the region of gallbladder neck with wall thickening and pericholecystic fluid highly suspicious for acute cholecystitis in appropriate clinical setting.  Fatty liver.  GERD.  Persistent soft tissue swelling in the region of the mons pubis stable from prior exam. - Patient seen in consultation by general surgery and patient underwent laparoscopic fenestrated subtotal cholecystectomy, laparoscopic lysis of adhesions on 12/15/2023. - Patient noted to be tolerating full liquids initially and was placed on bowel rest after patient noted to have a postop ileus..   - Patient patient with improvement with abdominal distention, no nausea, passing flatus,  having bowel movements. - Patient started on clear liquid diet per general surgery this morning, 12/20/2023. -Also concern for possible bile leak. -CT abdomen and pelvis (12/19/2023) which showed gallbladder wall thickening and cholelithiasis with new foci of air throughout the gallbladder lumen and possibly along the wall with pericholecystic fluid collection, concerning for cholecystitis.  Interval placement of right-sided trans abdominal drainage catheter tip terminating the right perihepatic.  Small amount of pelvic fluid and diffuse peritoneal fat stranding suggestive of infectious/inflammatory process.  Interval development of multifocal pulmonary airspace consolidation/atelectasis with associated bilateral small pleural effusions.  Subcutaneous emphysema within the ventral pelvic wall. -Keep potassium approximately 4, magnesium  approximately 2. -Continue empiric IV Flagyl  and IV Rocephin . -Mobilize. -While patient does not meet sepsis criteria, and he has no fever, minimally elevated leukocytosis of 12.8 he is on Humira  and given his immunocompromise state may prolong his antibiotic course pending his surgery and postop course.  - Per general surgery.  2.  Postoperative ileus -Patient noted with abdominal distention, nausea, hypoactive bowel sounds. - Abdominal films ordered consistent with postoperative ileus. -Patient with clinical improvement with abdominal pain.   - Patient underwent repeat CT abdomen and pelvis (12/19/2023) which showed gallbladder wall thickening and cholelithiasis with new foci of air throughout the gallbladder lumen and possibly along the wall with pericholecystic fluid collection, concerning for cholecystitis.  Interval placement of right-sided trans abdominal drainage catheter tip terminating the right perihepatic.  Small amount of pelvic fluid and diffuse peritoneal fat stranding suggestive of infectious/inflammatory process.  Interval development of multifocal pulmonary  airspace consolidation/atelectasis with associated bilateral small pleural effusions.  Subcutaneous emphysema within the ventral pelvic wall.  - CT scan was reviewed by general surgery who felt changes secondary to postoperative  changes and will monitor for now.  -Patient with clinical improvement, having flatus, having bowel movements and is started on clears by general surgery. - Replete electrolytes to keep potassium approximately 4, magnesium  approximately 2. - Mobilize. - Pain management per general surgery.  3.  Crohn's disease without acute flare -Stable. - Patient on Humira  not due until 12/26/2023. - Outpatient follow-up with GI.  4.  Generalized anxiety - Patient currently with a postop ileus and as needed Xanax  was discontinued and patient placed on IV Versed .   - Patient however stated IV Versed  was too short acting and would prefer IV Ativan .   - Due to current shortage of IV Ativan  patient was placed on IV Valium  as needed.   - Patient now with bowel movements and flatus and started on a clear liquid diet per general surgery who have resumed patient's home regimen of Xanax  as needed.   - IV Valium  has been discontinued.  5.  Acute renal failure -Likely secondary to prerenal azotemia. - Improved with hydration.   6.  Hyponatremia - Improved with hydration.   7.  GERD - IV PPI.      DVT prophylaxis: Heparin  Code Status: Full Family Communication: Updated patient.  No family at bedside.  Disposition: Likely home when clinically improved and cleared by general surgery.  Status is: Inpatient Remains inpatient appropriate because: Severity of illness.   Consultants:  General Surgery: Dr. [name] 12/14/2023  Procedures:  CT abdomen and pelvis 12/14/2023, 12/19/2023 Right upper quadrant ultrasound 12/14/2023 Laparoscopic fenestrated subtotal cholecystectomy, laparoscopic lysis of adhesions per general surgery: Dr. Ebbie 12/15/2023 Abdominal films  12/18/2023  Antimicrobials:  Anti-infectives (From admission, onward)    Start     Dose/Rate Route Frequency Ordered Stop   12/15/23 1630  cefTRIAXone  (ROCEPHIN ) 2 g in sodium chloride  0.9 % 100 mL IVPB        2 g 200 mL/hr over 30 Minutes Intravenous Every 24 hours 12/15/23 0103     12/15/23 0600  metroNIDAZOLE  (FLAGYL ) IVPB 500 mg        500 mg 100 mL/hr over 60 Minutes Intravenous Every 12 hours 12/15/23 0103     12/14/23 1615  cefTRIAXone  (ROCEPHIN ) 2 g in sodium chloride  0.9 % 100 mL IVPB        2 g 200 mL/hr over 30 Minutes Intravenous  Once 12/14/23 1602 12/14/23 1658   12/14/23 1615  metroNIDAZOLE  (FLAGYL ) IVPB 500 mg        500 mg 100 mL/hr over 60 Minutes Intravenous  Once 12/14/23 1602 12/14/23 1910         Subjective: Patient noted ambulating in hallway.  States he feels significantly much better than he did yesterday.  States significant abdominal pain improvement.  Passing flatus.  States has multiple stools.  No chest pain or shortness of breath.   Objective: Vitals:   12/19/23 1301 12/19/23 2050 12/19/23 2207 12/20/23 0614  BP: (!) 160/86 (!) 140/88  (!) 144/91  Pulse: 89 90  90  Resp: 18 18  18   Temp: 98.1 F (36.7 C) (!) 101.1 F (38.4 C) 98.7 F (37.1 C) 98.6 F (37 C)  TempSrc: Oral Oral Oral Oral  SpO2: 92% 90%  90%  Weight:      Height:        Intake/Output Summary (Last 24 hours) at 12/20/2023 1113 Last data filed at 12/20/2023 1000 Gross per 24 hour  Intake 2954.96 ml  Output 2025 ml  Net 929.96 ml   [employer]  12/15/23 0717  Weight: 103 kg    Examination:  General exam: NAD. Respiratory system: Lungs clear to auscultation bilaterally.  No wheezes, no crackles, no rhonchi.  Fair air movement.  Speaking full sentences.   Cardiovascular system: RRR no murmurs rubs or gallops.  No JVD.  No pitting lower extremity edema. Gastrointestinal system: Abdomen is soft, less distended, less tender to palpation in the right upper quadrant and  left lower quadrant.  Right JP drain in place with bilious drainage noted.  Some scattered bowel sounds.  Central nervous system: Alert and oriented. No focal neurological deficits. Extremities: Symmetric 5 x 5 power. Skin: No rashes, lesions or ulcers Psychiatry: Judgement and insight appear normal. Mood & affect appropriate.     Data Reviewed: I have personally reviewed following labs and imaging studies  CBC: Recent Labs  Lab 12/17/23 0434 12/18/23 0427 12/19/23 0423 12/19/23 2339 12/20/23 0451  WBC 10.4 9.8 8.9 9.5 10.5  NEUTROABS  --   --   --  7.4 7.8*  HGB 12.8* 12.6* 12.8* 11.8* 12.9*  HCT 40.8 40.3 40.9 39.8 41.4  MCV 88.9 89.8 91.3 92.1 91.4  PLT 196 220 215 259 293    Basic Metabolic Panel: Recent Labs  Lab 12/16/23 0441 12/17/23 0434 12/18/23 0427 12/19/23 0423 12/20/23 0451  NA 131* 132* 130* 132* 132*  K 3.9 3.8 3.7 3.6 3.6  CL 94* 96* 96* 99 97*  CO2 25 25 27 23 26   GLUCOSE 140* 126* 121* 126* 120*  BUN 16 13 8 7 7   CREATININE 1.34* 1.27* 1.18 0.94 0.96  CALCIUM 8.4* 7.6* 7.4* 7.3* 7.7*  MG  --   --  2.0 1.9 2.1    GFR: Estimated Creatinine Clearance: 104.4 mL/min (by C-G formula based on SCr of 0.96 mg/dL).  Liver Function Tests: Recent Labs  Lab 12/16/23 0441 12/17/23 0434 12/18/23 0427 12/19/23 0423 12/20/23 0451  AST 26 18 18 17 19   ALT 24 17 14 11 13   ALKPHOS 66 51 53 60 77  BILITOT 1.2 1.1 1.2 0.8 0.9  PROT 8.3* 6.6 6.6 5.6* 7.0  ALBUMIN 3.0* 2.4* 2.2* 1.9* 2.2*    CBG: No results for input(s): GLUCAP in the last 168 hours.   Recent Results (from the past 240 hours)  MRSA Next Gen by PCR, Nasal     Status: Abnormal   Collection Time: 12/14/23  9:05 PM   Specimen: Nasal Mucosa; Nasal Swab  Result Value Ref Range Status   MRSA by PCR Next Gen DETECTED (A) NOT DETECTED Final    Comment: RESULT CALLED TO, READ BACK BY AND VERIFIED WITH: B. PAUDEA, RN [phone] 12/15/23 BY ALONSO CORDS (NOTE) The GeneXpert MRSA Assay (FDA approved  for NASAL specimens only), is one component of a comprehensive MRSA colonization surveillance program. It is not intended to diagnose MRSA infection nor to guide or monitor treatment for MRSA infections. Test performance is not FDA approved in patients less than 59 years old. Performed at [facility], [address] [place], [place] [address]          Radiology Studies: CT ABDOMEN PELVIS W CONTRAST Result Date: 12/19/2023 CLINICAL DATA:  Abdominal pain,, history of Crohn's disease EXAM: CT ABDOMEN AND PELVIS WITH CONTRAST TECHNIQUE: Multidetector CT imaging of the abdomen and pelvis was performed using the standard protocol following bolus administration of intravenous contrast. RADIATION DOSE REDUCTION: This exam was performed according to the departmental dose-optimization program which includes automated exposure control, adjustment of the  mA and/or kV according to patient size and/or use of iterative reconstruction technique. CONTRAST:  OMNIPAQUE  IOHEXOL  300 MG/ML  SOLN COMPARISON:  Abdomen radiograph December 19, 2023., CT abdomen pelvis December 14 2023 FINDINGS: Lower chest: Multifocal subsegmental atelectasis/consolidation with air bronchogram new to prior predominantly involving bilateral lower lobes. Bilateral new small pleural effusions. Hepatobiliary: Hepatomegaly measuring 22.2 cm. Hepatic steatosis. No focal enhancing liver lesion. Thick-walled gallbladder containing new foci of air within the lumen and possibly along the wall. Gallstones identified at the gallbladder neck. Small amount of fluid is identified in perihepatic. Interval placement of the trans abdominal drainage catheter tip terminating along the lateral aspect of the right hepatic contour. Pancreas: Unremarkable. No pancreatic ductal dilatation or surrounding inflammatory changes. Spleen: Splenomegaly measuring 14.1 cm. Adrenals/Urinary Tract: Adrenal glands are unremarkable. Bilateral simple  cortical cysts which does not require imaging follow-up. Kidneys are normal, without renal calculi, or hydronephrosis. Bladder is unremarkable. Stomach/Bowel: Stomach is within normal limits. Appendix is not visualized. No evidence of bowel wall thickening, distention, or inflammatory changes. Postsurgical changes along the ascending colon. There is suggestion of a possible chronic perianal fistulous tract along the right gluteal fold without significant inflammatory changes (2/128). Vascular/Lymphatic: Diffuse peritoneal fat stranding new to prior. Small ascites. No significant lymphadenopathy. Reproductive: Mild prostatomegaly Other: Multiple punctate foci of air slight subcutaneous emphysema throughout the scrotal sac, pubic and inguinal regions. Small amount of free fluid within the pelvic cavity. Musculoskeletal: No suspicious lesion. IMPRESSION: Gallbladder wall thickening and cholelithiasis with new foci of air throughout the gallbladder lumen and possibly along the wall with pericholecystic fluid collection, concerning for cholecystitis (query emphysematous?). Interval placement of a right sided trans abdominal drainage catheter tip terminating in right perihepatic. Small amount of pelvic fluid and diffuse peritoneal fat stranding suggestive of infectious/inflammatory process, new to prior. Interval development of multifocal pulmonary airspace consolidations/atelectasis with associated bilateral small pleural effusions. Subcutaneous emphysema within the ventral pelvic wall. Suggestion of chronic perianal fistula. Hepatomegaly and hepatic steatosis. Splenomegaly Electronically Signed   By: Megan  Zare M.D.   On: 12/19/2023 16:07   DG Abd 2 Views Result Date: 12/19/2023 CLINICAL DATA:  Ileus. EXAM: ABDOMEN - 2 VIEW COMPARISON:  December 18, 2023. FINDINGS: Surgical drain is seen in right side of abdomen. No definite colonic dilatation is noted. Possible cholelithiasis. Continued small bowel dilatation is  noted concerning for ileus or distal small bowel obstruction. IMPRESSION: Stable small bowel dilatation concerning for ileus or distal small bowel obstruction. Electronically Signed   By: Lynwood Landy Raddle M.D.   On: 12/19/2023 13:21        Scheduled Meds:  heparin  injection (subcutaneous)  5,000 Units Subcutaneous Q8H   mupirocin  ointment   Nasal BID   pantoprazole  (PROTONIX ) IV  40 mg Intravenous Q12H   Continuous Infusions:  cefTRIAXone  (ROCEPHIN )  IV 2 g (12/19/23 1608)   metronidazole  500 mg (12/20/23 0603)   potassium chloride  10 mEq (12/20/23 1025)     LOS: 6 days    Time spent: 40 minutes    Toribio Hummer, MD [facility]   To contact the attending provider between 7A-7P or the covering provider during after hours 7P-7A, please log into the web site [email or web address] and access using universal [place] password for that web site. If you do not have the password, please call the hospital operator.  12/20/2023, 11:13 AM

## 2023-12-20 NOTE — Progress Notes (Signed)
 Progress Note  5 Days Post-Op  Subjective: Patient reports min abdominal pain.  Having flatulence and BMs.    Objective: Vital signs in last 24 hours: Temp:  [98.1 F (36.7 C)-101.1 F (38.4 C)] 98.6 F (37 C) (08/16 0614) Pulse Rate:  [89-90] 90 (08/16 0614) Resp:  [18] 18 (08/16 0614) BP: (140-160)/(86-91) 144/91 (08/16 0614) SpO2:  [90 %-92 %] 90 % (08/16 0614) Last BM Date : 12/14/23  Intake/Output from previous day: 08/15 0701 - 08/16 0700 In: 3211.8 [P.O.:60; I.V.:2309.8; IV Piggyback:842.1] Out: 2695 [Urine:2350; Drains:345] Intake/Output this shift: No intake/output data recorded.  PE: General: Pleasant, male who is laying in bed in NAD. Lungs: Respiratory effort nonlabored Abd: Soft. Min tenderness to palpation. Incisions C/D/I. Drain present of right abdomen with bilious thin fluid in bulb.  Psych: A&Ox3 with an appropriate affect.    Lab Results:  Recent Labs    12/19/23 2339 12/20/23 0451  WBC 9.5 10.5  HGB 11.8* 12.9*  HCT 39.8 41.4  PLT 259 293   BMET Recent Labs    12/19/23 0423 12/20/23 0451  NA 132* 132*  K 3.6 3.6  CL 99 97*  CO2 23 26  GLUCOSE 126* 120*  BUN 7 7  CREATININE 0.94 0.96  CALCIUM 7.3* 7.7*   PT/INR No results for input(s): LABPROT, INR in the last 72 hours. CMP     Component Value Date/Time   NA 132 (L) 12/20/2023 0451   K 3.6 12/20/2023 0451   CL 97 (L) 12/20/2023 0451   CO2 26 12/20/2023 0451   GLUCOSE 120 (H) 12/20/2023 0451   BUN 7 12/20/2023 0451   CREATININE 0.96 12/20/2023 0451   CALCIUM 7.7 (L) 12/20/2023 0451   PROT 7.0 12/20/2023 0451   ALBUMIN 2.2 (L) 12/20/2023 0451   AST 19 12/20/2023 0451   ALT 13 12/20/2023 0451   ALKPHOS 77 12/20/2023 0451   BILITOT 0.9 12/20/2023 0451   GFRNONAA >60 12/20/2023 0451   GFRAA >90 09/19/2013 0450   Lipase     Component Value Date/Time   LIPASE 29 12/14/2023 1347       Studies/Results: CT ABDOMEN PELVIS W CONTRAST Result Date:  12/19/2023 CLINICAL DATA:  Abdominal pain,, history of Crohn's disease EXAM: CT ABDOMEN AND PELVIS WITH CONTRAST TECHNIQUE: Multidetector CT imaging of the abdomen and pelvis was performed using the standard protocol following bolus administration of intravenous contrast. RADIATION DOSE REDUCTION: This exam was performed according to the departmental dose-optimization program which includes automated exposure control, adjustment of the mA and/or kV according to patient size and/or use of iterative reconstruction technique. CONTRAST:  OMNIPAQUE  IOHEXOL  300 MG/ML  SOLN COMPARISON:  Abdomen radiograph December 19, 2023., CT abdomen pelvis December 14 2023 FINDINGS: Lower chest: Multifocal subsegmental atelectasis/consolidation with air bronchogram new to prior predominantly involving bilateral lower lobes. Bilateral new small pleural effusions. Hepatobiliary: Hepatomegaly measuring 22.2 cm. Hepatic steatosis. No focal enhancing liver lesion. Thick-walled gallbladder containing new foci of air within the lumen and possibly along the wall. Gallstones identified at the gallbladder neck. Small amount of fluid is identified in perihepatic. Interval placement of the trans abdominal drainage catheter tip terminating along the lateral aspect of the right hepatic contour. Pancreas: Unremarkable. No pancreatic ductal dilatation or surrounding inflammatory changes. Spleen: Splenomegaly measuring 14.1 cm. Adrenals/Urinary Tract: Adrenal glands are unremarkable. Bilateral simple cortical cysts which does not require imaging follow-up. Kidneys are normal, without renal calculi, or hydronephrosis. Bladder is unremarkable. Stomach/Bowel: Stomach is within normal limits. Appendix is  not visualized. No evidence of bowel wall thickening, distention, or inflammatory changes. Postsurgical changes along the ascending colon. There is suggestion of a possible chronic perianal fistulous tract along the right gluteal fold without significant  inflammatory changes (2/128). Vascular/Lymphatic: Diffuse peritoneal fat stranding new to prior. Small ascites. No significant lymphadenopathy. Reproductive: Mild prostatomegaly Other: Multiple punctate foci of air slight subcutaneous emphysema throughout the scrotal sac, pubic and inguinal regions. Small amount of free fluid within the pelvic cavity. Musculoskeletal: No suspicious lesion. IMPRESSION: Gallbladder wall thickening and cholelithiasis with new foci of air throughout the gallbladder lumen and possibly along the wall with pericholecystic fluid collection, concerning for cholecystitis (query emphysematous?). Interval placement of a right sided trans abdominal drainage catheter tip terminating in right perihepatic. Small amount of pelvic fluid and diffuse peritoneal fat stranding suggestive of infectious/inflammatory process, new to prior. Interval development of multifocal pulmonary airspace consolidations/atelectasis with associated bilateral small pleural effusions. Subcutaneous emphysema within the ventral pelvic wall. Suggestion of chronic perianal fistula. Hepatomegaly and hepatic steatosis. Splenomegaly Electronically Signed   By: Megan  Zare M.D.   On: 12/19/2023 16:07   DG Abd 2 Views Result Date: 12/19/2023 CLINICAL DATA:  Ileus. EXAM: ABDOMEN - 2 VIEW COMPARISON:  December 18, 2023. FINDINGS: Surgical drain is seen in right side of abdomen. No definite colonic dilatation is noted. Possible cholelithiasis. Continued small bowel dilatation is noted concerning for ileus or distal small bowel obstruction. IMPRESSION: Stable small bowel dilatation concerning for ileus or distal small bowel obstruction. Electronically Signed   By: Lynwood Landy Raddle M.D.   On: 12/19/2023 13:21   DG Abd 2 Views Result Date: 12/18/2023 CLINICAL DATA:  Abdominal distension EXAM: ABDOMEN - 2 VIEW COMPARISON:  None Available. FINDINGS: Surgical drain in the RIGHT upper quadrant. Two gallstones noted the RIGHT upper  quadrant. Mildly dilated loops of small bowel. No dynamic air-fluid levels. Gas in the LEFT colon. No gas the rectum. IMPRESSION: 1. Findings most suggestive of postoperative ileus. 2. Surgical drain RIGHT upper quadrant Electronically Signed   By: Jackquline Boxer M.D.   On: 12/18/2023 11:03    Anti-infectives: Anti-infectives (From admission, onward)    Start     Dose/Rate Route Frequency Ordered Stop   12/15/23 1630  cefTRIAXone  (ROCEPHIN ) 2 g in sodium chloride  0.9 % 100 mL IVPB        2 g 200 mL/hr over 30 Minutes Intravenous Every 24 hours 12/15/23 0103     12/15/23 0600  metroNIDAZOLE  (FLAGYL ) IVPB 500 mg        500 mg 100 mL/hr over 60 Minutes Intravenous Every 12 hours 12/15/23 0103     12/14/23 1615  cefTRIAXone  (ROCEPHIN ) 2 g in sodium chloride  0.9 % 100 mL IVPB        2 g 200 mL/hr over 30 Minutes Intravenous  Once 12/14/23 1602 12/14/23 1658   12/14/23 1615  metroNIDAZOLE  (FLAGYL ) IVPB 500 mg        500 mg 100 mL/hr over 60 Minutes Intravenous  Once 12/14/23 1602 12/14/23 1910        Assessment/Plan POD5: S/P Procedure laparoscopic fenestrated subtotal cholecystectomy by Dr. Donnice Bury on 12/15/2023 -DG Abd 2 views from 8/14 showed ileus -Vitals stable. Self limited fever last night -Cr stable -WBC 8.9 stable -start clears today -Drain currently with yesterday, will follow output.  If still elevated tomorrow, will need to discuss stent with GI -Will continue to follow; Patient aware of post-operative restrictions. Work letter to be provided and outpatient  follow up to be arranged.     FEN: clears VTE: Heparin  injections ID: Ceftriaxone  and metronidazole     LOS: 6 days   I reviewed hospital notes, nursing notes, last 24 h vitals and pain scores, last 48 h intake and output, last 24 h labs and trends, and last 24 h imaging results.   Keylan Costabile C Mirenda Baltazar, MD  [facility] Surgery 12/20/2023, 7:50 AM Please see Amion for pager number during day  hours 7:00am-4:30pm

## 2023-12-21 DIAGNOSIS — K50819 Crohn's disease of both small and large intestine with unspecified complications: Secondary | ICD-10-CM | POA: Diagnosis not present

## 2023-12-21 DIAGNOSIS — K810 Acute cholecystitis: Secondary | ICD-10-CM | POA: Diagnosis not present

## 2023-12-21 DIAGNOSIS — E871 Hypo-osmolality and hyponatremia: Secondary | ICD-10-CM | POA: Diagnosis not present

## 2023-12-21 DIAGNOSIS — F411 Generalized anxiety disorder: Secondary | ICD-10-CM | POA: Diagnosis not present

## 2023-12-21 LAB — COMPREHENSIVE METABOLIC PANEL WITH GFR
ALT: 13 U/L (ref 0–44)
AST: 21 U/L (ref 15–41)
Albumin: 2.2 g/dL — ABNORMAL LOW (ref 3.5–5.0)
Alkaline Phosphatase: 77 U/L (ref 38–126)
Anion gap: 8 (ref 5–15)
BUN: 5 mg/dL — ABNORMAL LOW (ref 6–20)
CO2: 27 mmol/L (ref 22–32)
Calcium: 7.8 mg/dL — ABNORMAL LOW (ref 8.9–10.3)
Chloride: 96 mmol/L — ABNORMAL LOW (ref 98–111)
Creatinine, Ser: 0.92 mg/dL (ref 0.61–1.24)
GFR, Estimated: >60 mL/min (ref >60)
Glucose, Bld: 143 mg/dL — ABNORMAL HIGH (ref 70–99)
Potassium: 3.8 mmol/L (ref 3.5–5.1)
Sodium: 131 mmol/L — ABNORMAL LOW (ref 135–145)
Total Bilirubin: 0.8 mg/dL (ref 0.0–1.2)
Total Protein: 6.8 g/dL (ref 6.5–8.1)

## 2023-12-21 LAB — CBC WITH DIFFERENTIAL/PLATELET
Abs Granulocyte: 6.3 K/uL (ref 1.5–6.5)
Abs Immature Granulocytes: 0.09 K/uL — ABNORMAL HIGH (ref 0.00–0.07)
Basophils Absolute: 0 K/uL (ref 0.0–0.1)
Basophils Relative: 1 %
Eosinophils Absolute: 0.1 K/uL (ref 0.0–0.5)
Eosinophils Relative: 1 %
HCT: 38.1 % — ABNORMAL LOW (ref 39.0–52.0)
Hemoglobin: 12.1 g/dL — ABNORMAL LOW (ref 13.0–17.0)
Immature Granulocytes: 1 %
Lymphocytes Relative: 10 %
Lymphs Abs: 0.9 K/uL (ref 0.7–4.0)
MCH: 28.5 pg (ref 26.0–34.0)
MCHC: 31.8 g/dL (ref 30.0–36.0)
MCV: 89.6 fL (ref 80.0–100.0)
Monocytes Absolute: 1.1 K/uL — ABNORMAL HIGH (ref 0.1–1.0)
Monocytes Relative: 13 %
Neutro Abs: 6.3 K/uL (ref 1.7–7.7)
Neutrophils Relative %: 74 %
Platelets: 272 K/uL (ref 150–400)
RBC: 4.25 MIL/uL (ref 4.22–5.81)
RDW: 13.8 % (ref 11.5–15.5)
WBC: 8.5 K/uL (ref 4.0–10.5)
nRBC: 0 % (ref 0.0–0.2)

## 2023-12-21 LAB — MAGNESIUM: Magnesium: 2 mg/dL (ref 1.7–2.4)

## 2023-12-21 MED ORDER — PANTOPRAZOLE SODIUM 40 MG IV SOLR
40.0000 mg | Freq: Two times a day (BID) | INTRAVENOUS | Status: DC
  Administered 2023-12-21 – 2023-12-22 (×2): 40 mg via INTRAVENOUS
  Filled 2023-12-21 (×2): qty 10

## 2023-12-21 MED ORDER — ENSURE PLUS HIGH PROTEIN PO LIQD
237.0000 mL | Freq: Two times a day (BID) | ORAL | Status: DC
  Administered 2023-12-21: 237 mL via ORAL

## 2023-12-21 MED ORDER — POTASSIUM CHLORIDE CRYS ER 20 MEQ PO TBCR
40.0000 meq | EXTENDED_RELEASE_TABLET | Freq: Once | ORAL | Status: AC
  Administered 2023-12-21: 40 meq via ORAL
  Filled 2023-12-21: qty 2

## 2023-12-21 MED ORDER — PANTOPRAZOLE SODIUM 40 MG PO TBEC: 40.0000 mg | DELAYED_RELEASE_TABLET | Freq: Two times a day (BID) | ORAL | Status: DC

## 2023-12-21 NOTE — Consult Note (Signed)
 [facility] Gastroenterology Consult  Referring Provider: No ref. provider found Primary Care Physician:  Loreli Elsie [name], MD Primary Gastroenterologist: [facility] - Dr. Rosalie  Reason for Consultation: Bile leak  SUBJECTIVE:   HPI: Greg [name] is a 58 y.o. male with past medical history significant for ileal Crohn's disease complicated by perianal fistula on adalimumab  since 2019 presented to hospital with chief complaint of abdominal pain. He has surgical history significant for ileal resection in 2003. He has undergone laparoscopic fenestrated partial cholecystectomy, lysis of adhesions on 12/15/23. He has biliary drain with persistent output.  CT scan of abdomen and pelvis on 12/19/23 showed thick walled gallbladder containing new foci or air within lumen, gallstones at gallbladder neck, suggestion of a possible chronic perianal fistulous tract along right gluteal fold, interval development of multifocal pulmonary airspace consolidations/atelectasis. [facility] requested to consult for bile leak.   On my evaluation of patient today, resting comfortably in bed. Denied nausea, vomiting. Had a formed bowel movement today which is rare for him (typically has loose stools). Some abdominal pain. No nausea, vomiting - is tolerating liquid diet. No chest pain or shortness of breath.   Colonoscopy 12/23/2018 - Dr. Donnald - normal appearing colonic mucosa with evidence of prior ileocolonic anastomosis in proximal ascending colon. Random biopsies of colon showed chronic inactive colitis negative for dysplasia. Biopsies of anastomosis showed benign ileocolonic mucosa with acute inflammation and reactive/regenerative epithelium.   Seen in office by Dr. Rosalie on 10/10/2023 and recommended to schedule colonoscopy in September 2025.  Past Medical History:  Diagnosis Date   Anxiety    Crohn's disease (HCC)    Depression    History of fatty infiltration of liver    IBS (irritable bowel syndrome)    Mass of  soft tissue of neck 10/14/2022   Other vitamin B12 deficiency anemia 03/16/2013   Perianal fistula    Perirectal abscess    Vitamin D deficiency    Past Surgical History:  Procedure Laterality Date   ADENOIDECTOMY     CHOLECYSTECTOMY N/A 12/15/2023   Procedure: LAPAROSCOPIC CHOLECYSTECTOMY; LYSIS OF ADHESIONS;  Surgeon: Ebbie Cough, MD;  Location: [facility]S;  Service: General;  Laterality: N/A;   ILEOCECETOMY  12/16/2001   for Crohn disease.  Dr Ethyl   MASS EXCISION N/A 10/22/2022   Procedure: EXCISION POSTERIOR NECK MASS;  Surgeon: Eletha Boas, MD;  Location: [facility];  Service: General;  Laterality: N/A;  GEN AND LMA   SMALL INTESTINE SURGERY  01/1997   Ileal resection for crohn SB strictures   Prior to Admission medications   Medication Sig Start Date End Date Taking? Authorizing Provider  acetaminophen  (TYLENOL ) 500 MG tablet Take 1,000 mg by mouth every 8 (eight) hours as needed.   Yes [provider]  Adalimumab  (HUMIRA ) 40 MG/0.8ML PSKT Inject 40 mg into the skin every 14 (fourteen) days.   Yes [provider]  ALPRAZolam  (XANAX ) 0.5 MG tablet Take 0.5 mg by mouth daily as needed for anxiety. 09/04/18  Yes [provider]  omega-3 acid ethyl esters (LOVAZA) 1 g capsule Take 2 capsules by mouth 2 (two) times daily.   Yes [provider]  ondansetron  (ZOFRAN -ODT) 4 MG disintegrating tablet Take 1 tablet (4 mg total) by mouth every 8 (eight) hours as needed. 12/12/23  Yes Henderly, Britni A, PA-C  oxyCODONE  (ROXICODONE ) 5 MG immediate release tablet Take 1 tablet (5 mg total) by mouth every 4 (four) hours as needed for severe pain (pain score 7-10).  12/12/23  Yes Henderly, Britni A, PA-C  pantoprazole  (PROTONIX ) 40 MG tablet Take 40 mg by mouth daily as needed.   Yes [provider]   Current Facility-Administered Medications  Medication Dose Route Frequency Provider Last Rate Last Admin   acetaminophen  (TYLENOL ) tablet 650 mg  650 mg Oral  Q4H PRN Sebastian Toribio GAILS, MD   650 mg at 12/21/23 1218   ALPRAZolam  (XANAX ) tablet 0.25-0.5 mg  0.25-0.5 mg Oral BID PRN Thomas, Alicia, MD   0.5 mg at 12/21/23 0748   alum & mag hydroxide-simeth (MAALOX/MYLANTA) 200-200-20 MG/5ML suspension 30 mL  30 mL Oral Q4H PRN Cindy Garnette POUR, MD   30 mL at 12/16/23 0343   cefTRIAXone  (ROCEPHIN ) 2 g in sodium chloride  0.9 % 100 mL IVPB  2 g Intravenous Q24H Ebbie Cough, MD 200 mL/hr at 12/20/23 1528 2 g at 12/20/23 1528   feeding supplement (ENSURE PLUS HIGH PROTEIN) liquid 237 mL  237 mL Oral BID BM Thomas, Alicia, MD   237 mL at 12/21/23 0932   heparin  injection 5,000 Units  5,000 Units Subcutaneous Q8H Ebbie Cough, MD   5,000 Units at 12/21/23 0542   HYDROmorphone  (DILAUDID ) injection 1-2 mg  1-2 mg Intravenous Q2H PRN Eletha Boas, MD   1 mg at 12/20/23 0150   hydrOXYzine  (ATARAX ) tablet 10 mg  10 mg Oral TID PRN Cindy Garnette POUR, MD       lip balm (CARMEX) ointment   Topical PRN Ebbie Cough, MD   1 Application at 12/14/23 1804   metroNIDAZOLE  (FLAGYL ) IVPB 500 mg  500 mg Intravenous Q12H Wakefield, Matthew, MD 100 mL/hr at 12/21/23 0544 500 mg at 12/21/23 0544   mupirocin  ointment (BACTROBAN ) 2 %   Nasal BID Ebbie Cough, MD   Given at 12/21/23 [phone]   ondansetron  (ZOFRAN ) injection 4 mg  4 mg Intravenous Q6H PRN Sebastian Toribio GAILS, MD       oxyCODONE  (Oxy IR/ROXICODONE ) immediate release tablet 5-10 mg  5-10 mg Oral Q4H PRN Eletha Boas, MD   5 mg at 12/21/23 0138   [START ON 12/22/2023] pantoprazole  (PROTONIX ) EC tablet 40 mg  40 mg Oral BID Mark Bard LABOR, RPH       pantoprazole  (PROTONIX ) injection 40 mg  40 mg Intravenous Q12H Mark Bard LABOR, RPH   40 mg at 12/21/23 9068   Allergies as of 12/14/2023 - Review Complete 12/14/2023  Allergen Reaction Noted   Nsaids Other (See Comments) 12/14/2023   Family History  Problem Relation Age of Onset   Cancer Mother        lung   Other Mother        pulmonary fibrosis    Social History   Socioeconomic History   Marital status: Single    Spouse name: Not on file   Number of children: 0   Years of education: college   Highest education level: Not on file  Occupational History   Occupation: self employed    Employer: Development worker, [employer]  Tobacco Use   Smoking status: Never   Smokeless tobacco: Never  Vaping Use   Vaping status: Never Used  Substance and Sexual Activity   Alcohol  use: Never   Drug use: No   Sexual activity: Never  Other Topics Concern   Not on file  Social History Narrative   Not on file   Social Drivers of Health   Financial Resource Strain: Not on file  Food Insecurity: No Food Insecurity (12/14/2023)   Hunger Vital Sign  Worried About Programme researcher, broadcasting/film/video in the Last Year: Never true    Ran Out of Food in the Last Year: Never true  Transportation Needs: No Transportation Needs (12/14/2023)   PRAPARE - Administrator, Civil Service (Medical): No    Lack of Transportation (Non-Medical): No  Physical Activity: Not on file  Stress: Not on file  Social Connections: Not on file  Intimate Partner Violence: Not At Risk (12/14/2023)   Humiliation, Afraid, Rape, and Kick questionnaire    Fear of Current or Ex-Partner: No    Emotionally Abused: No    Physically Abused: No    Sexually Abused: No   Review of Systems:  Review of Systems  Respiratory:  Negative for shortness of breath.   Cardiovascular:  Negative for chest pain.  Gastrointestinal:  Positive for abdominal pain. Negative for constipation, diarrhea, nausea and vomiting.    OBJECTIVE:   Temp:  [97.8 F (36.6 C)-99.3 F (37.4 C)] 99.3 F (37.4 C) (08/17 0528) Pulse Rate:  [78-85] 85 (08/17 0528) Resp:  [17] 17 (08/17 0528) BP: (114-134)/(64-84) 134/80 (08/17 0528) SpO2:  [95 %-96 %] 95 % (08/17 0528) Last BM Date : 12/21/23 Physical Exam Constitutional:      General: He is not in acute distress.    Appearance: He is not ill-appearing,  toxic-appearing or diaphoretic.  Cardiovascular:     Rate and Rhythm: Normal rate and regular rhythm.  Pulmonary:     Effort: No respiratory distress.     Breath sounds: Normal breath sounds.  Abdominal:     General: Bowel sounds are normal. There is no distension.     Palpations: Abdomen is soft.     Tenderness: There is no abdominal tenderness. There is no guarding.  Skin:    Comments: Drain appreciated in right abdomen with bilious contents.  Neurological:     Mental Status: He is alert.     Labs: Recent Labs    12/19/23 2339 12/20/23 0451 12/21/23 0440  WBC 9.5 10.5 8.5  HGB 11.8* 12.9* 12.1*  HCT 39.8 41.4 38.1*  PLT 259 293 272   BMET Recent Labs    12/19/23 0423 12/20/23 0451 12/21/23 0440  NA 132* 132* 131*  K 3.6 3.6 3.8  CL 99 97* 96*  CO2 23 26 27   GLUCOSE 126* 120* 143*  BUN 7 7 5*  CREATININE 0.94 0.96 0.92  CALCIUM 7.3* 7.7* 7.8*   LFT Recent Labs    12/21/23 0440  PROT 6.8  ALBUMIN 2.2*  AST 21  ALT 13  ALKPHOS 77  BILITOT 0.8   PT/INR No results for input(s): LABPROT, INR in the last 72 hours.  Diagnostic imaging: CT ABDOMEN PELVIS W CONTRAST Result Date: 12/19/2023 CLINICAL DATA:  Abdominal pain,, history of Crohn's disease EXAM: CT ABDOMEN AND PELVIS WITH CONTRAST TECHNIQUE: Multidetector CT imaging of the abdomen and pelvis was performed using the standard protocol following bolus administration of intravenous contrast. RADIATION DOSE REDUCTION: This exam was performed according to the departmental dose-optimization program which includes automated exposure control, adjustment of the mA and/or kV according to patient size and/or use of iterative reconstruction technique. CONTRAST:  OMNIPAQUE  IOHEXOL  300 MG/ML  SOLN COMPARISON:  Abdomen radiograph December 19, 2023., CT abdomen pelvis December 14 2023 FINDINGS: Lower chest: Multifocal subsegmental atelectasis/consolidation with air bronchogram new to prior predominantly involving  bilateral lower lobes. Bilateral new small pleural effusions. Hepatobiliary: Hepatomegaly measuring 22.2 cm. Hepatic steatosis. No focal enhancing liver lesion. Thick-walled gallbladder  containing new foci of air within the lumen and possibly along the wall. Gallstones identified at the gallbladder neck. Small amount of fluid is identified in perihepatic. Interval placement of the trans abdominal drainage catheter tip terminating along the lateral aspect of the right hepatic contour. Pancreas: Unremarkable. No pancreatic ductal dilatation or surrounding inflammatory changes. Spleen: Splenomegaly measuring 14.1 cm. Adrenals/Urinary Tract: Adrenal glands are unremarkable. Bilateral simple cortical cysts which does not require imaging follow-up. Kidneys are normal, without renal calculi, or hydronephrosis. Bladder is unremarkable. Stomach/Bowel: Stomach is within normal limits. Appendix is not visualized. No evidence of bowel wall thickening, distention, or inflammatory changes. Postsurgical changes along the ascending colon. There is suggestion of a possible chronic perianal fistulous tract along the right gluteal fold without significant inflammatory changes (2/128). Vascular/Lymphatic: Diffuse peritoneal fat stranding new to prior. Small ascites. No significant lymphadenopathy. Reproductive: Mild prostatomegaly Other: Multiple punctate foci of air slight subcutaneous emphysema throughout the scrotal sac, pubic and inguinal regions. Small amount of free fluid within the pelvic cavity. Musculoskeletal: No suspicious lesion. IMPRESSION: Gallbladder wall thickening and cholelithiasis with new foci of air throughout the gallbladder lumen and possibly along the wall with pericholecystic fluid collection, concerning for cholecystitis (query emphysematous?). Interval placement of a right sided trans abdominal drainage catheter tip terminating in right perihepatic. Small amount of pelvic fluid and diffuse peritoneal fat  stranding suggestive of infectious/inflammatory process, new to prior. Interval development of multifocal pulmonary airspace consolidations/atelectasis with associated bilateral small pleural effusions. Subcutaneous emphysema within the ventral pelvic wall. Suggestion of chronic perianal fistula. Hepatomegaly and hepatic steatosis. Splenomegaly Electronically Signed   By: Megan  Zare M.D.   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 Rosalie in office, pending repeat colonoscopy in outpatient setting  PLAN: -Recommend ERCP for bile leak, discussed case with advanced endoscopist, Dr. Charlanne, through [facility], recommend supportive care for ileus prior to ERCP -Discussed ERCP with patient, benefits and risks of bleeding/infection/perforation/anesthesia/pancreatitis, he verbalized understanding and elected to proceed -Patient symptoms of ileus appear mild, tolerating diet and had BM today -Will make NPO for likely ERCP tomorrow, Dr. Wilhelmenia takes over for  advanced endoscopy -Dr. Burnette will assume care for [facility] tomorrow    LOS: 7 days   Estefana Keas, [facility] Gastroenterology

## 2023-12-21 NOTE — Progress Notes (Signed)
 Progress Note  6 Days Post-Op  Subjective: Patient reports min abdominal pain.  Having flatulence and BMs.     Objective: Vital signs in last 24 hours: Temp:  [97.8 F (36.6 C)-99.3 F (37.4 C)] 99.3 F (37.4 C) (08/17 0528) Pulse Rate:  [78-85] 85 (08/17 0528) Resp:  [17] 17 (08/17 0528) BP: (114-134)/(64-84) 134/80 (08/17 0528) SpO2:  [95 %-96 %] 95 % (08/17 0528) Last BM Date : 12/21/23  Intake/Output from previous day: 08/16 0701 - 08/17 0700 In: 2037.8 [P.O.:1220; IV Piggyback:817.8] Out: 405 [Drains:405] Intake/Output this shift: Total I/O In: -  Out: 110 [Drains:110]  PE: General: Pleasant, male who is laying in bed in NAD. Lungs: Respiratory effort nonlabored Abd: Soft. Min tenderness to palpation. Incisions C/D/I. Drain present of right abdomen with bilious thin fluid in bulb.  Psych: A&Ox3 with an appropriate affect.    Lab Results:  Recent Labs    12/20/23 0451 12/21/23 0440  WBC 10.5 8.5  HGB 12.9* 12.1*  HCT 41.4 38.1*  PLT 293 272   BMET Recent Labs    12/20/23 0451 12/21/23 0440  NA 132* 131*  K 3.6 3.8  CL 97* 96*  CO2 26 27  GLUCOSE 120* 143*  BUN 7 5*  CREATININE 0.96 0.92  CALCIUM 7.7* 7.8*   PT/INR No results for input(s): LABPROT, INR in the last 72 hours. CMP     Component Value Date/Time   NA 131 (L) 12/21/2023 0440   K 3.8 12/21/2023 0440   CL 96 (L) 12/21/2023 0440   CO2 27 12/21/2023 0440   GLUCOSE 143 (H) 12/21/2023 0440   BUN 5 (L) 12/21/2023 0440   CREATININE 0.92 12/21/2023 0440   CALCIUM 7.8 (L) 12/21/2023 0440   PROT 6.8 12/21/2023 0440   ALBUMIN 2.2 (L) 12/21/2023 0440   AST 21 12/21/2023 0440   ALT 13 12/21/2023 0440   ALKPHOS 77 12/21/2023 0440   BILITOT 0.8 12/21/2023 0440   GFRNONAA >60 12/21/2023 0440   GFRAA >90 09/19/2013 0450   Lipase     Component Value Date/Time   LIPASE 29 12/14/2023 1347       Studies/Results: CT ABDOMEN PELVIS W CONTRAST Result Date: 12/19/2023 CLINICAL  DATA:  Abdominal pain,, history of Crohn's disease EXAM: CT ABDOMEN AND PELVIS WITH CONTRAST TECHNIQUE: Multidetector CT imaging of the abdomen and pelvis was performed using the standard protocol following bolus administration of intravenous contrast. RADIATION DOSE REDUCTION: This exam was performed according to the departmental dose-optimization program which includes automated exposure control, adjustment of the mA and/or kV according to patient size and/or use of iterative reconstruction technique. CONTRAST:  OMNIPAQUE  IOHEXOL  300 MG/ML  SOLN COMPARISON:  Abdomen radiograph December 19, 2023., CT abdomen pelvis December 14 2023 FINDINGS: Lower chest: Multifocal subsegmental atelectasis/consolidation with air bronchogram new to prior predominantly involving bilateral lower lobes. Bilateral new small pleural effusions. Hepatobiliary: Hepatomegaly measuring 22.2 cm. Hepatic steatosis. No focal enhancing liver lesion. Thick-walled gallbladder containing new foci of air within the lumen and possibly along the wall. Gallstones identified at the gallbladder neck. Small amount of fluid is identified in perihepatic. Interval placement of the trans abdominal drainage catheter tip terminating along the lateral aspect of the right hepatic contour. Pancreas: Unremarkable. No pancreatic ductal dilatation or surrounding inflammatory changes. Spleen: Splenomegaly measuring 14.1 cm. Adrenals/Urinary Tract: Adrenal glands are unremarkable. Bilateral simple cortical cysts which does not require imaging follow-up. Kidneys are normal, without renal calculi, or hydronephrosis. Bladder is unremarkable. Stomach/Bowel: Stomach is within  normal limits. Appendix is not visualized. No evidence of bowel wall thickening, distention, or inflammatory changes. Postsurgical changes along the ascending colon. There is suggestion of a possible chronic perianal fistulous tract along the right gluteal fold without significant inflammatory changes  (2/128). Vascular/Lymphatic: Diffuse peritoneal fat stranding new to prior. Small ascites. No significant lymphadenopathy. Reproductive: Mild prostatomegaly Other: Multiple punctate foci of air slight subcutaneous emphysema throughout the scrotal sac, pubic and inguinal regions. Small amount of free fluid within the pelvic cavity. Musculoskeletal: No suspicious lesion. IMPRESSION: Gallbladder wall thickening and cholelithiasis with new foci of air throughout the gallbladder lumen and possibly along the wall with pericholecystic fluid collection, concerning for cholecystitis (query emphysematous?). Interval placement of a right sided trans abdominal drainage catheter tip terminating in right perihepatic. Small amount of pelvic fluid and diffuse peritoneal fat stranding suggestive of infectious/inflammatory process, new to prior. Interval development of multifocal pulmonary airspace consolidations/atelectasis with associated bilateral small pleural effusions. Subcutaneous emphysema within the ventral pelvic wall. Suggestion of chronic perianal fistula. Hepatomegaly and hepatic steatosis. Splenomegaly Electronically Signed   By: Megan  Zare M.D.   On: 12/19/2023 16:07   DG Abd 2 Views Result Date: 12/19/2023 CLINICAL DATA:  Ileus. EXAM: ABDOMEN - 2 VIEW COMPARISON:  December 18, 2023. FINDINGS: Surgical drain is seen in right side of abdomen. No definite colonic dilatation is noted. Possible cholelithiasis. Continued small bowel dilatation is noted concerning for ileus or distal small bowel obstruction. IMPRESSION: Stable small bowel dilatation concerning for ileus or distal small bowel obstruction. Electronically Signed   By: Lynwood Landy Raddle M.D.   On: 12/19/2023 13:21    Anti-infectives: Anti-infectives (From admission, onward)    Start     Dose/Rate Route Frequency Ordered Stop   12/15/23 1630  cefTRIAXone  (ROCEPHIN ) 2 g in sodium chloride  0.9 % 100 mL IVPB        2 g 200 mL/hr over 30 Minutes Intravenous  Every 24 hours 12/15/23 0103     12/15/23 0600  metroNIDAZOLE  (FLAGYL ) IVPB 500 mg        500 mg 100 mL/hr over 60 Minutes Intravenous Every 12 hours 12/15/23 0103     12/14/23 1615  cefTRIAXone  (ROCEPHIN ) 2 g in sodium chloride  0.9 % 100 mL IVPB        2 g 200 mL/hr over 30 Minutes Intravenous  Once 12/14/23 1602 12/14/23 1658   12/14/23 1615  metroNIDAZOLE  (FLAGYL ) IVPB 500 mg        500 mg 100 mL/hr over 60 Minutes Intravenous  Once 12/14/23 1602 12/14/23 1910        Assessment/Plan POD6: S/P Procedure laparoscopic fenestrated subtotal cholecystectomy by Dr. Donnice Bury on 12/15/2023 -DG Abd 2 views from 8/14 showed ileus -Advance to fulls today and monitor drain output -Drain currently with yesterday, discussed possible stent with GI.  They will see later today -Will continue to follow; Patient aware of post-operative restrictions. Work letter to be provided and outpatient follow up to be arranged.     FEN: fulls VTE: Heparin  injections ID: Ceftriaxone  and metronidazole     LOS: 7 days   I reviewed hospital notes, nursing notes, last 24 h vitals and pain scores, last 48 h intake and output, last 24 h labs and trends, and last 24 h imaging results.   Bernarda JAYSON Ned, MD  [facility] Surgery 12/21/2023, 8:03 AM Please see Amion for pager number during day hours 7:00am-4:30pm

## 2023-12-21 NOTE — Progress Notes (Signed)
 PROGRESS NOTE    Greg [name]  FMW:[id number] DOB: 1966/01/13 DOA: 12/14/2023 PCP: Greg Elsie [name], MD    Chief Complaint  Patient presents with   Abdominal Pain    Brief Narrative:  58 y.o. male with medical history significant of Chrons disease who presents back to our facility after worsening abdominal pain, right upper quadrant.  Admitted for acute cholecystitis-General Surgery consulted and patient underwent laparoscopic cholecystectomy 12/15/2023.     Assessment & Plan:   Principal Problem:   Acute cholecystitis Active Problems:   Crohn's disease, question perirectal involvement   Gastroesophageal reflux disease   Generalized anxiety disorder   Insomnia   Venous insufficiency of lower extremity   Immunosuppression due to drug therapy (Humira  for Crohns disease)   Hyponatremia   Postoperative ileus (HCC)  #1 acute cholecystitis, POA -Patient noted to have presented with worsening abdominal pain, right upper quadrant. -Right upper quadrant ultrasound done with stones in the region of gallbladder neck/cystic duct with gallbladder wall thickening and pericholecystic fluid.  Negative Murphy sign.  Hepatic steatosis. - CT abdomen and pelvis done with significant change in gallbladder when compared with prior exam.  Gallstones in the region of gallbladder neck with wall thickening and pericholecystic fluid highly suspicious for acute cholecystitis in appropriate clinical setting.  Fatty liver.  GERD.  Persistent soft tissue swelling in the region of the mons pubis stable from prior exam. - Patient seen in consultation by general surgery and patient underwent laparoscopic fenestrated subtotal cholecystectomy, laparoscopic lysis of adhesions on 12/15/2023. - Patient noted to be tolerating full liquids initially and was placed on bowel rest after patient noted to have a postop ileus..   - Patient patient with improvement with abdominal distention, no nausea, passing flatus,  having bowel movements. - Patient started on clear liquid diet per general surgery this morning, 12/20/2023. -Also concern for possible bile leak. -CT abdomen and pelvis (12/19/2023) which showed gallbladder wall thickening and cholelithiasis with new foci of air throughout the gallbladder lumen and possibly along the wall with pericholecystic fluid collection, concerning for cholecystitis.  Interval placement of right-sided trans abdominal drainage catheter tip terminating the right perihepatic.  Small amount of pelvic fluid and diffuse peritoneal fat stranding suggestive of infectious/inflammatory process.  Interval development of multifocal pulmonary airspace consolidation/atelectasis with associated bilateral small pleural effusions.  Subcutaneous emphysema within the ventral pelvic wall. -Keep potassium approximately 4, magnesium  approximately 2. -Continue empiric IV Flagyl  and IV Rocephin . -Mobilize. -While patient does not meet sepsis criteria, and he has no fever, minimally elevated leukocytosis of 12.8 he is on Humira  and given his immunocompromise state may prolong his antibiotic course pending his surgery and postop course.  - Per general surgery.  2.  Bile leak -Patient noted with concerns for biliary leak as output from the drain with bile with output of 405 mL over the past 24 hours - Patient being followed by general surgery who have consulted with GI for possible stent placement. - Patient seen in consultation by GI who are recommending ERCP for bile leak hopefully to be done tomorrow, 12/22/2023. - Per GI.  3.  Postoperative ileus--improved -Patient noted with abdominal distention, nausea, hypoactive bowel sounds. - Abdominal films ordered consistent with postoperative ileus. -Patient with clinical improvement with abdominal pain.   - Patient underwent repeat CT abdomen and pelvis (12/19/2023) which showed gallbladder wall thickening and cholelithiasis with new foci of air throughout  the gallbladder lumen and possibly along the wall with pericholecystic fluid collection, concerning  for cholecystitis.  Interval placement of right-sided trans abdominal drainage catheter tip terminating the right perihepatic.  Small amount of pelvic fluid and diffuse peritoneal fat stranding suggestive of infectious/inflammatory process.  Interval development of multifocal pulmonary airspace consolidation/atelectasis with associated bilateral small pleural effusions.  Subcutaneous emphysema within the ventral pelvic wall.  - CT scan was reviewed by general surgery who felt changes secondary to postoperative changes and will monitor for now.  -Patient with clinical improvement, having flatus, having bowel movements and is started on clears and diet advanced to full liquids which patient is tolerating. - Keep electrolytes repleted for potassium approximately 4, magnesium  approximately 2.  - Mobilize. - Pain management per general surgery.  4.  Crohn's disease without acute flare -Stable. - Patient on Humira  not due until 12/26/2023. - Outpatient follow-up with GI.  5.  Generalized anxiety - Patient currently with a postop ileus and as needed Xanax  was discontinued and patient placed on IV Versed .   - Patient however stated IV Versed  was too short acting and would prefer IV Ativan .   - Due to current shortage of IV Ativan  patient was placed on IV Valium  as needed.   - Patient now with bowel movements and flatus and started on a clear liquid diet per general surgery who have resumed patient's home regimen of Xanax  as needed.   -IV Valium  discontinued.  6.  Acute renal failure -Likely secondary to prerenal azotemia. - Improved with hydration.   7.  Hyponatremia - Improved with hydration.   8.  GERD - Continue IV PPI.      DVT prophylaxis: Heparin  Code Status: Full Family Communication: Updated patient.  No family at bedside.  Disposition: Likely home when clinically improved and cleared  by general surgery.  Status is: Inpatient Remains inpatient appropriate because: Severity of illness.   Consultants:  General Surgery: Dr. [name] 12/14/2023 Gastroenterology: Dr. Kriss 12/21/2023  Procedures:  CT abdomen and pelvis 12/14/2023, 12/19/2023 Right upper quadrant ultrasound 12/14/2023 Laparoscopic fenestrated subtotal cholecystectomy, laparoscopic lysis of adhesions per general surgery: Dr. Ebbie 12/15/2023 Abdominal films 12/18/2023  Antimicrobials:  Anti-infectives (From admission, onward)    Start     Dose/Rate Route Frequency Ordered Stop   12/15/23 1630  cefTRIAXone  (ROCEPHIN ) 2 g in sodium chloride  0.9 % 100 mL IVPB        2 g 200 mL/hr over 30 Minutes Intravenous Every 24 hours 12/15/23 0103     12/15/23 0600  metroNIDAZOLE  (FLAGYL ) IVPB 500 mg        500 mg 100 mL/hr over 60 Minutes Intravenous Every 12 hours 12/15/23 0103     12/14/23 1615  cefTRIAXone  (ROCEPHIN ) 2 g in sodium chloride  0.9 % 100 mL IVPB        2 g 200 mL/hr over 30 Minutes Intravenous  Once 12/14/23 1602 12/14/23 1658   12/14/23 1615  metroNIDAZOLE  (FLAGYL ) IVPB 500 mg        500 mg 100 mL/hr over 60 Minutes Intravenous  Once 12/14/23 1602 12/14/23 1910         Subjective: Patient laying in bed.  Denies any chest pain or shortness of breath.  Passing flatus.  Having bowel movements.  Overall feeling better than he did over the next 24 hours.    Objective: Vitals:   12/20/23 1348 12/20/23 2132 12/21/23 0528 12/21/23 1330  BP: 131/84 114/64 134/80 (!) 142/85  Pulse: 79 78 85 85  Resp:  17 17 18   Temp: 97.8 F (36.6 C) 99 F (37.2  C) 99.3 F (37.4 C) 98.6 F (37 C)  TempSrc: Oral Oral Oral Oral  SpO2: 96% 96% 95% 94%  Weight:      Height:        Intake/Output Summary (Last 24 hours) at 12/21/2023 1606 Last data filed at 12/21/2023 1200 Gross per 24 hour  Intake 1757.8 ml  Output 495 ml  Net 1262.8 ml   Filed Weights   12/15/23 0717  Weight: 103 kg     Examination:  General exam: NAD. Respiratory system: CTAB.  No wheezes, no crackles, no rhonchi.  Fair air movement.  Speaking in full sentences.  Cardiovascular system: Regular rate rhythm no murmurs rubs or gallops.  No JVD.  No pitting lower extremity edema. Gastrointestinal system: Abdomen is soft, mildly distended, less tender to palpation in the right upper quadrant and left lower quadrant.  Right JP drain in place with bilious drainage.  Central nervous system: Alert and oriented. No focal neurological deficits. Extremities: Symmetric 5 x 5 power. Skin: No rashes, lesions or ulcers Psychiatry: Judgement and insight appear normal. Mood & affect appropriate.     Data Reviewed: I have personally reviewed following labs and imaging studies  CBC: Recent Labs  Lab 12/18/23 0427 12/19/23 0423 12/19/23 2339 12/20/23 0451 12/21/23 0440  WBC 9.8 8.9 9.5 10.5 8.5  NEUTROABS  --   --  7.4 7.8* 6.3  HGB 12.6* 12.8* 11.8* 12.9* 12.1*  HCT 40.3 40.9 39.8 41.4 38.1*  MCV 89.8 91.3 92.1 91.4 89.6  PLT 220 215 259 293 272    Basic Metabolic Panel: Recent Labs  Lab 12/17/23 0434 12/18/23 0427 12/19/23 0423 12/20/23 0451 12/21/23 0440  NA 132* 130* 132* 132* 131*  K 3.8 3.7 3.6 3.6 3.8  CL 96* 96* 99 97* 96*  CO2 25 27 23 26 27   GLUCOSE 126* 121* 126* 120* 143*  BUN 13 8 7 7  5*  CREATININE 1.27* 1.18 0.94 0.96 0.92  CALCIUM 7.6* 7.4* 7.3* 7.7* 7.8*  MG  --  2.0 1.9 2.1 2.0    GFR: Estimated Creatinine Clearance: 109 mL/min (by C-G formula based on SCr of 0.92 mg/dL).  Liver Function Tests: Recent Labs  Lab 12/17/23 0434 12/18/23 0427 12/19/23 0423 12/20/23 0451 12/21/23 0440  AST 18 18 17 19 21   ALT 17 14 11 13 13   ALKPHOS 51 53 60 77 77  BILITOT 1.1 1.2 0.8 0.9 0.8  PROT 6.6 6.6 5.6* 7.0 6.8  ALBUMIN 2.4* 2.2* 1.9* 2.2* 2.2*    CBG: No results for input(s): GLUCAP in the last 168 hours.   Recent Results (from the past 240 hours)  MRSA Next Gen by  PCR, Nasal     Status: Abnormal   Collection Time: 12/14/23  9:05 PM   Specimen: Nasal Mucosa; Nasal Swab  Result Value Ref Range Status   MRSA by PCR Next Gen DETECTED (A) NOT DETECTED Final    Comment: RESULT CALLED TO, READ BACK BY AND VERIFIED WITH: B. PAUDEA, RN [phone] 12/15/23 BY ALONSO CORDS (NOTE) The GeneXpert MRSA Assay (FDA approved for NASAL specimens only), is one component of a comprehensive MRSA colonization surveillance program. It is not intended to diagnose MRSA infection nor to guide or monitor treatment for MRSA infections. Test performance is not FDA approved in patients less than 30 years old. Performed at [facility], [address], [place], [place] [address]   Culture, blood (x 2)     Status: None (Preliminary result)   Collection  Time: 12/19/23 11:39 PM   Specimen: BLOOD RIGHT ARM  Result Value Ref Range Status   Specimen Description   Final    BLOOD RIGHT ARM Performed at [facility], [address], [place], [place] [address]    Special Requests   Final    BOTTLES DRAWN AEROBIC AND ANAEROBIC Blood Culture results may not be optimal due to an inadequate volume of blood received in culture bottles Performed at [facility], [address] [place], [place] [address]    Culture   Final    NO GROWTH 1 DAY Performed at [facility], [address], [place], [place] [address]    Report Status PENDING  Incomplete  Culture, blood (x 2)     Status: None (Preliminary result)   Collection Time: 12/19/23 11:39 PM   Specimen: BLOOD RIGHT ARM  Result Value Ref Range Status   Specimen Description   Final    BLOOD RIGHT ARM Performed at [facility], [address] [place], [place] [address]    Special Requests   Final    BOTTLES DRAWN AEROBIC AND ANAEROBIC Blood Culture adequate volume Performed at [facility], [address] [place], [place] [address]    Culture   Final    NO GROWTH  1 DAY Performed at [facility], [address], [place], [place] [address]    Report Status PENDING  Incomplete         Radiology Studies: No results found.       Scheduled Meds:  feeding supplement  237 mL Oral BID BM   heparin  injection (subcutaneous)  5,000 Units Subcutaneous Q8H   mupirocin  ointment   Nasal BID   [START ON 12/22/2023] pantoprazole   40 mg Oral BID   pantoprazole  (PROTONIX ) IV  40 mg Intravenous Q12H   Continuous Infusions:  cefTRIAXone  (ROCEPHIN )  IV 2 g (12/21/23 1531)   metronidazole  500 mg (12/21/23 0544)     LOS: 7 days    Time spent: 40 minutes    Toribio Hummer, MD [facility]   To contact the attending provider between 7A-7P or the covering provider during after hours 7P-7A, please log into the web site [email or web address] and access using universal  password for that web site. If you do not have the password, please call the hospital operator.  12/21/2023, 4:06 PM

## 2023-12-22 ENCOUNTER — Encounter (HOSPITAL_COMMUNITY)
Admission: EM | Discharge status: Home / Self Care | Payer: Self-pay | Source: Home / Self Care | Attending: Internal Medicine

## 2023-12-22 DIAGNOSIS — E871 Hypo-osmolality and hyponatremia: Secondary | ICD-10-CM | POA: Diagnosis not present

## 2023-12-22 DIAGNOSIS — F411 Generalized anxiety disorder: Secondary | ICD-10-CM | POA: Diagnosis not present

## 2023-12-22 DIAGNOSIS — K50819 Crohn's disease of both small and large intestine with unspecified complications: Secondary | ICD-10-CM | POA: Diagnosis not present

## 2023-12-22 DIAGNOSIS — K810 Acute cholecystitis: Secondary | ICD-10-CM | POA: Diagnosis not present

## 2023-12-22 LAB — CBC WITH DIFFERENTIAL/PLATELET
Abs Immature Granulocytes: 0.07 K/uL (ref 0.00–0.07)
Basophils Absolute: 0 K/uL (ref 0.0–0.1)
Basophils Relative: 1 %
Eosinophils Absolute: 0.1 K/uL (ref 0.0–0.5)
Eosinophils Relative: 1 %
HCT: 39.2 % (ref 39.0–52.0)
Hemoglobin: 12.3 g/dL — ABNORMAL LOW (ref 13.0–17.0)
Immature Granulocytes: 1 %
Lymphocytes Relative: 13 %
Lymphs Abs: 1.1 K/uL (ref 0.7–4.0)
MCH: 28.4 pg (ref 26.0–34.0)
MCHC: 31.4 g/dL (ref 30.0–36.0)
MCV: 90.5 fL (ref 80.0–100.0)
Monocytes Absolute: 0.9 K/uL (ref 0.1–1.0)
Monocytes Relative: 10 %
Neutro Abs: 6.2 K/uL (ref 1.7–7.7)
Neutrophils Relative %: 74 %
Platelets: 301 K/uL (ref 150–400)
RBC: 4.33 MIL/uL (ref 4.22–5.81)
RDW: 14 % (ref 11.5–15.5)
WBC: 8.3 K/uL (ref 4.0–10.5)
nRBC: 0 % (ref 0.0–0.2)

## 2023-12-22 LAB — COMPREHENSIVE METABOLIC PANEL WITH GFR
ALT: 12 U/L (ref 0–44)
AST: 18 U/L (ref 15–41)
Albumin: 2.5 g/dL — ABNORMAL LOW (ref 3.5–5.0)
Alkaline Phosphatase: 81 U/L (ref 38–126)
Anion gap: 12 (ref 5–15)
BUN: <5 mg/dL — ABNORMAL LOW (ref 6–20)
CO2: 23 mmol/L (ref 22–32)
Calcium: 8.1 mg/dL — ABNORMAL LOW (ref 8.9–10.3)
Chloride: 98 mmol/L (ref 98–111)
Creatinine, Ser: 0.64 mg/dL (ref 0.61–1.24)
GFR, Estimated: >60 mL/min (ref >60)
Glucose, Bld: 124 mg/dL — ABNORMAL HIGH (ref 70–99)
Potassium: 3.5 mmol/L (ref 3.5–5.1)
Sodium: 133 mmol/L — ABNORMAL LOW (ref 135–145)
Total Bilirubin: 0.7 mg/dL (ref 0.0–1.2)
Total Protein: 6.9 g/dL (ref 6.5–8.1)

## 2023-12-22 LAB — MAGNESIUM: Magnesium: 1.9 mg/dL (ref 1.7–2.4)

## 2023-12-22 SURGERY — ERCP, WITH INTERVENTION IF INDICATED
Anesthesia: General

## 2023-12-22 MED ORDER — METRONIDAZOLE 500 MG/100ML IV SOLN
500.0000 mg | Freq: Once | INTRAVENOUS | Status: AC
  Administered 2023-12-22: 500 mg via INTRAVENOUS

## 2023-12-22 MED ORDER — PANTOPRAZOLE SODIUM 40 MG PO TBEC
40.0000 mg | DELAYED_RELEASE_TABLET | Freq: Two times a day (BID) | ORAL | Status: DC
  Administered 2023-12-23: 40 mg via ORAL
  Filled 2023-12-22: qty 1

## 2023-12-22 MED ORDER — SODIUM CHLORIDE 0.9 % IV SOLN: 1.5000 g | Freq: Once | INTRAVENOUS | Status: DC

## 2023-12-22 MED ORDER — POTASSIUM CHLORIDE CRYS ER 10 MEQ PO TBCR
40.0000 meq | EXTENDED_RELEASE_TABLET | Freq: Once | ORAL | Status: AC
  Administered 2023-12-22: 40 meq via ORAL
  Filled 2023-12-22: qty 4

## 2023-12-22 NOTE — Progress Notes (Signed)
 Pt is in bed resting with eyes closed. No visible distress, no difficulties breathing.

## 2023-12-22 NOTE — Progress Notes (Signed)
 PROGRESS NOTE    Greg [name]  FMW:[id number] DOB: 17-Feb-1966 DOA: 12/14/2023 PCP: Loreli Elsie [name], MD    Chief Complaint  Patient presents with   Abdominal Pain    Brief Narrative:  58 y.o. male with medical history significant of Chrons disease who presents back to our facility after worsening abdominal pain, right upper quadrant.  Admitted for acute cholecystitis-General Surgery consulted and patient underwent laparoscopic cholecystectomy 12/15/2023.     Assessment & Plan:   Principal Problem:   Acute cholecystitis Active Problems:   Crohn's disease, question perirectal involvement   Gastroesophageal reflux disease   Generalized anxiety disorder   Insomnia   Venous insufficiency of lower extremity   Immunosuppression due to drug therapy (Humira  for Crohns disease)   Hyponatremia   Postoperative ileus (HCC)  #1 acute cholecystitis, POA -Patient noted to have presented with worsening abdominal pain, right upper quadrant. -Right upper quadrant ultrasound done with stones in the region of gallbladder neck/cystic duct with gallbladder wall thickening and pericholecystic fluid.  Negative Murphy sign.  Hepatic steatosis. - CT abdomen and pelvis done with significant change in gallbladder when compared with prior exam.  Gallstones in the region of gallbladder neck with wall thickening and pericholecystic fluid highly suspicious for acute cholecystitis in appropriate clinical setting.  Fatty liver.  GERD.  Persistent soft tissue swelling in the region of the mons pubis stable from prior exam. - Patient seen in consultation by general surgery and patient underwent laparoscopic fenestrated subtotal cholecystectomy, laparoscopic lysis of adhesions on 12/15/2023. - Patient noted to be tolerating full liquids initially and was placed on bowel rest after patient noted to have a postop ileus..   - Patient patient with improvement with abdominal distention, no nausea, passing flatus,  having bowel movements. - Patient started on clear liquid diet per general surgery, diet advanced to full liquid diet which she tolerated and diet being advanced today to a soft diet, 12/22/2023. -Also concern for possible bile leak. -CT abdomen and pelvis (12/19/2023) which showed gallbladder wall thickening and cholelithiasis with new foci of air throughout the gallbladder lumen and possibly along the wall with pericholecystic fluid collection, concerning for cholecystitis.  Interval placement of right-sided trans abdominal drainage catheter tip terminating the right perihepatic.  Small amount of pelvic fluid and diffuse peritoneal fat stranding suggestive of infectious/inflammatory process.  Interval development of multifocal pulmonary airspace consolidation/atelectasis with associated bilateral small pleural effusions.  Subcutaneous emphysema within the ventral pelvic wall. -Keep potassium approximately 4, magnesium  approximately 2. -Continue empiric IV Flagyl  and IV Rocephin . -Mobilize. -While patient does not meet sepsis criteria, and he has no fever, minimally elevated leukocytosis of 12.8 he is on Humira  and given his immunocompromise state may prolong his antibiotic course pending his surgery and postop course.  - Per general surgery.  2.  Bile leak -Patient noted with concerns for biliary leak as output from the drain with bile with output of 405 mL on 12/20/2023. - Patient states decreased biliary output overnight.   - Patient being followed by general surgery who have consulted with GI for possible stent placement. - Patient seen in consultation by GI who are recommending ERCP for bile leak hopefully to be done today, 12/22/2023, however per general surgery due to significantly decreased biliary output recommending holding off on ERCP at this time and monitoring.  - Diet advanced per general surgery.  - General Surgery and GI following and appreciate input and recommendations.  3.   Postoperative ileus--improved -Patient noted with  abdominal distention, nausea, hypoactive bowel sounds. - Abdominal films ordered earlier in the hospitalization, consistent with postoperative ileus. -Patient with clinical improvement with abdominal pain.   - Patient underwent repeat CT abdomen and pelvis (12/19/2023) which showed gallbladder wall thickening and cholelithiasis with new foci of air throughout the gallbladder lumen and possibly along the wall with pericholecystic fluid collection, concerning for cholecystitis.  Interval placement of right-sided trans abdominal drainage catheter tip terminating the right perihepatic.  Small amount of pelvic fluid and diffuse peritoneal fat stranding suggestive of infectious/inflammatory process.  Interval development of multifocal pulmonary airspace consolidation/atelectasis with associated bilateral small pleural effusions.  Subcutaneous emphysema within the ventral pelvic wall.  - CT scan was reviewed by general surgery who felt changes secondary to postoperative changes and will monitor for now.  -Patient with clinical improvement, having flatus, having bowel movements and is started on clears and diet advanced to full liquids which patient tolerated  - Diet advance to soft diet today per general surgery.  - Keep electrolytes repleted for potassium approximately 4, magnesium  approximately 2.  - Mobilize. - Pain management per general surgery.  4.  Crohn's disease without acute flare -Stable. - Patient on Humira  not due until 12/26/2023. - Outpatient follow-up with GI.  5.  Generalized anxiety - Patient currently with a postop ileus and as needed Xanax  was discontinued and patient placed on IV Versed .   - Patient however stated IV Versed  was too short acting and would prefer IV Ativan .   - Due to current shortage of IV Ativan  patient was placed on IV Valium  as needed.   - Patient now with bowel movements and flatus and started on a diet per general  surgery who have resumed patient's home regimen of Xanax  as needed.   -IV Valium  discontinued.  6.  Acute renal failure -Likely secondary to prerenal azotemia. - Improved with hydration.   7.  Hyponatremia - Improved with hydration.    8.  GERD - Change IV PPI to oral PPI.      DVT prophylaxis: Heparin  Code Status: Full Family Communication: Updated patient and sister at bedside.  Disposition: Likely home when clinically improved and cleared by general surgery.  Status is: Inpatient Remains inpatient appropriate because: Severity of illness.   Consultants:  General Surgery: Dr. [name] 12/14/2023 Gastroenterology: Dr. Kriss 12/21/2023  Procedures:  CT abdomen and pelvis 12/14/2023, 12/19/2023 Right upper quadrant ultrasound 12/14/2023 Laparoscopic fenestrated subtotal cholecystectomy, laparoscopic lysis of adhesions per general surgery: Dr. Ebbie 12/15/2023 Abdominal films 12/18/2023  Antimicrobials:  Anti-infectives (From admission, onward)    Start     Dose/Rate Route Frequency Ordered Stop   12/22/23 0815  ampicillin-sulbactam (UNASYN) 1.5 g in sodium chloride  0.9 % 100 mL IVPB        1.5 g 200 mL/hr over 30 Minutes Intravenous  Once 12/22/23 0729     12/15/23 1630  cefTRIAXone  (ROCEPHIN ) 2 g in sodium chloride  0.9 % 100 mL IVPB        2 g 200 mL/hr over 30 Minutes Intravenous Every 24 hours 12/15/23 0103     12/15/23 0600  metroNIDAZOLE  (FLAGYL ) IVPB 500 mg        500 mg 100 mL/hr over 60 Minutes Intravenous Every 12 hours 12/15/23 0103     12/14/23 1615  cefTRIAXone  (ROCEPHIN ) 2 g in sodium chloride  0.9 % 100 mL IVPB        2 g 200 mL/hr over 30 Minutes Intravenous  Once 12/14/23 1602 12/14/23 1658   12/14/23  1615  metroNIDAZOLE  (FLAGYL ) IVPB 500 mg        500 mg 100 mL/hr over 60 Minutes Intravenous  Once 12/14/23 1602 12/14/23 1910         Subjective: Patient sitting up in bed.  Denies any chest pain or significant shortness of breath.  No nausea or  vomiting.  Passing flatus.  Still having bowel movements.  Noted to have tolerated full liquids yesterday.  States bilious drainage from JP drain has decreased overnight.  Currently n.p.o. awaiting possible GI procedure.   Objective: Vitals:   12/21/23 1330 12/21/23 2135 12/22/23 0145 12/22/23 0610  BP: (!) 142/85 (!) 137/93 (!) 143/97 (!) 142/95  Pulse: 85 83 78 80  Resp: 18 18 14 16   Temp: 98.6 F (37 C) 99.2 F (37.3 C) 98.2 F (36.8 C) 98.4 F (36.9 C)  TempSrc: Oral Oral Oral Oral  SpO2: 94% 96% 95% 93%  Weight:      Height:        Intake/Output Summary (Last 24 hours) at 12/22/2023 0858 Last data filed at 12/22/2023 9375 Gross per 24 hour  Intake 1254.27 ml  Output 180 ml  Net 1074.27 ml   Filed Weights   12/15/23 0717  Weight: 103 kg    Examination:  General exam: NAD. Respiratory system: Clear to auscultation bilaterally.  No wheezes, no crackles, no rhonchi.  Fair air movement.  Speaking in full sentences. Cardiovascular system: RRR no murmurs rubs or gallops.  No JVD.  No pitting lower extremity edema. Gastrointestinal system: Abdomen is soft, mildly distended, less tender to palpation in the right upper quadrant.  Right JP drain in place with decreased bilious drainage. Central nervous system: Alert and oriented. No focal neurological deficits. Extremities: Symmetric 5 x 5 power. Skin: No rashes, lesions or ulcers Psychiatry: Judgement and insight appear normal. Mood & affect appropriate.     Data Reviewed: I have personally reviewed following labs and imaging studies  CBC: Recent Labs  Lab 12/19/23 0423 12/19/23 2339 12/20/23 0451 12/21/23 0440 12/22/23 0429  WBC 8.9 9.5 10.5 8.5 8.3  NEUTROABS  --  7.4 7.8* 6.3 6.2  HGB 12.8* 11.8* 12.9* 12.1* 12.3*  HCT 40.9 39.8 41.4 38.1* 39.2  MCV 91.3 92.1 91.4 89.6 90.5  PLT 215 259 293 272 301    Basic Metabolic Panel: Recent Labs  Lab 12/18/23 0427 12/19/23 0423 12/20/23 0451 12/21/23 0440  12/22/23 0429  NA 130* 132* 132* 131* 133*  K 3.7 3.6 3.6 3.8 3.5  CL 96* 99 97* 96* 98  CO2 27 23 26 27 23   GLUCOSE 121* 126* 120* 143* 124*  BUN 8 7 7  5* <5*  CREATININE 1.18 0.94 0.96 0.92 0.64  CALCIUM 7.4* 7.3* 7.7* 7.8* 8.1*  MG 2.0 1.9 2.1 2.0 1.9    GFR: Estimated Creatinine Clearance: 125.3 mL/min (by C-G formula based on SCr of 0.64 mg/dL).  Liver Function Tests: Recent Labs  Lab 12/18/23 0427 12/19/23 0423 12/20/23 0451 12/21/23 0440 12/22/23 0429  AST 18 17 19 21 18   ALT 14 11 13 13 12   ALKPHOS 53 60 77 77 81  BILITOT 1.2 0.8 0.9 0.8 0.7  PROT 6.6 5.6* 7.0 6.8 6.9  ALBUMIN 2.2* 1.9* 2.2* 2.2* 2.5*    CBG: No results for input(s): GLUCAP in the last 168 hours.   Recent Results (from the past 240 hours)  MRSA Next Gen by PCR, Nasal     Status: Abnormal   Collection Time: 12/14/23  9:05 PM  Specimen: Nasal Mucosa; Nasal Swab  Result Value Ref Range Status   MRSA by PCR Next Gen DETECTED (A) NOT DETECTED Final    Comment: RESULT CALLED TO, READ BACK BY AND VERIFIED WITH: B. PAUDEA, RN [phone] 12/15/23 BY ALONSO CORDS (NOTE) The GeneXpert MRSA Assay (FDA approved for NASAL specimens only), is one component of a comprehensive MRSA colonization surveillance program. It is not intended to diagnose MRSA infection nor to guide or monitor treatment for MRSA infections. Test performance is not FDA approved in patients less than 93 years old. Performed at [facility], [address] [place], [place] [address]   Culture, blood (x 2)     Status: None (Preliminary result)   Collection Time: 12/19/23 11:39 PM   Specimen: BLOOD RIGHT ARM  Result Value Ref Range Status   Specimen Description   Final    BLOOD RIGHT ARM Performed at [facility], [address] [place], [place] [address]    Special Requests   Final    BOTTLES DRAWN AEROBIC AND ANAEROBIC Blood Culture results may not be optimal due to an inadequate volume of blood received  in culture bottles Performed at [facility], [address] [place], [place] [address]    Culture   Final    NO GROWTH 1 DAY Performed at [facility], [address] [place], [place] [address]    Report Status PENDING  Incomplete  Culture, blood (x 2)     Status: None (Preliminary result)   Collection Time: 12/19/23 11:39 PM   Specimen: BLOOD RIGHT ARM  Result Value Ref Range Status   Specimen Description   Final    BLOOD RIGHT ARM Performed at [facility], [address] [place], [place] [address]    Special Requests   Final    BOTTLES DRAWN AEROBIC AND ANAEROBIC Blood Culture adequate volume Performed at [facility], [address] [place], [place] [address]    Culture   Final    NO GROWTH 1 DAY Performed at [facility], [address], [place], [place] [address]    Report Status PENDING  Incomplete         Radiology Studies: No results found.       Scheduled Meds:  feeding supplement  237 mL Oral BID BM   mupirocin  ointment   Nasal BID   pantoprazole  (PROTONIX ) IV  40 mg Intravenous Q12H   Continuous Infusions:  ampicillin-sulbactam (UNASYN) 1.5 g in sodium chloride  0.9 % 100 mL IVPB Stopped (12/22/23 0845)   cefTRIAXone  (ROCEPHIN )  IV 2 g (12/21/23 1531)   metronidazole  500 mg (12/22/23 0609)     LOS: 8 days    Time spent: 40 minutes    Toribio Hummer, MD [facility]   To contact the attending provider between 7A-7P or the covering provider during after hours 7P-7A, please log into the web site [email or web address] and access using universal [place] password for that web site. If you do not have the password, please call the hospital operator.  12/22/2023, 8:58 AM

## 2023-12-22 NOTE — Progress Notes (Signed)
 Progress Note  7 Days Post-Op  Subjective: Patient feels well today.  + BM/Flatus and no nausea with FLD.  JP output has significantly decreased to about 10cc since last night.    Objective: Vital signs in last 24 hours: Temp:  [98.2 F (36.8 C)-99.2 F (37.3 C)] 98.4 F (36.9 C) (08/18 0610) Pulse Rate:  [78-85] 80 (08/18 0610) Resp:  [14-18] 16 (08/18 0610) BP: (137-143)/(85-97) 142/95 (08/18 0610) SpO2:  [93 %-96 %] 93 % (08/18 0610) Weight:  [103.6 kg] 103.6 kg (08/18 0804) Last BM Date : 12/21/23  Intake/Output from previous day: 08/17 0701 - 08/18 0700 In: 1254.3 [P.O.:960; IV Piggyback:294.3] Out: 290 [Drains:290] Intake/Output this shift: Total I/O In: -  Out: 5 [Drains:5]  PE: General: Pleasant, male who is laying in bed in NAD. Lungs: Respiratory effort nonlabored Abd: Soft. Min tenderness to palpation. Incisions C/D/I. Drain present of right abdomen with minimal bilious output, about 10cc in last 12 hrs. Psych: A&Ox3 with an appropriate affect.    Lab Results:  Recent Labs    12/21/23 0440 12/22/23 0429  WBC 8.5 8.3  HGB 12.1* 12.3*  HCT 38.1* 39.2  PLT 272 301   BMET Recent Labs    12/21/23 0440 12/22/23 0429  NA 131* 133*  K 3.8 3.5  CL 96* 98  CO2 27 23  GLUCOSE 143* 124*  BUN 5* <5*  CREATININE 0.92 0.64  CALCIUM 7.8* 8.1*   PT/INR No results for input(s): LABPROT, INR in the last 72 hours. CMP     Component Value Date/Time   NA 133 (L) 12/22/2023 0429   K 3.5 12/22/2023 0429   CL 98 12/22/2023 0429   CO2 23 12/22/2023 0429   GLUCOSE 124 (H) 12/22/2023 0429   BUN <5 (L) 12/22/2023 0429   CREATININE 0.64 12/22/2023 0429   CALCIUM 8.1 (L) 12/22/2023 0429   PROT 6.9 12/22/2023 0429   ALBUMIN 2.5 (L) 12/22/2023 0429   AST 18 12/22/2023 0429   ALT 12 12/22/2023 0429   ALKPHOS 81 12/22/2023 0429   BILITOT 0.7 12/22/2023 0429   GFRNONAA >60 12/22/2023 0429   GFRAA >90 09/19/2013 0450   Lipase     Component Value  Date/Time   LIPASE 29 12/14/2023 1347       Studies/Results: No results found.   Anti-infectives: Anti-infectives (From admission, onward)    Start     Dose/Rate Route Frequency Ordered Stop   12/22/23 0815  ampicillin-sulbactam (UNASYN) 1.5 g in sodium chloride  0.9 % 100 mL IVPB        1.5 g 200 mL/hr over 30 Minutes Intravenous  Once 12/22/23 0729     12/15/23 1630  cefTRIAXone  (ROCEPHIN ) 2 g in sodium chloride  0.9 % 100 mL IVPB        2 g 200 mL/hr over 30 Minutes Intravenous Every 24 hours 12/15/23 0103     12/15/23 0600  metroNIDAZOLE  (FLAGYL ) IVPB 500 mg        500 mg 100 mL/hr over 60 Minutes Intravenous Every 12 hours 12/15/23 0103     12/14/23 1615  cefTRIAXone  (ROCEPHIN ) 2 g in sodium chloride  0.9 % 100 mL IVPB        2 g 200 mL/hr over 30 Minutes Intravenous  Once 12/14/23 1602 12/14/23 1658   12/14/23 1615  metroNIDAZOLE  (FLAGYL ) IVPB 500 mg        500 mg 100 mL/hr over 60 Minutes Intravenous  Once 12/14/23 1602 12/14/23 1910  Assessment/Plan POD7: S/P Procedure laparoscopic fenestrated subtotal cholecystectomy by Dr. Donnice Bury on 12/15/2023 -DG Abd 2 views from 8/14 showed ileus, but improving -tolerating FLD, will try soft diet today given good bowel function -Drain output dramatically reduced.  D/w GI about hold on ERCP given dramatic reduction.  Will continue to monitor     FEN: soft VTE: Heparin  injections ID: Ceftriaxone  and metronidazole     LOS: 8 days    Burnard FORBES Banter, [facility] 12/22/2023, 10:59 AM Please see Amion for pager number during day hours 7:00am-4:30pm

## 2023-12-22 NOTE — Progress Notes (Signed)
 Pt pushed call bell. Nurse and tech sitting outside of his room, with his door open (his preference). Nurse tech walks in his room. Nurse tech then told nurse that the pt reports having trouble breathing and is coughing.  Nurse asked nurse tech to get a set of vital signs on the pt. Nurse enters room with the pt and the nurse tech. Nurse sees that the vital signs are stable with oxygen sats on room air 95% and stable. Pt is warm, dry, no visible distress. Prior to him calling out, nurse hadn't heard the pt coughing. However, did hear the pt doing soft coughs as if he is clearing throat. Nurse asked him if he was using the incentive spirometer, that was encouraged earlier tonight. He stated he had. Nurse handed him the incentive spirometer to him and witnessed him using it a few times. He was able to get it up to 1500. Encouraged continued use when awake, but to pace himself during use. Nurse explained to use of the incentive spirometer and the benefits to him, again. Lungs are clear bilaterally. Current vital signs: temp 98.2, bp-143/97, MAP 110, pulse 78, resp 14, sats on room air 95%.

## 2023-12-22 NOTE — Progress Notes (Signed)
 Subjective: No abdominal pain.  Objective: Vital signs in last 24 hours: Temp:  [98.2 F (36.8 C)-99.2 F (37.3 C)] 98.4 F (36.9 C) (08/18 0610) Pulse Rate:  [78-85] 80 (08/18 0610) Resp:  [14-18] 16 (08/18 0610) BP: (137-143)/(85-97) 142/95 (08/18 0610) SpO2:  [93 %-96 %] 93 % (08/18 0610) Weight:  [103.6 kg] 103.6 kg (08/18 0804) Weight change:  Last BM Date : 12/21/23  PE: GEN:  NAD ABD:  Soft, non-tender, scant bile RUQ JP drain NEURO:  No encephalopathy  Lab Results: CBC    Component Value Date/Time   WBC 8.3 12/22/2023 0429   RBC 4.33 12/22/2023 0429   HGB 12.3 (L) 12/22/2023 0429   HCT 39.2 12/22/2023 0429   PLT 301 12/22/2023 0429   MCV 90.5 12/22/2023 0429   MCH 28.4 12/22/2023 0429   MCHC 31.4 12/22/2023 0429   RDW 14.0 12/22/2023 0429   LYMPHSABS 1.1 12/22/2023 0429   MONOABS 0.9 12/22/2023 0429   EOSABS 0.1 12/22/2023 0429   BASOSABS 0.0 12/22/2023 0429  CMP     Component Value Date/Time   NA 133 (L) 12/22/2023 0429   K 3.5 12/22/2023 0429   CL 98 12/22/2023 0429   CO2 23 12/22/2023 0429   GLUCOSE 124 (H) 12/22/2023 0429   BUN <5 (L) 12/22/2023 0429   CREATININE 0.64 12/22/2023 0429   CALCIUM 8.1 (L) 12/22/2023 0429   PROT 6.9 12/22/2023 0429   ALBUMIN 2.5 (L) 12/22/2023 0429   AST 18 12/22/2023 0429   ALT 12 12/22/2023 0429   ALKPHOS 81 12/22/2023 0429   BILITOT 0.7 12/22/2023 0429   GFRNONAA >60 12/22/2023 0429    Assessment:   Bile leak after subtotal cholecystectomy.  JP drainage significantly down.  Plan:   Monitor expectantly.  It may be the leak is resolving on its own.  Case reviewed with surgical team. [facility] will follow.   BURNETTE ELSIE HERO 12/22/2023, 12:21 PM   Cell [phone] If no answer or after 5 PM call [phone]

## 2023-12-23 DIAGNOSIS — F411 Generalized anxiety disorder: Secondary | ICD-10-CM | POA: Diagnosis not present

## 2023-12-23 DIAGNOSIS — K810 Acute cholecystitis: Secondary | ICD-10-CM | POA: Diagnosis not present

## 2023-12-23 DIAGNOSIS — K50819 Crohn's disease of both small and large intestine with unspecified complications: Secondary | ICD-10-CM | POA: Diagnosis not present

## 2023-12-23 DIAGNOSIS — E871 Hypo-osmolality and hyponatremia: Secondary | ICD-10-CM | POA: Diagnosis not present

## 2023-12-23 LAB — CBC
HCT: 37.5 % — ABNORMAL LOW (ref 39.0–52.0)
Hemoglobin: 12.1 g/dL — ABNORMAL LOW (ref 13.0–17.0)
MCH: 28.5 pg (ref 26.0–34.0)
MCHC: 32.3 g/dL (ref 30.0–36.0)
MCV: 88.4 fL (ref 80.0–100.0)
Platelets: 323 K/uL (ref 150–400)
RBC: 4.24 MIL/uL (ref 4.22–5.81)
RDW: 14 % (ref 11.5–15.5)
WBC: 9.2 K/uL (ref 4.0–10.5)
nRBC: 0 % (ref 0.0–0.2)

## 2023-12-23 LAB — BASIC METABOLIC PANEL WITH GFR
Anion gap: 9 (ref 5–15)
BUN: <5 mg/dL — ABNORMAL LOW (ref 6–20)
CO2: 24 mmol/L (ref 22–32)
Calcium: 7.9 mg/dL — ABNORMAL LOW (ref 8.9–10.3)
Chloride: 99 mmol/L (ref 98–111)
Creatinine, Ser: 0.84 mg/dL (ref 0.61–1.24)
GFR, Estimated: >60 mL/min (ref >60)
Glucose, Bld: 138 mg/dL — ABNORMAL HIGH (ref 70–99)
Potassium: 3.3 mmol/L — ABNORMAL LOW (ref 3.5–5.1)
Sodium: 132 mmol/L — ABNORMAL LOW (ref 135–145)

## 2023-12-23 LAB — MAGNESIUM: Magnesium: 1.9 mg/dL (ref 1.7–2.4)

## 2023-12-23 MED ORDER — PANTOPRAZOLE SODIUM 40 MG PO TBEC: 40.0000 mg | DELAYED_RELEASE_TABLET | Freq: Every day | ORAL | 1 refills | Status: AC

## 2023-12-23 MED ORDER — ONDANSETRON 4 MG PO TBDP: 4.0000 mg | ORAL_TABLET | Freq: Three times a day (TID) | ORAL | 0 refills | Status: AC | PRN

## 2023-12-23 MED ORDER — POTASSIUM CHLORIDE CRYS ER 20 MEQ PO TBCR
40.0000 meq | EXTENDED_RELEASE_TABLET | ORAL | Status: DC
  Administered 2023-12-23: 40 meq via ORAL
  Filled 2023-12-23: qty 2

## 2023-12-23 MED ORDER — ENSURE PLUS HIGH PROTEIN PO LIQD: 237.0000 mL | Freq: Two times a day (BID) | ORAL | Status: AC

## 2023-12-23 NOTE — Progress Notes (Signed)
 Progress Note  8 Days Post-Op  Subjective: Patient feels well today.  + BM/Flatus and no nausea with soft diet.  Drain output still minimal  Objective: Vital signs in last 24 hours: Temp:  [97.8 F (36.6 C)-98.6 F (37 C)] 97.8 F (36.6 C) (08/19 0542) Pulse Rate:  [77-78] 77 (08/19 0542) Resp:  [16-18] 18 (08/19 0542) BP: (136-151)/(81-88) 146/82 (08/19 0542) SpO2:  [93 %-97 %] 96 % (08/19 0542) Weight:  [103.2 kg] 103.2 kg (08/19 0705) Last BM Date : 12/22/23  Intake/Output from previous day: 08/18 0701 - 08/19 0700 In: 2070.1 [P.O.:1840; IV Piggyback:230.1] Out: 15 [Drains:15] Intake/Output this shift: No intake/output data recorded.  PE: General: Pleasant, male who is laying in bed in NAD. Lungs: Respiratory effort nonlabored Abd: Soft. Min tenderness to palpation. Incisions C/D/I. Drain present of right abdomen with minimal bilious output, about 15cc in last 24 hrs Psych: A&Ox3 with an appropriate affect.    Lab Results:  Recent Labs    12/22/23 0429 12/23/23 0520  WBC 8.3 9.2  HGB 12.3* 12.1*  HCT 39.2 37.5*  PLT 301 323   BMET Recent Labs    12/22/23 0429 12/23/23 0520  NA 133* 132*  K 3.5 3.3*  CL 98 99  CO2 23 24  GLUCOSE 124* 138*  BUN <5* <5*  CREATININE 0.64 0.84  CALCIUM 8.1* 7.9*   PT/INR No results for input(s): LABPROT, INR in the last 72 hours. CMP     Component Value Date/Time   NA 132 (L) 12/23/2023 0520   K 3.3 (L) 12/23/2023 0520   CL 99 12/23/2023 0520   CO2 24 12/23/2023 0520   GLUCOSE 138 (H) 12/23/2023 0520   BUN <5 (L) 12/23/2023 0520   CREATININE 0.84 12/23/2023 0520   CALCIUM 7.9 (L) 12/23/2023 0520   PROT 6.9 12/22/2023 0429   ALBUMIN 2.5 (L) 12/22/2023 0429   AST 18 12/22/2023 0429   ALT 12 12/22/2023 0429   ALKPHOS 81 12/22/2023 0429   BILITOT 0.7 12/22/2023 0429   GFRNONAA >60 12/23/2023 0520   GFRAA >90 09/19/2013 0450   Lipase     Component Value Date/Time   LIPASE 29 12/14/2023 1347        Studies/Results: No results found.   Anti-infectives: Anti-infectives (From admission, onward)    Start     Dose/Rate Route Frequency Ordered Stop   12/22/23 1930  metroNIDAZOLE  (FLAGYL ) IVPB 500 mg        500 mg 100 mL/hr over 60 Minutes Intravenous  Once 12/22/23 1841 12/22/23 1944   12/22/23 0815  ampicillin-sulbactam (UNASYN) 1.5 g in sodium chloride  0.9 % 100 mL IVPB        1.5 g 200 mL/hr over 30 Minutes Intravenous  Once 12/22/23 0729     12/15/23 1630  cefTRIAXone  (ROCEPHIN ) 2 g in sodium chloride  0.9 % 100 mL IVPB        2 g 200 mL/hr over 30 Minutes Intravenous Every 24 hours 12/15/23 0103     12/15/23 0600  metroNIDAZOLE  (FLAGYL ) IVPB 500 mg        500 mg 100 mL/hr over 60 Minutes Intravenous Every 12 hours 12/15/23 0103     12/14/23 1615  cefTRIAXone  (ROCEPHIN ) 2 g in sodium chloride  0.9 % 100 mL IVPB        2 g 200 mL/hr over 30 Minutes Intravenous  Once 12/14/23 1602 12/14/23 1658   12/14/23 1615  metroNIDAZOLE  (FLAGYL ) IVPB 500 mg  500 mg 100 mL/hr over 60 Minutes Intravenous  Once 12/14/23 1602 12/14/23 1910        Assessment/Plan POD7: S/P Procedure laparoscopic fenestrated subtotal cholecystectomy by Dr. Donnice Bury on 12/15/2023 -DG Abd 2 views from 8/14 showed ileus, but improving -tolerating soft. -Drain output dramatically reduced.  Hold on ERCP.  Cont with drain and let him go home with this -no further abx therapy warranted from surgical standpoint -patient is doing well and stable for DC home.  Follow up and instructions have been discussed with patient.  D/w primary service.     FEN: soft VTE: Heparin  injections ID: Ceftriaxone  and metronidazole     LOS: 9 days    Burnard FORBES Banter, [facility] 12/23/2023, 10:41 AM Please see Amion for pager number during day hours 7:00am-4:30pm

## 2023-12-23 NOTE — Progress Notes (Signed)
 Subjective: No abdominal pain.  Objective: Vital signs in last 24 hours: Temp:  [97.8 F (36.6 C)-98.6 F (37 C)] 97.8 F (36.6 C) (08/19 0542) Pulse Rate:  [77-78] 77 (08/19 0542) Resp:  [16-18] 18 (08/19 0542) BP: (136-151)/(81-88) 146/82 (08/19 0542) SpO2:  [93 %-97 %] 96 % (08/19 0542) Weight:  [103.2 kg] 103.2 kg (08/19 0705) Weight change:  Last BM Date : 12/22/23  PE: GEN: NAD HEENT:  Anicteric sclera ABD:  JP < 10cc/24 hours NEURO:  No encephalopathy  Lab Results: CBC    Component Value Date/Time   WBC 9.2 12/23/2023 0520   RBC 4.24 12/23/2023 0520   HGB 12.1 (L) 12/23/2023 0520   HCT 37.5 (L) 12/23/2023 0520   PLT 323 12/23/2023 0520   MCV 88.4 12/23/2023 0520   MCH 28.5 12/23/2023 0520   MCHC 32.3 12/23/2023 0520   RDW 14.0 12/23/2023 0520   LYMPHSABS 1.1 12/22/2023 0429   MONOABS 0.9 12/22/2023 0429   EOSABS 0.1 12/22/2023 0429   BASOSABS 0.0 12/22/2023 0429  CMP     Component Value Date/Time   NA 132 (L) 12/23/2023 0520   K 3.3 (L) 12/23/2023 0520   CL 99 12/23/2023 0520   CO2 24 12/23/2023 0520   GLUCOSE 138 (H) 12/23/2023 0520   BUN <5 (L) 12/23/2023 0520   CREATININE 0.84 12/23/2023 0520   CALCIUM 7.9 (L) 12/23/2023 0520   PROT 6.9 12/22/2023 0429   ALBUMIN 2.5 (L) 12/22/2023 0429   AST 18 12/22/2023 0429   ALT 12 12/22/2023 0429   ALKPHOS 81 12/22/2023 0429   BILITOT 0.7 12/22/2023 0429   GFRNONAA >60 12/23/2023 0520   Assessment:  Bile leak after subtotal cholecystectomy. JP drainage significantly down.   Plan:   No plans for ERCP.  Looks like plan for discharge home today. Patient to follow-up with surgical team. No GI follow-up needed at this time. [facility] will sign-off; please call with questions; thank you for the consultation.   BURNETTE ELSIE HERO 12/23/2023, 11:35 AM   Cell [phone] If no answer or after 5 PM call [phone]

## 2023-12-23 NOTE — Discharge Instructions (Signed)

## 2023-12-23 NOTE — Progress Notes (Signed)
 Patient discharged home, IV removed, discharge paperwork provided and explained to patient as well as patient's sister, both patient and patient's sister verbalized understanding, primary RN provided patient with JP drain care and education, patient verbalized understanding.

## 2023-12-23 NOTE — Discharge Summary (Signed)
 Physician Discharge Summary  Greg [name] FMW:[id number] DOB: 08/04/1965 DOA: 12/14/2023  PCP: Greg Elsie [name], MD  Admit date: 12/14/2023 Discharge date: 12/23/2023  Time spent: 60 minutes  Recommendations for Outpatient Follow-up:  Follow-up with Dr. Ebbie, general surgery 01/06/2024. Follow-up with Greg Elsie [name], MD in 2 weeks.  On follow-up patient will need a basic metabolic profile, magnesium  level done to follow-up on electrolytes and renal function.   Discharge Diagnoses:  Principal Problem:   Acute cholecystitis Active Problems:   Crohn's disease, question perirectal involvement   Gastroesophageal reflux disease   Generalized anxiety disorder   Insomnia   Venous insufficiency of lower extremity   Immunosuppression due to drug therapy (Humira  for Crohns disease)   Hyponatremia   Postoperative ileus (HCC)   Discharge Condition: Stable and improved.  Diet recommendation: Soft diet  Filed Weights   12/15/23 0717 12/22/23 0804 12/23/23 0705  Weight: 103 kg 103.6 kg 103.2 kg    History of present illness:  HPI per Dr. Lue Marcos Briana [name] is a 58 y.o. male with medical history significant of Chrons disease who presents back to our facility after worsening abdominal pain, right upper quadrant.  He apparently had been evaluated in our system 2 days ago but per report refused admission at that time, CT chest abdomen pelvis they are done per dissection protocol was without acute findings, gallbladder at that time was unremarkable other than gallstones, CT at this intake shows pericholecystic fluid and wall thickening of the gallbladder itself.  ED discussed case with Dr. [name] general surgery, hospitalist was called for admission. Patient does not meet sepsis criteria, IV antibiotics and pain medications initiated at intake.  N.p.o. with planned surgical intervention tomorrow at 1125.   ED Course: Unremarkable, pain well-controlled with 0.5 mg Dilaudid  x 1  IV, CT abdomen pelvis as above with gallbladder changes and notable right upper quadrant pain.  Hospital Course:  #1 acute cholecystitis, POA -Patient noted to have presented with worsening abdominal pain, right upper quadrant. -Right upper quadrant ultrasound done with stones in the region of gallbladder neck/cystic duct with gallbladder wall thickening and pericholecystic fluid.  Negative Murphy sign.  Hepatic steatosis. - CT abdomen and pelvis done with significant change in gallbladder when compared with prior exam.  Gallstones in the region of gallbladder neck with wall thickening and pericholecystic fluid highly suspicious for acute cholecystitis in appropriate clinical setting.  Fatty liver.  GERD.  Persistent soft tissue swelling in the region of the mons pubis stable from prior exam. - Patient seen in consultation by general surgery and patient underwent laparoscopic fenestrated subtotal cholecystectomy, laparoscopic lysis of adhesions on 12/15/2023. - Patient noted to develop a postop ileus and subsequently placed on bowel rest.  -Patient improved clinically was having bowel movements and passing flatus. -Patient started on a clear liquid diet and diet advanced to a soft diet she tolerated. -Also concern for possible bile leak during the hospitalization. -CT abdomen and pelvis (12/19/2023) which showed gallbladder wall thickening and cholelithiasis with new foci of air throughout the gallbladder lumen and possibly along the wall with pericholecystic fluid collection, concerning for cholecystitis.  Interval placement of right-sided trans abdominal drainage catheter tip terminating the right perihepatic.  Small amount of pelvic fluid and diffuse peritoneal fat stranding suggestive of infectious/inflammatory process.  Interval development of multifocal pulmonary airspace consolidation/atelectasis with associated bilateral small pleural effusions.  Subcutaneous emphysema within the ventral pelvic  wall. - Electrolytes repleted.   - Patient received IV  Flagyl  and IV Rocephin  throughout the hospitalization and was followed by general surgery no further antibiotics needed on discharge.   - Patient improved clinically and patient be discharged in stable and improved condition.   - Outpatient follow-up with general surgery.     2.  Bile leak -Patient noted with concerns for biliary leak as output from the drain with bile with output of 405 mL on 12/20/2023. - Patient followed by general surgery who consulted GI for possible stent placement. - Patient seen in consultation by GI who initially recommended ERCP for bile leak hopefully to be done, 12/22/2023, however per general surgery due to significantly decreased biliary output ERCP was canceled. - Patient improved clinically, GI signed off, and general surgery cleared patient for discharge.   - Outpatient follow-up with general surgery and drain clinic..    3.  Postoperative ileus--improved -Patient noted with abdominal distention, nausea, hypoactive bowel sounds during the hospitalization. - Abdominal films ordered earlier in the hospitalization, consistent with postoperative ileus. - Patient underwent repeat CT abdomen and pelvis (12/19/2023) which showed gallbladder wall thickening and cholelithiasis with new foci of air throughout the gallbladder lumen and possibly along the wall with pericholecystic fluid collection, concerning for cholecystitis.  Interval placement of right-sided trans abdominal drainage catheter tip terminating the right perihepatic.  Small amount of pelvic fluid and diffuse peritoneal fat stranding suggestive of infectious/inflammatory process.  Interval development of multifocal pulmonary airspace consolidation/atelectasis with associated bilateral small pleural effusions.  Subcutaneous emphysema within the ventral pelvic wall.  - CT scan was reviewed by general surgery who felt changes secondary to postoperative changes and  will monitor for now.  -Patient improved clinically, started having bowel movements and passing flatus and subsequently started on a clear liquid diet and diet advanced to soft diet which patient tolerated.   - Electrolytes were repleted.   - Patient followed by general surgery throughout the hospitalization patient cleared for discharge.   - Outpatient follow-up with general surgery.    4.  Crohn's disease without acute flare -Stable. - Patient on Humira  not due until 12/26/2023. - Outpatient follow-up with GI.   5.  Generalized anxiety - Patient noted during the hospitalization with a postop ileus and as needed Xanax  was discontinued and patient placed on IV Versed .   - Patient underwent trial of IV Versed  however too short acting, patient underwent trial of IV Valium  and subsequently transition back to home regimen Xanax  as patient improved clinically and was started on diet.  - Outpatient follow-up with PCP.     6.  Acute renal failure -Likely secondary to prerenal azotemia. - Resolved by day of discharge.    7.  Hyponatremia - Improved with hydration.     8.  GERD - Patient maintained on PPI during the hospitalization.         Procedures: CT abdomen and pelvis 12/14/2023, 12/19/2023 Right upper quadrant ultrasound 12/14/2023 Laparoscopic fenestrated subtotal cholecystectomy, laparoscopic lysis of adhesions per general surgery: Dr. Ebbie 12/15/2023 Abdominal films 12/18/2023    Consultations: General Surgery: Dr. [name] 12/14/2023 Gastroenterology: Dr. Kriss 12/21/2023  Discharge Exam: Vitals:   12/22/23 2218 12/23/23 0542  BP: 136/81 (!) 146/82  Pulse: 78 77  Resp: 17 18  Temp: 98.6 F (37 C) 97.8 F (36.6 C)  SpO2: 93% 96%    General: NAD Cardiovascular: Regular rate rhythm no murmurs rubs or gallops.  No JVD.  No lower extremity edema. Respiratory: Clear to auscultation bilaterally.  No wheezes, no crackles, no rhonchi.  Fair air movement.  Speaking full  sentences.  Discharge Instructions   Discharge Instructions     Diet general   Complete by: As directed    Soft diet   Increase activity slowly   Complete by: As directed       Allergies as of 12/23/2023       Reactions   Nsaids Other (See Comments)   Crohn's disease = NO NSAIDs        Medication List     TAKE these medications    acetaminophen  500 MG tablet Commonly known as: TYLENOL  Take 1,000 mg by mouth every 8 (eight) hours as needed.   ALPRAZolam  0.5 MG tablet Commonly known as: XANAX  Take 0.5 mg by mouth daily as needed for anxiety.   feeding supplement Liqd Take 237 mLs by mouth 2 (two) times daily between meals. Start taking on: December 24, 2023   Humira  40 MG/0.8ML prefilled syringe Generic drug: adalimumab  Inject 40 mg into the skin every 14 (fourteen) days.   omega-3 acid ethyl esters 1 g capsule Commonly known as: LOVAZA Take 2 capsules by mouth 2 (two) times daily.   ondansetron  4 MG disintegrating tablet Commonly known as: ZOFRAN -ODT Take 1 tablet (4 mg total) by mouth every 8 (eight) hours as needed.   oxyCODONE  5 MG immediate release tablet Commonly known as: Roxicodone  Take 1 tablet (5 mg total) by mouth every 4 (four) hours as needed for severe pain (pain score 7-10).   pantoprazole  40 MG tablet Commonly known as: PROTONIX  Take 1 tablet (40 mg total) by mouth daily. What changed:  when to take this reasons to take this       Allergies  Allergen Reactions   Nsaids Other (See Comments)    Crohn's disease = NO NSAIDs    Follow-up Information     Greg [name] Cough, MD Follow up on 01/06/2024.   Specialty: General Surgery Why: 8:40am, Arrive 30 minutes prior to your appointment time, Please bring your insurance card and photo ID Contact information: [address] [place] [address] [phone]         Greg Elsie [name], MD. Schedule an appointment as soon as possible for a visit in 2 week(s).    Specialty: Internal Medicine Contact information: [address] [place] [address] [phone]                  The results of significant diagnostics from this hospitalization (including imaging, microbiology, ancillary and laboratory) are listed below for reference.    Significant Diagnostic Studies: CT ABDOMEN PELVIS W CONTRAST Result Date: 12/19/2023 CLINICAL DATA:  Abdominal pain,, history of Crohn's disease EXAM: CT ABDOMEN AND PELVIS WITH CONTRAST TECHNIQUE: Multidetector CT imaging of the abdomen and pelvis was performed using the standard protocol following bolus administration of intravenous contrast. RADIATION DOSE REDUCTION: This exam was performed according to the departmental dose-optimization program which includes automated exposure control, adjustment of the mA and/or kV according to patient size and/or use of iterative reconstruction technique. CONTRAST:  OMNIPAQUE  IOHEXOL  300 MG/ML  SOLN COMPARISON:  Abdomen radiograph December 19, 2023., CT abdomen pelvis December 14 2023 FINDINGS: Lower chest: Multifocal subsegmental atelectasis/consolidation with air bronchogram new to prior predominantly involving bilateral lower lobes. Bilateral new small pleural effusions. Hepatobiliary: Hepatomegaly measuring 22.2 cm. Hepatic steatosis. No focal enhancing liver lesion. Thick-walled gallbladder containing new foci of air within the lumen and possibly along the wall. Gallstones identified at the gallbladder neck. Small amount of fluid  is identified in perihepatic. Interval placement of the trans abdominal drainage catheter tip terminating along the lateral aspect of the right hepatic contour. Pancreas: Unremarkable. No pancreatic ductal dilatation or surrounding inflammatory changes. Spleen: Splenomegaly measuring 14.1 cm. Adrenals/Urinary Tract: Adrenal glands are unremarkable. Bilateral simple cortical cysts which does not require imaging follow-up. Kidneys are normal, without  renal calculi, or hydronephrosis. Bladder is unremarkable. Stomach/Bowel: Stomach is within normal limits. Appendix is not visualized. No evidence of bowel wall thickening, distention, or inflammatory changes. Postsurgical changes along the ascending colon. There is suggestion of a possible chronic perianal fistulous tract along the right gluteal fold without significant inflammatory changes (2/128). Vascular/Lymphatic: Diffuse peritoneal fat stranding new to prior. Small ascites. No significant lymphadenopathy. Reproductive: Mild prostatomegaly Other: Multiple punctate foci of air slight subcutaneous emphysema throughout the scrotal sac, pubic and inguinal regions. Small amount of free fluid within the pelvic cavity. Musculoskeletal: No suspicious lesion. IMPRESSION: Gallbladder wall thickening and cholelithiasis with new foci of air throughout the gallbladder lumen and possibly along the wall with pericholecystic fluid collection, concerning for cholecystitis (query emphysematous?). Interval placement of a right sided trans abdominal drainage catheter tip terminating in right perihepatic. Small amount of pelvic fluid and diffuse peritoneal fat stranding suggestive of infectious/inflammatory process, new to prior. Interval development of multifocal pulmonary airspace consolidations/atelectasis with associated bilateral small pleural effusions. Subcutaneous emphysema within the ventral pelvic wall. Suggestion of chronic perianal fistula. Hepatomegaly and hepatic steatosis. Splenomegaly Electronically Signed   By: Megan  Zare M.D.   On: 12/19/2023 16:07   DG Abd 2 Views Result Date: 12/19/2023 CLINICAL DATA:  Ileus. EXAM: ABDOMEN - 2 VIEW COMPARISON:  December 18, 2023. FINDINGS: Surgical drain is seen in right side of abdomen. No definite colonic dilatation is noted. Possible cholelithiasis. Continued small bowel dilatation is noted concerning for ileus or distal small bowel obstruction. IMPRESSION: Stable small  bowel dilatation concerning for ileus or distal small bowel obstruction. Electronically Signed   By: Lynwood Landy Raddle M.D.   On: 12/19/2023 13:21   DG Abd 2 Views Result Date: 12/18/2023 CLINICAL DATA:  Abdominal distension EXAM: ABDOMEN - 2 VIEW COMPARISON:  None Available. FINDINGS: Surgical drain in the RIGHT upper quadrant. Two gallstones noted the RIGHT upper quadrant. Mildly dilated loops of small bowel. No dynamic air-fluid levels. Gas in the LEFT colon. No gas the rectum. IMPRESSION: 1. Findings most suggestive of postoperative ileus. 2. Surgical drain RIGHT upper quadrant Electronically Signed   By: Jackquline Boxer M.D.   On: 12/18/2023 11:03   US  Abdomen Limited RUQ (LIVER/GB) Result Date: 12/14/2023 CLINICAL DATA:  Cholelithiasis. EXAM: ULTRASOUND ABDOMEN LIMITED RIGHT UPPER QUADRANT COMPARISON:  12/14/2023. FINDINGS: Gallbladder: Stones are noted at the gallbladder neck/cystic duct. There is mild gallbladder wall thickening at 4.7 mm. Pericholecystic fluid is noted. No sonographic Murphy sign noted by sonographer. Common bile duct: Diameter: 7.8 mm Liver: No focal lesion identified. Increased parenchymal echogenicity. Portal vein is patent on color Doppler imaging with normal direction of blood flow towards the liver. Other: None. IMPRESSION: 1. Stones in the region of the gallbladder neck/cystic duct with gallbladder wall thickening and pericholecystic fluid. Sonographer reports a negative Murphy sign. Correlate clinically to exclude acute cholecystitis. 2. Hepatic steatosis. Electronically Signed   By: Leita Birmingham M.D.   On: 12/14/2023 16:46   CT ABDOMEN PELVIS W CONTRAST Result Date: 12/14/2023 CLINICAL DATA:  Right-sided abdominal pain for several days EXAM: CT ABDOMEN AND PELVIS WITH CONTRAST TECHNIQUE: Multidetector CT imaging of the abdomen and  pelvis was performed using the standard protocol following bolus administration of intravenous contrast. RADIATION DOSE REDUCTION: This exam was  performed according to the departmental dose-optimization program which includes automated exposure control, adjustment of the mA and/or kV according to patient size and/or use of iterative reconstruction technique. CONTRAST:  OMNIPAQUE  IOHEXOL  300 MG/ML  SOLN COMPARISON:  12/12/2023 FINDINGS: Lower chest: Mild atelectatic changes are noted in the bases bilaterally. Fluid is noted in the distal esophagus which may be related to reflux. Hepatobiliary: Fatty infiltration of the liver is noted. The gallbladder is well distended with gallstones and significant wall thickening and mild pericholecystic fluid. These changes are consistent with acute cholecystitis in the appropriate clinical setting. Ultrasound may be helpful for further evaluation. Pancreas: Unremarkable. No pancreatic ductal dilatation or surrounding inflammatory changes. Spleen: Normal in size without focal abnormality. Adrenals/Urinary Tract: Adrenal glands are within normal limits. Bilateral simple renal cysts are again seen and stable. No follow-up is recommended. No renal calculi or obstructive changes are noted. The bladder is partially distended. Stomach/Bowel: No obstructive or inflammatory changes of the colon are noted. Postsurgical changes in the region of the proximal transverse colon are seen. Some mild stenosis is noted in this region likely related to postoperative scarring. No definitive obstructive changes are seen. The appendix has been surgically removed. Small bowel and stomach are unremarkable. Vascular/Lymphatic: No significant vascular findings are present. No enlarged abdominal or pelvic lymph nodes. Reproductive: Uterus and bilateral adnexa are unremarkable. Other: Persistent soft tissue swelling is noted over the mons pubis similar to that seen on the prior exam. No discrete abscess is seen. Stable reactive inguinal nodes are noted. No free fluid is noted. Musculoskeletal: No acute or significant osseous findings.  IMPRESSION: Significant change in the gallbladder when compared with the prior exam. Gallstones are now in the region of the gallbladder neck with wall thickening and pericholecystic fluid highly suspicious for acute cholecystitis in the appropriate clinical setting. Ultrasound may be helpful for further evaluation. Fatty liver. Esophageal reflux. Persistent soft tissue swelling in the region of the mons pubis stable from the prior exam. Some reactive adenopathy is noted. Electronically Signed   By: Oneil Devonshire M.D.   On: 12/14/2023 15:40   CT Angio Chest/Abd/Pel for Dissection W and/or Wo Contrast Result Date: 12/12/2023 CLINICAL DATA:  Aortic aneurysm suspected. Crohn's disease and abdominal pain. EXAM: CT ANGIOGRAPHY CHEST, ABDOMEN AND PELVIS TECHNIQUE: Noncontrast CT chest was performed. Multidetector CT imaging through the chest, abdomen and pelvis was performed using the standard protocol during bolus administration of intravenous contrast. Multiplanar reconstructed images and MIPs were obtained and reviewed to evaluate the vascular anatomy. RADIATION DOSE REDUCTION: This exam was performed according to the departmental dose-optimization program which includes automated exposure control, adjustment of the mA and/or kV according to patient size and/or use of iterative reconstruction technique. CONTRAST:  OMNIPAQUE  IOHEXOL  350 MG/ML SOLN COMPARISON:  CT abdomen and pelvis 02/10/2019 FINDINGS: CTA CHEST FINDINGS Cardiovascular: Preferential opacification of the thoracic aorta. No evidence of thoracic aortic aneurysm or dissection. Normal heart size. No pericardial effusion. Mediastinum/Nodes: No enlarged lymph nodes are seen. Visualized thyroid gland is within normal limits. There is diffuse distal esophageal wall thickening. Lungs/Pleura: There are small bands of atelectasis in the left lower lobe and lingula. The lungs are otherwise clear. There is no pleural effusion or pneumothorax. Musculoskeletal:  No chest wall abnormality. No acute or significant osseous findings. Review of the MIP images confirms the above findings. CTA ABDOMEN AND PELVIS FINDINGS  VASCULAR Aorta: Normal caliber aorta without aneurysm, dissection, vasculitis or significant stenosis. Celiac: Patent without evidence of aneurysm, dissection, vasculitis or significant stenosis. SMA: Patent without evidence of aneurysm, dissection, vasculitis or significant stenosis. Renals: Both renal arteries are patent without evidence of aneurysm, dissection, vasculitis, fibromuscular dysplasia or significant stenosis. IMA: Patent without evidence of aneurysm, dissection, vasculitis or significant stenosis. Inflow: Patent without evidence of aneurysm, dissection, vasculitis or significant stenosis. Veins: No obvious venous abnormality within the limitations of this arterial phase study. Review of the MIP images confirms the above findings. NON-VASCULAR Hepatobiliary: No focal liver abnormality is seen. No gallstones, gallbladder wall thickening, or biliary dilatation. Pancreas: Unremarkable. No pancreatic ductal dilatation or surrounding inflammatory changes. Spleen: Normal in size without focal abnormality. Adrenals/Urinary Tract: There is a 5.7 cm cyst in the right kidney. Additional smaller cyst is seen in the superior pole the right kidney. There is a 4.1 cm cyst in the left kidney. There is no hydronephrosis or perinephric fat stranding. The adrenal glands and bladder are within normal limits. Stomach/Bowel: Stomach is within normal limits. Appendix is not visualized. No evidence of bowel wall thickening, distention, or inflammatory changes. Lymphatic: There are mildly enlarged bilateral inguinal lymph nodes measuring up to 13 mm. No other enlarged abdominal or pelvic lymph nodes are seen. Reproductive: Prostate is unremarkable. Other: There is no ascites or free air. There is no focal abdominal wall hernia. There is some infiltration and hyperdensity in  the fat anterior and inferior to the pubic symphysis extending to the level of the penis similar to prior. No soft tissue gas. No definitive fluid collection identified. Musculoskeletal: No acute fractures are seen. Review of the MIP images confirms the above findings. IMPRESSION: 1. No evidence for aortic dissection or aneurysm. 2. Diffuse distal esophageal wall thickening may represent esophagitis. 3. Infiltration and hyperdensity in the fat anterior and inferior to the pubic symphysis extending to the level of the penis similar to prior. No soft tissue gas or definitive fluid collection identified. Findings may be related to chronic inflammation, hematoma, infection or other soft tissue abnormality. Correlate clinically. 4. Mildly enlarged bilateral inguinal lymph nodes, likely reactive. 5. Bilateral renal cysts. No follow-up imaging necessary. Electronically Signed   By: Greig Pique M.D.   On: 12/12/2023 21:57    Microbiology: Recent Results (from the past 240 hours)  MRSA Next Gen by PCR, Nasal     Status: Abnormal   Collection Time: 12/14/23  9:05 PM   Specimen: Nasal Mucosa; Nasal Swab  Result Value Ref Range Status   MRSA by PCR Next Gen DETECTED (A) NOT DETECTED Final    Comment: RESULT CALLED TO, READ BACK BY AND VERIFIED WITH: B. PAUDEA, RN [phone] 12/15/23 BY ALONSO CORDS (NOTE) The GeneXpert MRSA Assay (FDA approved for NASAL specimens only), is one component of a comprehensive MRSA colonization surveillance program. It is not intended to diagnose MRSA infection nor to guide or monitor treatment for MRSA infections. Test performance is not FDA approved in patients less than 12 years old. Performed at [facility], [address] [place], [place] [address]   Culture, blood (x 2)     Status: None (Preliminary result)   Collection Time: 12/19/23 11:39 PM   Specimen: BLOOD RIGHT ARM  Result Value Ref Range Status   Specimen Description   Final    BLOOD RIGHT  ARM Performed at [facility], [address], [place], [place] [address]    Special Requests  Final    BOTTLES DRAWN AEROBIC AND ANAEROBIC Blood Culture results may not be optimal due to an inadequate volume of blood received in culture bottles Performed at [facility], [address] [place], [place] [address]    Culture   Final    NO GROWTH 3 DAYS Performed at [facility], [address] [place], [place] [address]    Report Status PENDING  Incomplete  Culture, blood (x 2)     Status: None (Preliminary result)   Collection Time: 12/19/23 11:39 PM   Specimen: BLOOD RIGHT ARM  Result Value Ref Range Status   Specimen Description   Final    BLOOD RIGHT ARM Performed at [facility], [address] [place], [place] [address]    Special Requests   Final    BOTTLES DRAWN AEROBIC AND ANAEROBIC Blood Culture adequate volume Performed at [facility], [address] [place], [place] [address]    Culture   Final    NO GROWTH 3 DAYS Performed at [facility], [address], [place], [place] [address]    Report Status PENDING  Incomplete     Labs: Basic Metabolic Panel: Recent Labs  Lab 12/19/23 0423 12/20/23 0451 12/21/23 0440 12/22/23 0429 12/23/23 0520  NA 132* 132* 131* 133* 132*  K 3.6 3.6 3.8 3.5 3.3*  CL 99 97* 96* 98 99  CO2 23 26 27 23 24   GLUCOSE 126* 120* 143* 124* 138*  BUN 7 7 5* <5* <5*  CREATININE 0.94 0.96 0.92 0.64 0.84  CALCIUM 7.3* 7.7* 7.8* 8.1* 7.9*  MG 1.9 2.1 2.0 1.9 1.9   Liver Function Tests: Recent Labs  Lab 12/18/23 0427 12/19/23 0423 12/20/23 0451 12/21/23 0440 12/22/23 0429  AST 18 17 19 21 18   ALT 14 11 13 13 12   ALKPHOS 53 60 77 77 81  BILITOT 1.2 0.8 0.9 0.8 0.7  PROT 6.6 5.6* 7.0 6.8 6.9  ALBUMIN 2.2* 1.9* 2.2* 2.2* 2.5*   No results for input(s): LIPASE, AMYLASE in the last 168 hours. No results for input(s): AMMONIA in the last 168 hours. CBC: Recent  Labs  Lab 12/19/23 2339 12/20/23 0451 12/21/23 0440 12/22/23 0429 12/23/23 0520  WBC 9.5 10.5 8.5 8.3 9.2  NEUTROABS 7.4 7.8* 6.3 6.2  --   HGB 11.8* 12.9* 12.1* 12.3* 12.1*  HCT 39.8 41.4 38.1* 39.2 37.5*  MCV 92.1 91.4 89.6 90.5 88.4  PLT 259 293 272 301 323   Cardiac Enzymes: No results for input(s): CKTOTAL, CKMB, CKMBINDEX, TROPONINI in the last 168 hours. BNP: BNP (last 3 results) No results for input(s): BNP in the last 8760 hours.  ProBNP (last 3 results) No results for input(s): PROBNP in the last 8760 hours.  CBG: No results for input(s): GLUCAP in the last 168 hours.     Signed:  Toribio Hummer MD.  [facility] 12/23/2023, 1:29 PM

## 2023-12-23 NOTE — Plan of Care (Signed)
  Problem: Health Behavior/Discharge Planning: Goal: Ability to manage health-related needs will improve Outcome: Progressing   Problem: Elimination: Goal: Will not experience complications related to bowel motility Outcome: Progressing Goal: Will not experience complications related to urinary retention Outcome: Progressing   Problem: Pain Managment: Goal: General experience of comfort will improve and/or be controlled Outcome: Progressing

## 2023-12-25 LAB — CULTURE, BLOOD (ROUTINE X 2)
Culture: NO GROWTH
Culture: NO GROWTH
Special Requests: ADEQUATE

## 2024-02-22 ENCOUNTER — Other Ambulatory Visit: Payer: Self-pay | Admitting: Medical Genetics

## 2024-02-24 ENCOUNTER — Other Ambulatory Visit (HOSPITAL_COMMUNITY)
Admission: RE | Admit: 2024-02-24 | Discharge: 2024-02-24 | Disposition: A | Discharge status: Home / Self Care | Payer: Self-pay | Source: Ambulatory Visit | Attending: Medical Genetics | Admitting: Medical Genetics

## 2024-03-01 ENCOUNTER — Other Ambulatory Visit: Payer: Self-pay | Admitting: General Surgery

## 2024-03-01 DIAGNOSIS — Z9049 Acquired absence of other specified parts of digestive tract: Secondary | ICD-10-CM

## 2024-03-03 ENCOUNTER — Inpatient Hospital Stay: Admission: RE | Admit: 2024-03-03 | Discharge: 2024-03-03 | Attending: General Surgery | Admitting: General Surgery

## 2024-03-03 ENCOUNTER — Encounter: Payer: Self-pay | Admitting: Radiology

## 2024-03-03 DIAGNOSIS — Z9049 Acquired absence of other specified parts of digestive tract: Secondary | ICD-10-CM

## 2024-03-03 MED ORDER — IOPAMIDOL (ISOVUE-300) INJECTION 61%
100.0000 mL | Freq: Once | INTRAVENOUS | Status: AC | PRN
  Administered 2024-03-03: 100 mL via INTRAVENOUS

## 2024-03-05 LAB — GENECONNECT MOLECULAR SCREEN: Genetic Analysis Overall Interpretation: NEGATIVE
